# Patient Record
Sex: Female | Born: 1940 | Race: White | Hispanic: No | Smoking: Never smoker
Health system: Southern US, Community
[De-identification: ages and names within clinical notes are randomized; demographics above are authoritative.]

## PROBLEM LIST (undated history)

## (undated) DIAGNOSIS — M797 Fibromyalgia: Secondary | ICD-10-CM

## (undated) DIAGNOSIS — D303 Benign neoplasm of bladder: Secondary | ICD-10-CM

## (undated) DIAGNOSIS — Z87442 Personal history of urinary calculi: Secondary | ICD-10-CM

## (undated) DIAGNOSIS — R519 Headache, unspecified: Secondary | ICD-10-CM

## (undated) DIAGNOSIS — R51 Headache: Secondary | ICD-10-CM

## (undated) DIAGNOSIS — H47099 Other disorders of optic nerve, not elsewhere classified, unspecified eye: Secondary | ICD-10-CM

## (undated) DIAGNOSIS — E559 Vitamin D deficiency, unspecified: Secondary | ICD-10-CM

## (undated) DIAGNOSIS — I10 Essential (primary) hypertension: Secondary | ICD-10-CM

## (undated) DIAGNOSIS — J45909 Unspecified asthma, uncomplicated: Secondary | ICD-10-CM

## (undated) DIAGNOSIS — M1990 Unspecified osteoarthritis, unspecified site: Secondary | ICD-10-CM

## (undated) DIAGNOSIS — K219 Gastro-esophageal reflux disease without esophagitis: Secondary | ICD-10-CM

## (undated) DIAGNOSIS — F329 Major depressive disorder, single episode, unspecified: Secondary | ICD-10-CM

## (undated) DIAGNOSIS — N200 Calculus of kidney: Secondary | ICD-10-CM

## (undated) DIAGNOSIS — K449 Diaphragmatic hernia without obstruction or gangrene: Secondary | ICD-10-CM

## (undated) DIAGNOSIS — B029 Zoster without complications: Secondary | ICD-10-CM

## (undated) DIAGNOSIS — G510 Bell's palsy: Secondary | ICD-10-CM

## (undated) DIAGNOSIS — F32A Depression, unspecified: Secondary | ICD-10-CM

## (undated) HISTORY — DX: Fibromyalgia: M79.7

## (undated) HISTORY — PX: CYST EXCISION: SHX5701

## (undated) HISTORY — DX: Gastro-esophageal reflux disease without esophagitis: K21.9

## (undated) HISTORY — PX: ESOPHAGOGASTRODUODENOSCOPY: SHX1529

## (undated) HISTORY — PX: COLONOSCOPY: SHX174

## (undated) HISTORY — DX: Headache, unspecified: R51.9

## (undated) HISTORY — DX: Other disorders of optic nerve, not elsewhere classified, unspecified eye: H47.099

## (undated) HISTORY — PX: KNEE SURGERY: SHX244

## (undated) HISTORY — DX: Diaphragmatic hernia without obstruction or gangrene: K44.9

## (undated) HISTORY — DX: Bell's palsy: G51.0

## (undated) HISTORY — DX: Hypercalcemia: E83.52

## (undated) HISTORY — PX: SIGMOIDOSCOPY: SHX6686

## (undated) HISTORY — PX: EYE SURGERY: SHX253

## (undated) HISTORY — PX: REFRACTIVE SURGERY: SHX103

## (undated) HISTORY — PX: APPENDECTOMY: SHX54

## (undated) HISTORY — DX: Headache: R51

## (undated) HISTORY — DX: Unspecified asthma, uncomplicated: J45.909

## (undated) NOTE — *Deleted (*Deleted)
07/06/2020 5:18 PM   Anne Steele [birth year] [id number]  Referring provider: Barbette Reichmann, MD [id number] [facility] [place],  [place] [address] No chief complaint on file.   HPI: Anne Steele is a 60 y.o. female who returns a 3 month follow up of urge incontinence and vaginal atrophy.   Dysuria Cystoscopy 03/2019 with Dr. Apolinar Junes was NED.    Urge incontinence During the last visit the patient stated that she had urinary incontinence for a few years.  She was having urge incontinence, but she was not having SUI.  She was wearing pads daily.  Her incontinence volume was large.   She was wearing one to pads pads/depends daily. She was drinking five glasses of water daily.   She was drinking 2 to 3 Dr. Art Buff daily.  She was drinking sweet tea as well.  She was not drinking alcoholic beverages daily.  Gemtesa 75 mg helped.    Today the patient is  experiencing urgency x *** (1-2), frequency x *** (4-7), not/is restricting fluids to avoid visits to the restroom ***, not/is engaging in toilet mapping, incontinence x *** (0-3) and nocturia x *** (0-3).   Her BP is ***.   Her PVR is ***.  Previous PVR 0 mL.   She is post menopausal.  Vaginal atrophy She is applying the vaginal cream as regular as she can***.      PMH: Past Medical History:  Diagnosis Date  . Arthritis   . Asthma   . Bell palsy 1973  . Bell's palsy   . Benign bladder tumor   . Depression   . Fibromyalgia   . GERD (gastroesophageal reflux disease)   . Head ache   . Hiatal hernia   . History of kidney stones   . Hypercalcemia   . Hypertension   . Kidney stones   . Optic nerve disease   . Shingles   . Vitamin D deficiency     Surgical History: Past Surgical History:  Procedure Laterality Date  . ABDOMINAL HYSTERECTOMY  1982  . APPENDECTOMY    . ARTERY BIOPSY N/A 02/24/2020   Procedure: BIOPSY TEMPORAL ARTERY;  Surgeon: Sung Amabile, DO;  Location: [facility] ORS;   Service: General;  Laterality: N/A;  . BLADDER TUMOR EXCISION  08/2018  . BREAST BIOPSY Right 03/04/2016   US biopsy/ INTRADUCTAL PAPILLOMA  . BREAST BIOPSY Left 03/04/2016   Fibroadenoma  . BREAST EXCISIONAL BIOPSY Left 03/05/2019   Inflamed sebaceous cyst of the left breast, excised by dr. Lemar Livings in office BENIGN EPIDERMAL INCLUSION CYST WITH POSSIBLE RUPTURE AND ASSOCIATED INFLAMMATORY RESPONSE  . CARPAL TUNNEL RELEASE Bilateral 2012  . CATARACT EXTRACTION Bilateral 2017  . CHOLECYSTECTOMY  2001  . COLONOSCOPY    . CYST EXCISION     FINGER  . CYSTOSCOPY W/ RETROGRADES Bilateral 08/29/2018   Procedure: CYSTOSCOPY WITH RETROGRADE PYELOGRAM;  Surgeon: Vanna Scotland, MD;  Location: [facility] ORS;  Service: Urology;  Laterality: Bilateral;  . CYSTOSCOPY WITH BIOPSY N/A 08/29/2018   Procedure: CYSTOSCOPY WITH Bladder BIOPSY;  Surgeon: Vanna Scotland, MD;  Location: [facility] ORS;  Service: Urology;  Laterality: N/A;  . ESOPHAGOGASTRODUODENOSCOPY    . EYE SURGERY    . KNEE SURGERY Left 2012  . REFRACTIVE SURGERY Bilateral   . SIGMOIDOSCOPY      Home Medications:  Allergies as of 07/06/2020      Reactions   Ivp Dye [iodinated Diagnostic Agents] Anaphylaxis   Penicillins Shortness Of Breath   Patient  states it "brings her asthma back on"   Duloxetine Other (See Comments)   nightmares   Iodine Itching   Latanoprost    Blood shot eyes   Tetracyclines & Related Swelling   Trusopt [dorzolamide Hcl]    Zocor [simvastatin]    MUSCLE CRAMPS   Zovirax [acyclovir]    Betadine [povidone Iodine] Rash   Macrobid [nitrofurantoin] Rash   Sulfa Antibiotics Rash   Tape Rash      Medication List       Accurate as of July 05, 2020  5:18 PM. If you have any questions, ask your nurse or doctor.        acetaminophen 500 MG tablet Commonly known as: TYLENOL Take 1,000 mg by mouth 2 (two) times daily.   allopurinol 100 MG tablet Commonly known as: ZYLOPRIM Take 100 mg by mouth daily.    busPIRone 10 MG tablet Commonly known as: BUSPAR Take 10 mg by mouth 2 (two) times daily.   cyanocobalamin 1000 MCG tablet Take 100 mcg by mouth daily.   enalapril 20 MG tablet Commonly known as: VASOTEC Take 20 mg by mouth daily.   fluticasone 50 MCG/ACT nasal spray Commonly known as: FLONASE Place 2 sprays into both nostrils daily.   Gemtesa 75 MG Tabs Generic drug: Vibegron Take 75 mg by mouth daily.   Gemtesa 75 MG Tabs Generic drug: Vibegron Take 75 mg by mouth daily.   HYDROcodone-acetaminophen 5-325 MG tablet Commonly known as: Norco Take 1 tablet by mouth every 6 (six) hours as needed for up to 6 doses for moderate pain.   ibuprofen 400 MG tablet Commonly known as: ADVIL Take 1 tablet (400 mg total) by mouth every 8 (eight) hours as needed for mild pain or moderate pain.   loratadine 10 MG tablet Commonly known as: CLARITIN Take 10 mg by mouth at bedtime.   magnesium oxide 400 (241.3 Mg) MG tablet Commonly known as: MAG-OX Take 400 mg by mouth daily.   metoprolol succinate 25 MG 24 hr tablet Commonly known as: TOPROL-XL Take 25 mg by mouth daily.   omeprazole 20 MG capsule Commonly known as: PRILOSEC Take 20 mg by mouth 2 (two) times daily before a meal.   oxybutynin 5 MG tablet Commonly known as: DITROPAN Take 1 tablet by mouth daily as needed.   Premarin vaginal cream Generic drug: conjugated estrogens USE PEA SIZED AMOUNT VAGINALLY ON MONDAY, WEDNESDAY AND FRIDAY ONLY BEFORE BEDTIME.   Systane Nighttime Oint Apply 1 application to eye at bedtime. Both eyes   tiZANidine 4 MG tablet Commonly known as: ZANAFLEX Take 4 mg by mouth every 8 (eight) hours as needed for muscle spasms.   topiramate 25 MG tablet Commonly known as: TOPAMAX Take 25 mg by mouth daily.   Travoprost (BAK Free) 0.004 % Soln ophthalmic solution Commonly known as: TRAVATAN Place 1 drop into both eyes at bedtime.   vitamin E 180 MG (400 UNITS) capsule Generic drug:  vitamin E Take 400 Units by mouth daily.       Allergies:  Allergies  Allergen Reactions  . Ivp Dye [Iodinated Diagnostic Agents] Anaphylaxis  . Penicillins Shortness Of Breath    Patient states it "brings her asthma back on"  . Duloxetine Other (See Comments)    nightmares  . Iodine Itching  . Latanoprost     Blood shot eyes  . Tetracyclines & Related Swelling  . Trusopt [Dorzolamide Hcl]   . Zocor [Simvastatin]     MUSCLE CRAMPS  .  Zovirax [Acyclovir]   . Betadine [Povidone Iodine] Rash  . Macrobid [Nitrofurantoin] Rash  . Sulfa Antibiotics Rash  . Tape Rash    Family History: Family History  Problem Relation Age of Onset  . Leukemia Mother   . Diabetes Father   . Breast cancer Sister   . Brain cancer Brother   . Diabetes Sister   . Diabetes Brother   . Prostate cancer Neg Hx   . Kidney cancer Neg Hx   . Bladder Cancer Neg Hx     Social History:  reports that she has never smoked. She has never used smokeless tobacco. She reports that she does not drink alcohol and does not use drugs.   Physical Exam: There were no vitals taken for this visit.  Constitutional:  Alert and oriented, No acute distress. HEENT: [place] AT, moist mucus membranes.  Trachea midline, no masses. Cardiovascular: No clubbing, cyanosis, or edema. Respiratory: Normal respiratory effort, no increased work of breathing. GI: Abdomen is soft, nontender, nondistended, no abdominal masses GU: No CVA tenderness Lymph: No cervical or inguinal lymphadenopathy. Skin: No rashes, bruises or suspicious lesions. Neurologic: Grossly intact, no focal deficits, moving all 4 extremities. Psychiatric: Normal mood and affect.  Laboratory Data:  Lab Results  Component Value Date   CREATININE 1.10 (H) 02/21/2020    No results found for: PSA  No results found for: TESTOSTERONE  No results found for: HGBA1C  Urinalysis   Pertinent Imaging: *** No results found for this or any previous visit.  No  results found for this or any previous visit.  No results found for this or any previous visit.  No results found for this or any previous visit.  Results for orders placed during the hospital encounter of 05/30/18  US RENAL  Narrative CLINICAL DATA:  Current history of BILATERAL nephrolithiasis. Known BILATERAL renal cysts.  EXAM: RENAL / URINARY TRACT ULTRASOUND COMPLETE  COMPARISON:  Lumbar MRI 02/15/2018. CT abdomen and pelvis 10/21/2016, 08/26/2015 and earlier. Urinary tract ultrasound 07/25/2014.  FINDINGS: Right Kidney:  Length: Approximately 9.6 cm. No hydronephrosis. 7 mm shadowing calculus in an UPPER pole calyx. Adjacent anechoic masses involving the mid kidney measuring approximately 1.8 cm and 1.1 cm respectively. Anechoic mass arising from the LOWER pole measuring approximately 0.9 cm. No solid masses.  Left Kidney:  Length: Approximately 9.9 cm. No hydronephrosis. No visible shadowing calculi. Multiple anechoic masses, the largest arising from the mid kidney measuring approximately 4.1 cm and from the UPPER pole measuring approximately 2.8 cm.  Bladder:  Relatively decompressed. BILATERAL ureteral jets visible at Doppler evaluation. Layering debris within the bladder.  IMPRESSION: 1. Multiple BILATERAL renal cysts as noted on prior imaging. 2. Non-obstructing 7 mm calculus in an UPPER pole calyx of the RIGHT kidney. Previously identified LEFT renal calculi are not visible on today's ultrasound. 3. Layering debris in the urinary bladder, possibly indicating infection. Please correlate with urinalysis.   Electronically Signed By: Hulan Saas M.D. On: 05/30/2018 16:37  No results found for this or any previous visit.  No results found for this or any previous visit.  Results for orders placed during the hospital encounter of 10/21/16  CT RENAL STONE STUDY  Narrative CLINICAL DATA:  Left flank pain.  Dysuria.  EXAM: CT ABDOMEN AND  PELVIS WITHOUT CONTRAST  TECHNIQUE: Multidetector CT imaging of the abdomen and pelvis was performed following the standard protocol without IV contrast.  COMPARISON:  08/26/2015  FINDINGS: Lower chest: Clear lung bases. Normal heart size without  pericardial or pleural effusion.  Hepatobiliary: Mild hepatic steatosis. Caudate lobe enlargement, without specific evidence of cirrhosis. Cholecystectomy, without biliary ductal dilatation.  Pancreas: Normal, without mass or ductal dilatation.  Spleen: Normal in size, without focal abnormality.  Adrenals/Urinary Tract: Normal adrenal glands. Bilateral renal collecting system calculi, including an 8 mm stone in the lower pole left kidney. No hydronephrosis. No hydroureter or ureteric calculi. No bladder calculi.  An upper pole left renal lesion measures greater than fluid density and 2.4 cm on image 22/ series 2. This lesion was similar in size and fluid density on the prior, favoring a complex cyst today.  An interpolar left renal hyperattenuating lesion is similar at 1.3 cm. 7 mm lower pole left renal hyperattenuating lesion is also similar. Low-density left renal lesions are also identified and likely cysts. Minimal calcification associated with upper pole left renal lesions, favoring complexity. Similar.  Stomach/Bowel: Normal stomach, without wall thickening. Colonic stool burden suggests constipation. Normal terminal ileum. Normal small bowel.  Vascular/Lymphatic: Aortic and branch vessel atherosclerosis. No abdominopelvic adenopathy.  Reproductive: Hysterectomy.  No adnexal mass.  Other: No significant free fluid.  Musculoskeletal: Lumbosacral spondylosis with advanced degenerative disc disease at the lumbosacral junction.  IMPRESSION: 1. Bilateral nephrolithiasis, without obstructive uropathy. 2.  Possible constipation. 3. Multiple renal lesions which are likely cysts and complex cysts. although these are  incompletely characterized on this noncontrast exam, relative stability favors benign or indolent lesions. 4. Hepatic steatosis. 5.  Aortic atherosclerosis.   Electronically Signed By: Jeronimo Greaves M.D. On: 10/21/2016 12:16   Assessment & Plan:    1. Urge incontinence  2. Vaginal atrophy   No follow-ups on file.  [facility] [address] [place], [place] [address] [phone]  I, Theador Hawthorne, am acting as a Neurosurgeon for [employer],  {Add [employer] Statement}

---

## 1971-09-20 DIAGNOSIS — G510 Bell's palsy: Secondary | ICD-10-CM

## 1971-09-20 HISTORY — DX: Bell's palsy: G51.0

## 1980-09-19 HISTORY — PX: ABDOMINAL HYSTERECTOMY: SHX81

## 1999-09-20 HISTORY — PX: CHOLECYSTECTOMY: SHX55

## 2004-07-29 ENCOUNTER — Ambulatory Visit: Payer: Self-pay | Admitting: Urology

## 2004-09-07 ENCOUNTER — Ambulatory Visit: Payer: Self-pay | Admitting: Internal Medicine

## 2005-02-11 ENCOUNTER — Ambulatory Visit: Payer: Self-pay | Admitting: Unknown Physician Specialty

## 2005-06-24 ENCOUNTER — Ambulatory Visit: Payer: Self-pay | Admitting: Urology

## 2005-09-14 ENCOUNTER — Ambulatory Visit: Payer: Self-pay | Admitting: Internal Medicine

## 2005-09-15 ENCOUNTER — Ambulatory Visit: Payer: Self-pay | Admitting: Rheumatology

## 2006-01-06 ENCOUNTER — Ambulatory Visit: Payer: Self-pay | Admitting: Unknown Physician Specialty

## 2006-01-13 ENCOUNTER — Other Ambulatory Visit: Payer: Self-pay

## 2006-01-13 ENCOUNTER — Ambulatory Visit: Payer: Self-pay | Admitting: Unknown Physician Specialty

## 2006-05-08 ENCOUNTER — Ambulatory Visit: Payer: Self-pay

## 2006-06-23 ENCOUNTER — Ambulatory Visit: Payer: Self-pay | Admitting: Unknown Physician Specialty

## 2006-06-23 ENCOUNTER — Other Ambulatory Visit: Payer: Self-pay

## 2006-06-29 ENCOUNTER — Ambulatory Visit: Payer: Self-pay | Admitting: Unknown Physician Specialty

## 2006-09-18 ENCOUNTER — Ambulatory Visit: Payer: Self-pay | Admitting: Internal Medicine

## 2007-05-04 ENCOUNTER — Ambulatory Visit: Payer: Self-pay

## 2007-10-18 ENCOUNTER — Ambulatory Visit: Payer: Self-pay | Admitting: Internal Medicine

## 2008-03-12 ENCOUNTER — Ambulatory Visit: Payer: Self-pay | Admitting: Urology

## 2008-10-30 ENCOUNTER — Ambulatory Visit: Payer: Self-pay | Admitting: Internal Medicine

## 2008-11-03 ENCOUNTER — Ambulatory Visit: Payer: Self-pay | Admitting: Internal Medicine

## 2009-01-13 ENCOUNTER — Ambulatory Visit: Payer: Self-pay | Admitting: Rheumatology

## 2009-04-24 ENCOUNTER — Emergency Department: Payer: Self-pay | Admitting: Emergency Medicine

## 2009-04-29 ENCOUNTER — Ambulatory Visit: Payer: Self-pay | Admitting: Unknown Physician Specialty

## 2009-05-04 ENCOUNTER — Ambulatory Visit: Payer: Self-pay | Admitting: Internal Medicine

## 2009-12-15 ENCOUNTER — Ambulatory Visit: Payer: Self-pay | Admitting: Urology

## 2010-03-25 ENCOUNTER — Ambulatory Visit: Payer: Self-pay | Admitting: Internal Medicine

## 2010-09-19 HISTORY — PX: CARPAL TUNNEL RELEASE: SHX101

## 2010-09-19 HISTORY — PX: KNEE SURGERY: SHX244

## 2010-12-31 ENCOUNTER — Ambulatory Visit: Payer: Self-pay

## 2011-01-07 ENCOUNTER — Ambulatory Visit: Payer: Self-pay | Admitting: Unknown Physician Specialty

## 2011-03-28 ENCOUNTER — Ambulatory Visit: Payer: Self-pay | Admitting: Internal Medicine

## 2011-11-16 DIAGNOSIS — E559 Vitamin D deficiency, unspecified: Secondary | ICD-10-CM | POA: Diagnosis not present

## 2011-11-16 DIAGNOSIS — I10 Essential (primary) hypertension: Secondary | ICD-10-CM | POA: Diagnosis not present

## 2011-11-16 DIAGNOSIS — E785 Hyperlipidemia, unspecified: Secondary | ICD-10-CM | POA: Diagnosis not present

## 2011-12-02 DIAGNOSIS — H35389 Toxic maculopathy, unspecified eye: Secondary | ICD-10-CM | POA: Diagnosis not present

## 2011-12-20 DIAGNOSIS — E785 Hyperlipidemia, unspecified: Secondary | ICD-10-CM | POA: Diagnosis not present

## 2011-12-20 DIAGNOSIS — Z0000 Encounter for general adult medical examination without abnormal findings: Secondary | ICD-10-CM | POA: Diagnosis not present

## 2011-12-20 DIAGNOSIS — Z79899 Other long term (current) drug therapy: Secondary | ICD-10-CM | POA: Diagnosis not present

## 2011-12-20 DIAGNOSIS — E119 Type 2 diabetes mellitus without complications: Secondary | ICD-10-CM | POA: Diagnosis not present

## 2011-12-20 DIAGNOSIS — F4320 Adjustment disorder, unspecified: Secondary | ICD-10-CM | POA: Diagnosis not present

## 2011-12-20 DIAGNOSIS — I10 Essential (primary) hypertension: Secondary | ICD-10-CM | POA: Diagnosis not present

## 2012-01-30 DIAGNOSIS — I10 Essential (primary) hypertension: Secondary | ICD-10-CM | POA: Diagnosis not present

## 2012-01-30 DIAGNOSIS — E119 Type 2 diabetes mellitus without complications: Secondary | ICD-10-CM | POA: Diagnosis not present

## 2012-01-30 DIAGNOSIS — Z79899 Other long term (current) drug therapy: Secondary | ICD-10-CM | POA: Diagnosis not present

## 2012-01-30 DIAGNOSIS — M25519 Pain in unspecified shoulder: Secondary | ICD-10-CM | POA: Diagnosis not present

## 2012-02-09 DIAGNOSIS — E782 Mixed hyperlipidemia: Secondary | ICD-10-CM | POA: Diagnosis not present

## 2012-02-09 DIAGNOSIS — R9431 Abnormal electrocardiogram [ECG] [EKG]: Secondary | ICD-10-CM | POA: Diagnosis not present

## 2012-02-09 DIAGNOSIS — I209 Angina pectoris, unspecified: Secondary | ICD-10-CM | POA: Diagnosis not present

## 2012-02-09 DIAGNOSIS — I10 Essential (primary) hypertension: Secondary | ICD-10-CM | POA: Diagnosis not present

## 2012-02-16 DIAGNOSIS — I359 Nonrheumatic aortic valve disorder, unspecified: Secondary | ICD-10-CM | POA: Diagnosis not present

## 2012-02-16 DIAGNOSIS — I209 Angina pectoris, unspecified: Secondary | ICD-10-CM | POA: Diagnosis not present

## 2012-02-23 DIAGNOSIS — I10 Essential (primary) hypertension: Secondary | ICD-10-CM | POA: Diagnosis not present

## 2012-02-23 DIAGNOSIS — I495 Sick sinus syndrome: Secondary | ICD-10-CM | POA: Diagnosis not present

## 2012-02-23 DIAGNOSIS — E782 Mixed hyperlipidemia: Secondary | ICD-10-CM | POA: Diagnosis not present

## 2012-02-23 DIAGNOSIS — I059 Rheumatic mitral valve disease, unspecified: Secondary | ICD-10-CM | POA: Diagnosis not present

## 2012-03-28 ENCOUNTER — Ambulatory Visit: Payer: Self-pay | Admitting: Internal Medicine

## 2012-03-28 DIAGNOSIS — Z1231 Encounter for screening mammogram for malignant neoplasm of breast: Secondary | ICD-10-CM | POA: Diagnosis not present

## 2012-04-05 DIAGNOSIS — I059 Rheumatic mitral valve disease, unspecified: Secondary | ICD-10-CM | POA: Diagnosis not present

## 2012-04-05 DIAGNOSIS — E782 Mixed hyperlipidemia: Secondary | ICD-10-CM | POA: Diagnosis not present

## 2012-04-05 DIAGNOSIS — I409 Acute myocarditis, unspecified: Secondary | ICD-10-CM | POA: Diagnosis not present

## 2012-04-18 DIAGNOSIS — M1710 Unilateral primary osteoarthritis, unspecified knee: Secondary | ICD-10-CM | POA: Diagnosis not present

## 2012-05-09 DIAGNOSIS — N952 Postmenopausal atrophic vaginitis: Secondary | ICD-10-CM | POA: Diagnosis not present

## 2012-05-09 DIAGNOSIS — R300 Dysuria: Secondary | ICD-10-CM | POA: Diagnosis not present

## 2012-05-09 DIAGNOSIS — N281 Cyst of kidney, acquired: Secondary | ICD-10-CM | POA: Diagnosis not present

## 2012-05-16 ENCOUNTER — Ambulatory Visit: Payer: Self-pay | Admitting: Urology

## 2012-05-16 DIAGNOSIS — N200 Calculus of kidney: Secondary | ICD-10-CM | POA: Diagnosis not present

## 2012-05-16 DIAGNOSIS — N281 Cyst of kidney, acquired: Secondary | ICD-10-CM | POA: Diagnosis not present

## 2012-05-16 DIAGNOSIS — Q619 Cystic kidney disease, unspecified: Secondary | ICD-10-CM | POA: Diagnosis not present

## 2012-05-23 DIAGNOSIS — N952 Postmenopausal atrophic vaginitis: Secondary | ICD-10-CM | POA: Diagnosis not present

## 2012-05-23 DIAGNOSIS — R300 Dysuria: Secondary | ICD-10-CM | POA: Diagnosis not present

## 2012-05-23 DIAGNOSIS — N281 Cyst of kidney, acquired: Secondary | ICD-10-CM | POA: Diagnosis not present

## 2012-06-18 DIAGNOSIS — Z23 Encounter for immunization: Secondary | ICD-10-CM | POA: Diagnosis not present

## 2012-06-21 DIAGNOSIS — M1710 Unilateral primary osteoarthritis, unspecified knee: Secondary | ICD-10-CM | POA: Diagnosis not present

## 2012-06-22 DIAGNOSIS — Z79899 Other long term (current) drug therapy: Secondary | ICD-10-CM | POA: Diagnosis not present

## 2012-06-22 DIAGNOSIS — E785 Hyperlipidemia, unspecified: Secondary | ICD-10-CM | POA: Diagnosis not present

## 2012-06-22 DIAGNOSIS — E119 Type 2 diabetes mellitus without complications: Secondary | ICD-10-CM | POA: Diagnosis not present

## 2012-06-27 DIAGNOSIS — R12 Heartburn: Secondary | ICD-10-CM | POA: Diagnosis not present

## 2012-06-27 DIAGNOSIS — J019 Acute sinusitis, unspecified: Secondary | ICD-10-CM | POA: Diagnosis not present

## 2012-06-27 DIAGNOSIS — F329 Major depressive disorder, single episode, unspecified: Secondary | ICD-10-CM | POA: Diagnosis not present

## 2012-06-27 DIAGNOSIS — I10 Essential (primary) hypertension: Secondary | ICD-10-CM | POA: Diagnosis not present

## 2012-07-02 DIAGNOSIS — H04129 Dry eye syndrome of unspecified lacrimal gland: Secondary | ICD-10-CM | POA: Diagnosis not present

## 2012-07-02 DIAGNOSIS — H35389 Toxic maculopathy, unspecified eye: Secondary | ICD-10-CM | POA: Diagnosis not present

## 2012-09-04 DIAGNOSIS — M79609 Pain in unspecified limb: Secondary | ICD-10-CM | POA: Diagnosis not present

## 2012-09-04 DIAGNOSIS — E1149 Type 2 diabetes mellitus with other diabetic neurological complication: Secondary | ICD-10-CM | POA: Diagnosis not present

## 2012-09-04 DIAGNOSIS — E1142 Type 2 diabetes mellitus with diabetic polyneuropathy: Secondary | ICD-10-CM | POA: Diagnosis not present

## 2012-10-02 DIAGNOSIS — E1142 Type 2 diabetes mellitus with diabetic polyneuropathy: Secondary | ICD-10-CM | POA: Diagnosis not present

## 2012-10-02 DIAGNOSIS — Z713 Dietary counseling and surveillance: Secondary | ICD-10-CM | POA: Diagnosis not present

## 2012-10-02 DIAGNOSIS — I471 Supraventricular tachycardia: Secondary | ICD-10-CM | POA: Diagnosis not present

## 2012-10-02 DIAGNOSIS — M79609 Pain in unspecified limb: Secondary | ICD-10-CM | POA: Diagnosis not present

## 2012-10-02 DIAGNOSIS — R609 Edema, unspecified: Secondary | ICD-10-CM | POA: Diagnosis not present

## 2012-10-02 DIAGNOSIS — I10 Essential (primary) hypertension: Secondary | ICD-10-CM | POA: Diagnosis not present

## 2012-10-24 DIAGNOSIS — D230 Other benign neoplasm of skin of lip: Secondary | ICD-10-CM | POA: Diagnosis not present

## 2012-10-24 DIAGNOSIS — D2330 Other benign neoplasm of skin of unspecified part of face: Secondary | ICD-10-CM | POA: Diagnosis not present

## 2012-10-24 DIAGNOSIS — L821 Other seborrheic keratosis: Secondary | ICD-10-CM | POA: Diagnosis not present

## 2012-10-24 DIAGNOSIS — L723 Sebaceous cyst: Secondary | ICD-10-CM | POA: Diagnosis not present

## 2012-10-24 DIAGNOSIS — L739 Follicular disorder, unspecified: Secondary | ICD-10-CM | POA: Diagnosis not present

## 2012-11-07 DIAGNOSIS — D485 Neoplasm of uncertain behavior of skin: Secondary | ICD-10-CM | POA: Diagnosis not present

## 2012-11-07 DIAGNOSIS — L570 Actinic keratosis: Secondary | ICD-10-CM | POA: Diagnosis not present

## 2012-11-08 DIAGNOSIS — R002 Palpitations: Secondary | ICD-10-CM | POA: Diagnosis not present

## 2012-11-08 DIAGNOSIS — I10 Essential (primary) hypertension: Secondary | ICD-10-CM | POA: Diagnosis not present

## 2012-11-08 DIAGNOSIS — E785 Hyperlipidemia, unspecified: Secondary | ICD-10-CM | POA: Diagnosis not present

## 2012-11-08 DIAGNOSIS — I471 Supraventricular tachycardia: Secondary | ICD-10-CM | POA: Diagnosis not present

## 2012-12-05 DIAGNOSIS — D485 Neoplasm of uncertain behavior of skin: Secondary | ICD-10-CM | POA: Diagnosis not present

## 2012-12-18 DIAGNOSIS — D485 Neoplasm of uncertain behavior of skin: Secondary | ICD-10-CM | POA: Diagnosis not present

## 2012-12-18 DIAGNOSIS — L909 Atrophic disorder of skin, unspecified: Secondary | ICD-10-CM | POA: Diagnosis not present

## 2012-12-18 DIAGNOSIS — L919 Hypertrophic disorder of the skin, unspecified: Secondary | ICD-10-CM | POA: Diagnosis not present

## 2012-12-19 DIAGNOSIS — I10 Essential (primary) hypertension: Secondary | ICD-10-CM | POA: Diagnosis not present

## 2012-12-31 DIAGNOSIS — H35389 Toxic maculopathy, unspecified eye: Secondary | ICD-10-CM | POA: Diagnosis not present

## 2013-01-15 DIAGNOSIS — I10 Essential (primary) hypertension: Secondary | ICD-10-CM | POA: Diagnosis not present

## 2013-01-15 DIAGNOSIS — Z0000 Encounter for general adult medical examination without abnormal findings: Secondary | ICD-10-CM | POA: Diagnosis not present

## 2013-03-29 ENCOUNTER — Ambulatory Visit: Payer: Self-pay

## 2013-03-29 DIAGNOSIS — Z1231 Encounter for screening mammogram for malignant neoplasm of breast: Secondary | ICD-10-CM | POA: Diagnosis not present

## 2013-03-29 DIAGNOSIS — Z803 Family history of malignant neoplasm of breast: Secondary | ICD-10-CM | POA: Diagnosis not present

## 2013-04-03 DIAGNOSIS — M659 Synovitis and tenosynovitis, unspecified: Secondary | ICD-10-CM | POA: Diagnosis not present

## 2013-04-03 DIAGNOSIS — M79609 Pain in unspecified limb: Secondary | ICD-10-CM | POA: Diagnosis not present

## 2013-04-03 DIAGNOSIS — Q6689 Other  specified congenital deformities of feet: Secondary | ICD-10-CM | POA: Diagnosis not present

## 2013-05-07 DIAGNOSIS — I10 Essential (primary) hypertension: Secondary | ICD-10-CM | POA: Diagnosis not present

## 2013-05-07 DIAGNOSIS — E785 Hyperlipidemia, unspecified: Secondary | ICD-10-CM | POA: Diagnosis not present

## 2013-05-07 DIAGNOSIS — I059 Rheumatic mitral valve disease, unspecified: Secondary | ICD-10-CM | POA: Diagnosis not present

## 2013-06-13 DIAGNOSIS — K589 Irritable bowel syndrome without diarrhea: Secondary | ICD-10-CM | POA: Diagnosis not present

## 2013-06-13 DIAGNOSIS — R141 Gas pain: Secondary | ICD-10-CM | POA: Diagnosis not present

## 2013-06-19 DIAGNOSIS — R197 Diarrhea, unspecified: Secondary | ICD-10-CM | POA: Diagnosis not present

## 2013-06-19 DIAGNOSIS — L570 Actinic keratosis: Secondary | ICD-10-CM | POA: Diagnosis not present

## 2013-06-19 DIAGNOSIS — R109 Unspecified abdominal pain: Secondary | ICD-10-CM | POA: Diagnosis not present

## 2013-06-19 DIAGNOSIS — D2110 Benign neoplasm of connective and other soft tissue of unspecified upper limb, including shoulder: Secondary | ICD-10-CM | POA: Diagnosis not present

## 2013-06-19 DIAGNOSIS — D485 Neoplasm of uncertain behavior of skin: Secondary | ICD-10-CM | POA: Diagnosis not present

## 2013-06-19 DIAGNOSIS — K589 Irritable bowel syndrome without diarrhea: Secondary | ICD-10-CM | POA: Diagnosis not present

## 2013-06-19 DIAGNOSIS — Z1283 Encounter for screening for malignant neoplasm of skin: Secondary | ICD-10-CM | POA: Diagnosis not present

## 2013-06-19 DIAGNOSIS — D2360 Other benign neoplasm of skin of unspecified upper limb, including shoulder: Secondary | ICD-10-CM | POA: Diagnosis not present

## 2013-06-19 DIAGNOSIS — L821 Other seborrheic keratosis: Secondary | ICD-10-CM | POA: Diagnosis not present

## 2013-07-10 DIAGNOSIS — Z0000 Encounter for general adult medical examination without abnormal findings: Secondary | ICD-10-CM | POA: Diagnosis not present

## 2013-07-10 DIAGNOSIS — I10 Essential (primary) hypertension: Secondary | ICD-10-CM | POA: Diagnosis not present

## 2013-07-10 DIAGNOSIS — E119 Type 2 diabetes mellitus without complications: Secondary | ICD-10-CM | POA: Diagnosis not present

## 2013-07-10 DIAGNOSIS — Z23 Encounter for immunization: Secondary | ICD-10-CM | POA: Diagnosis not present

## 2013-07-17 DIAGNOSIS — K589 Irritable bowel syndrome without diarrhea: Secondary | ICD-10-CM | POA: Diagnosis not present

## 2013-07-17 DIAGNOSIS — G609 Hereditary and idiopathic neuropathy, unspecified: Secondary | ICD-10-CM | POA: Diagnosis not present

## 2013-07-17 DIAGNOSIS — I10 Essential (primary) hypertension: Secondary | ICD-10-CM | POA: Diagnosis not present

## 2013-07-17 DIAGNOSIS — F329 Major depressive disorder, single episode, unspecified: Secondary | ICD-10-CM | POA: Diagnosis not present

## 2013-07-19 DIAGNOSIS — N200 Calculus of kidney: Secondary | ICD-10-CM | POA: Diagnosis not present

## 2013-07-19 DIAGNOSIS — R109 Unspecified abdominal pain: Secondary | ICD-10-CM | POA: Diagnosis not present

## 2013-08-01 ENCOUNTER — Ambulatory Visit: Payer: Self-pay | Admitting: Urology

## 2013-08-01 DIAGNOSIS — N200 Calculus of kidney: Secondary | ICD-10-CM | POA: Diagnosis not present

## 2013-08-01 DIAGNOSIS — R109 Unspecified abdominal pain: Secondary | ICD-10-CM | POA: Diagnosis not present

## 2013-08-05 DIAGNOSIS — R079 Chest pain, unspecified: Secondary | ICD-10-CM | POA: Diagnosis not present

## 2013-08-07 DIAGNOSIS — N289 Disorder of kidney and ureter, unspecified: Secondary | ICD-10-CM | POA: Diagnosis not present

## 2013-08-08 DIAGNOSIS — I059 Rheumatic mitral valve disease, unspecified: Secondary | ICD-10-CM | POA: Diagnosis not present

## 2013-08-08 DIAGNOSIS — I209 Angina pectoris, unspecified: Secondary | ICD-10-CM | POA: Diagnosis not present

## 2013-08-08 DIAGNOSIS — R0602 Shortness of breath: Secondary | ICD-10-CM | POA: Diagnosis not present

## 2013-08-08 DIAGNOSIS — I119 Hypertensive heart disease without heart failure: Secondary | ICD-10-CM | POA: Diagnosis not present

## 2013-08-14 DIAGNOSIS — I209 Angina pectoris, unspecified: Secondary | ICD-10-CM | POA: Diagnosis not present

## 2013-08-19 DIAGNOSIS — I517 Cardiomegaly: Secondary | ICD-10-CM | POA: Diagnosis not present

## 2013-08-19 DIAGNOSIS — R9389 Abnormal findings on diagnostic imaging of other specified body structures: Secondary | ICD-10-CM | POA: Diagnosis not present

## 2013-08-19 DIAGNOSIS — I119 Hypertensive heart disease without heart failure: Secondary | ICD-10-CM | POA: Diagnosis not present

## 2013-08-19 DIAGNOSIS — I059 Rheumatic mitral valve disease, unspecified: Secondary | ICD-10-CM | POA: Diagnosis not present

## 2013-08-21 ENCOUNTER — Ambulatory Visit: Payer: Self-pay | Admitting: Urology

## 2013-08-21 DIAGNOSIS — N289 Disorder of kidney and ureter, unspecified: Secondary | ICD-10-CM | POA: Diagnosis not present

## 2013-08-21 DIAGNOSIS — N281 Cyst of kidney, acquired: Secondary | ICD-10-CM | POA: Diagnosis not present

## 2013-08-21 DIAGNOSIS — Q619 Cystic kidney disease, unspecified: Secondary | ICD-10-CM | POA: Diagnosis not present

## 2013-08-21 DIAGNOSIS — Z9089 Acquired absence of other organs: Secondary | ICD-10-CM | POA: Diagnosis not present

## 2013-08-21 LAB — CREATININE, SERUM
Creatinine: 1.21 mg/dL (ref 0.60–1.30)
EGFR (Non-African Amer.): 45 — ABNORMAL LOW

## 2013-08-22 DIAGNOSIS — Q619 Cystic kidney disease, unspecified: Secondary | ICD-10-CM | POA: Diagnosis not present

## 2013-08-22 DIAGNOSIS — R300 Dysuria: Secondary | ICD-10-CM | POA: Diagnosis not present

## 2013-08-30 DIAGNOSIS — E559 Vitamin D deficiency, unspecified: Secondary | ICD-10-CM | POA: Diagnosis not present

## 2013-09-25 DIAGNOSIS — E8941 Symptomatic postprocedural ovarian failure: Secondary | ICD-10-CM | POA: Diagnosis not present

## 2013-10-01 DIAGNOSIS — Z86010 Personal history of colonic polyps: Secondary | ICD-10-CM | POA: Diagnosis not present

## 2013-10-01 DIAGNOSIS — K589 Irritable bowel syndrome without diarrhea: Secondary | ICD-10-CM | POA: Diagnosis not present

## 2013-12-23 DIAGNOSIS — E782 Mixed hyperlipidemia: Secondary | ICD-10-CM | POA: Diagnosis not present

## 2013-12-23 DIAGNOSIS — I119 Hypertensive heart disease without heart failure: Secondary | ICD-10-CM | POA: Diagnosis not present

## 2013-12-23 DIAGNOSIS — I059 Rheumatic mitral valve disease, unspecified: Secondary | ICD-10-CM | POA: Diagnosis not present

## 2013-12-23 DIAGNOSIS — R1084 Generalized abdominal pain: Secondary | ICD-10-CM | POA: Diagnosis not present

## 2013-12-31 DIAGNOSIS — M545 Low back pain, unspecified: Secondary | ICD-10-CM | POA: Diagnosis not present

## 2013-12-31 DIAGNOSIS — N952 Postmenopausal atrophic vaginitis: Secondary | ICD-10-CM | POA: Diagnosis not present

## 2013-12-31 DIAGNOSIS — N281 Cyst of kidney, acquired: Secondary | ICD-10-CM | POA: Diagnosis not present

## 2013-12-31 DIAGNOSIS — N3941 Urge incontinence: Secondary | ICD-10-CM | POA: Diagnosis not present

## 2014-02-14 ENCOUNTER — Ambulatory Visit: Payer: Self-pay | Admitting: Unknown Physician Specialty

## 2014-02-14 DIAGNOSIS — Z91041 Radiographic dye allergy status: Secondary | ICD-10-CM | POA: Diagnosis not present

## 2014-02-14 DIAGNOSIS — M1990 Unspecified osteoarthritis, unspecified site: Secondary | ICD-10-CM | POA: Diagnosis not present

## 2014-02-14 DIAGNOSIS — K589 Irritable bowel syndrome without diarrhea: Secondary | ICD-10-CM | POA: Diagnosis not present

## 2014-02-14 DIAGNOSIS — E119 Type 2 diabetes mellitus without complications: Secondary | ICD-10-CM | POA: Diagnosis not present

## 2014-02-14 DIAGNOSIS — I2510 Atherosclerotic heart disease of native coronary artery without angina pectoris: Secondary | ICD-10-CM | POA: Diagnosis not present

## 2014-02-14 DIAGNOSIS — Z880 Allergy status to penicillin: Secondary | ICD-10-CM | POA: Diagnosis not present

## 2014-02-14 DIAGNOSIS — K219 Gastro-esophageal reflux disease without esophagitis: Secondary | ICD-10-CM | POA: Diagnosis not present

## 2014-02-14 DIAGNOSIS — K648 Other hemorrhoids: Secondary | ICD-10-CM | POA: Diagnosis not present

## 2014-02-14 DIAGNOSIS — IMO0001 Reserved for inherently not codable concepts without codable children: Secondary | ICD-10-CM | POA: Diagnosis not present

## 2014-02-14 DIAGNOSIS — Z09 Encounter for follow-up examination after completed treatment for conditions other than malignant neoplasm: Secondary | ICD-10-CM | POA: Diagnosis not present

## 2014-02-14 DIAGNOSIS — D126 Benign neoplasm of colon, unspecified: Secondary | ICD-10-CM | POA: Diagnosis not present

## 2014-02-14 DIAGNOSIS — F329 Major depressive disorder, single episode, unspecified: Secondary | ICD-10-CM | POA: Diagnosis not present

## 2014-02-14 DIAGNOSIS — Z860100 Personal history of colon polyps, unspecified: Secondary | ICD-10-CM | POA: Diagnosis not present

## 2014-02-14 DIAGNOSIS — Z79899 Other long term (current) drug therapy: Secondary | ICD-10-CM | POA: Diagnosis not present

## 2014-02-14 DIAGNOSIS — Z87442 Personal history of urinary calculi: Secondary | ICD-10-CM | POA: Diagnosis not present

## 2014-02-14 DIAGNOSIS — F3289 Other specified depressive episodes: Secondary | ICD-10-CM | POA: Diagnosis not present

## 2014-02-14 DIAGNOSIS — Z882 Allergy status to sulfonamides status: Secondary | ICD-10-CM | POA: Diagnosis not present

## 2014-02-14 DIAGNOSIS — I10 Essential (primary) hypertension: Secondary | ICD-10-CM | POA: Diagnosis not present

## 2014-02-17 LAB — PATHOLOGY REPORT

## 2014-02-27 DIAGNOSIS — S46909A Unspecified injury of unspecified muscle, fascia and tendon at shoulder and upper arm level, unspecified arm, initial encounter: Secondary | ICD-10-CM | POA: Diagnosis not present

## 2014-02-27 DIAGNOSIS — S4980XA Other specified injuries of shoulder and upper arm, unspecified arm, initial encounter: Secondary | ICD-10-CM | POA: Diagnosis not present

## 2014-02-27 DIAGNOSIS — IMO0002 Reserved for concepts with insufficient information to code with codable children: Secondary | ICD-10-CM | POA: Diagnosis not present

## 2014-02-27 DIAGNOSIS — S43409A Unspecified sprain of unspecified shoulder joint, initial encounter: Secondary | ICD-10-CM | POA: Insufficient documentation

## 2014-03-28 DIAGNOSIS — H35389 Toxic maculopathy, unspecified eye: Secondary | ICD-10-CM | POA: Diagnosis not present

## 2014-05-01 DIAGNOSIS — Z87442 Personal history of urinary calculi: Secondary | ICD-10-CM | POA: Diagnosis not present

## 2014-05-01 DIAGNOSIS — F329 Major depressive disorder, single episode, unspecified: Secondary | ICD-10-CM | POA: Diagnosis not present

## 2014-05-01 DIAGNOSIS — E785 Hyperlipidemia, unspecified: Secondary | ICD-10-CM | POA: Diagnosis not present

## 2014-05-01 DIAGNOSIS — Z0000 Encounter for general adult medical examination without abnormal findings: Secondary | ICD-10-CM | POA: Diagnosis not present

## 2014-05-01 DIAGNOSIS — I10 Essential (primary) hypertension: Secondary | ICD-10-CM | POA: Insufficient documentation

## 2014-05-01 DIAGNOSIS — F3289 Other specified depressive episodes: Secondary | ICD-10-CM | POA: Diagnosis not present

## 2014-05-01 DIAGNOSIS — Z1239 Encounter for other screening for malignant neoplasm of breast: Secondary | ICD-10-CM | POA: Diagnosis not present

## 2014-05-01 DIAGNOSIS — Z23 Encounter for immunization: Secondary | ICD-10-CM | POA: Diagnosis not present

## 2014-05-01 DIAGNOSIS — IMO0001 Reserved for inherently not codable concepts without codable children: Secondary | ICD-10-CM | POA: Diagnosis not present

## 2014-05-13 DIAGNOSIS — M79609 Pain in unspecified limb: Secondary | ICD-10-CM | POA: Diagnosis not present

## 2014-05-13 DIAGNOSIS — M109 Gout, unspecified: Secondary | ICD-10-CM | POA: Diagnosis not present

## 2014-05-16 ENCOUNTER — Ambulatory Visit: Payer: Self-pay

## 2014-05-16 DIAGNOSIS — Z1231 Encounter for screening mammogram for malignant neoplasm of breast: Secondary | ICD-10-CM | POA: Diagnosis not present

## 2014-05-28 DIAGNOSIS — M109 Gout, unspecified: Secondary | ICD-10-CM | POA: Diagnosis not present

## 2014-07-09 DIAGNOSIS — Z23 Encounter for immunization: Secondary | ICD-10-CM | POA: Diagnosis not present

## 2014-07-25 ENCOUNTER — Ambulatory Visit: Payer: Self-pay | Admitting: Urology

## 2014-07-25 DIAGNOSIS — N281 Cyst of kidney, acquired: Secondary | ICD-10-CM | POA: Diagnosis not present

## 2014-07-28 DIAGNOSIS — N281 Cyst of kidney, acquired: Secondary | ICD-10-CM | POA: Diagnosis not present

## 2014-07-28 DIAGNOSIS — E663 Overweight: Secondary | ICD-10-CM | POA: Diagnosis not present

## 2014-07-28 DIAGNOSIS — N952 Postmenopausal atrophic vaginitis: Secondary | ICD-10-CM | POA: Diagnosis not present

## 2014-07-28 DIAGNOSIS — R351 Nocturia: Secondary | ICD-10-CM | POA: Diagnosis not present

## 2014-08-19 DIAGNOSIS — R351 Nocturia: Secondary | ICD-10-CM | POA: Diagnosis not present

## 2014-08-19 DIAGNOSIS — N952 Postmenopausal atrophic vaginitis: Secondary | ICD-10-CM | POA: Diagnosis not present

## 2014-08-19 DIAGNOSIS — Q6100 Congenital renal cyst, unspecified: Secondary | ICD-10-CM | POA: Diagnosis not present

## 2014-08-19 DIAGNOSIS — R32 Unspecified urinary incontinence: Secondary | ICD-10-CM | POA: Diagnosis not present

## 2015-06-24 DIAGNOSIS — K219 Gastro-esophageal reflux disease without esophagitis: Secondary | ICD-10-CM | POA: Insufficient documentation

## 2015-08-04 DIAGNOSIS — H35373 Puckering of macula, bilateral: Secondary | ICD-10-CM | POA: Diagnosis not present

## 2015-08-04 DIAGNOSIS — H26492 Other secondary cataract, left eye: Secondary | ICD-10-CM | POA: Diagnosis not present

## 2015-08-07 DIAGNOSIS — J0100 Acute maxillary sinusitis, unspecified: Secondary | ICD-10-CM | POA: Diagnosis not present

## 2015-08-07 DIAGNOSIS — H6123 Impacted cerumen, bilateral: Secondary | ICD-10-CM | POA: Diagnosis not present

## 2015-08-07 DIAGNOSIS — G510 Bell's palsy: Secondary | ICD-10-CM | POA: Diagnosis not present

## 2015-08-25 DIAGNOSIS — H26491 Other secondary cataract, right eye: Secondary | ICD-10-CM | POA: Diagnosis not present

## 2015-08-26 ENCOUNTER — Emergency Department
Admission: EM | Admit: 2015-08-26 | Discharge: 2015-08-26 | Disposition: A | Discharge status: Home / Self Care | Payer: Medicare Other | Attending: Emergency Medicine | Admitting: Emergency Medicine

## 2015-08-26 ENCOUNTER — Emergency Department: Payer: Medicare Other

## 2015-08-26 DIAGNOSIS — R109 Unspecified abdominal pain: Secondary | ICD-10-CM | POA: Diagnosis present

## 2015-08-26 DIAGNOSIS — Z79899 Other long term (current) drug therapy: Secondary | ICD-10-CM | POA: Insufficient documentation

## 2015-08-26 DIAGNOSIS — Z880 Allergy status to penicillin: Secondary | ICD-10-CM | POA: Insufficient documentation

## 2015-08-26 DIAGNOSIS — I10 Essential (primary) hypertension: Secondary | ICD-10-CM | POA: Diagnosis not present

## 2015-08-26 DIAGNOSIS — M549 Dorsalgia, unspecified: Secondary | ICD-10-CM | POA: Insufficient documentation

## 2015-08-26 DIAGNOSIS — Z791 Long term (current) use of non-steroidal anti-inflammatories (NSAID): Secondary | ICD-10-CM | POA: Insufficient documentation

## 2015-08-26 DIAGNOSIS — Z7951 Long term (current) use of inhaled steroids: Secondary | ICD-10-CM | POA: Diagnosis not present

## 2015-08-26 DIAGNOSIS — M25552 Pain in left hip: Secondary | ICD-10-CM | POA: Insufficient documentation

## 2015-08-26 HISTORY — DX: Essential (primary) hypertension: I10

## 2015-08-26 HISTORY — DX: Unspecified osteoarthritis, unspecified site: M19.90

## 2015-08-26 HISTORY — DX: Calculus of kidney: N20.0

## 2015-08-26 LAB — URINALYSIS COMPLETE WITH MICROSCOPIC (ARMC ONLY)
BILIRUBIN URINE: NEGATIVE
GLUCOSE, UA: NEGATIVE mg/dL
HGB URINE DIPSTICK: NEGATIVE
KETONES UR: NEGATIVE mg/dL
LEUKOCYTES UA: NEGATIVE
Nitrite: NEGATIVE
PH: 6 (ref 5.0–8.0)
Protein, ur: NEGATIVE mg/dL
SPECIFIC GRAVITY, URINE: 1.002 — AB (ref 1.005–1.030)
Squamous Epithelial / LPF: NONE SEEN

## 2015-08-26 LAB — CBC
HCT: 38 % (ref 35.0–47.0)
HEMOGLOBIN: 13.1 g/dL (ref 12.0–16.0)
MCH: 30.5 pg (ref 26.0–34.0)
MCHC: 34.5 g/dL (ref 32.0–36.0)
MCV: 88.3 fL (ref 80.0–100.0)
PLATELETS: 249 10*3/uL (ref 150–440)
RBC: 4.31 MIL/uL (ref 3.80–5.20)
RDW: 12.5 % (ref 11.5–14.5)
WBC: 7.9 10*3/uL (ref 3.6–11.0)

## 2015-08-26 LAB — COMPREHENSIVE METABOLIC PANEL
ALBUMIN: 4.6 g/dL (ref 3.5–5.0)
ALK PHOS: 40 U/L (ref 38–126)
ALT: 14 U/L (ref 14–54)
ANION GAP: 9 (ref 5–15)
AST: 21 U/L (ref 15–41)
BUN: 21 mg/dL — ABNORMAL HIGH (ref 6–20)
CALCIUM: 10.2 mg/dL (ref 8.9–10.3)
CHLORIDE: 106 mmol/L (ref 101–111)
CO2: 25 mmol/L (ref 22–32)
Creatinine, Ser: 1.19 mg/dL — ABNORMAL HIGH (ref 0.44–1.00)
GFR calc Af Amer: 51 mL/min — ABNORMAL LOW (ref >60)
GFR calc non Af Amer: 44 mL/min — ABNORMAL LOW (ref >60)
GLUCOSE: 114 mg/dL — AB (ref 65–99)
POTASSIUM: 3.4 mmol/L — AB (ref 3.5–5.1)
SODIUM: 140 mmol/L (ref 135–145)
Total Bilirubin: 0.5 mg/dL (ref 0.3–1.2)
Total Protein: 8.4 g/dL — ABNORMAL HIGH (ref 6.5–8.1)

## 2015-08-26 MED ORDER — DIAZEPAM 5 MG PO TABS: 5.0000 mg | ORAL_TABLET | Freq: Every evening | ORAL | Status: DC | PRN

## 2015-08-26 MED ORDER — DIAZEPAM 5 MG/ML IJ SOLN
5.0000 mg | Freq: Once | INTRAMUSCULAR | Status: AC
  Administered 2015-08-26: 5 mg via INTRAVENOUS
  Filled 2015-08-26: qty 2

## 2015-08-26 MED ORDER — ONDANSETRON HCL 4 MG/2ML IJ SOLN
4.0000 mg | Freq: Once | INTRAMUSCULAR | Status: AC
  Administered 2015-08-26: 4 mg via INTRAVENOUS
  Filled 2015-08-26: qty 2

## 2015-08-26 MED ORDER — MORPHINE SULFATE (PF) 2 MG/ML IV SOLN
2.0000 mg | Freq: Once | INTRAVENOUS | Status: AC
  Administered 2015-08-26: 2 mg via INTRAVENOUS
  Filled 2015-08-26: qty 1

## 2015-08-26 MED ORDER — KETOROLAC TROMETHAMINE 30 MG/ML IJ SOLN
30.0000 mg | Freq: Once | INTRAMUSCULAR | Status: AC
  Administered 2015-08-26: 30 mg via INTRAVENOUS
  Filled 2015-08-26: qty 1

## 2015-08-26 NOTE — ED Notes (Signed)
Pt transported to CT via stretcher.  

## 2015-08-26 NOTE — ED Notes (Signed)
Pt presents to ED via [facility] with L hip pain that is worse with movement. Pt states pain is from hip to ribs on L side. Pt took advil last night and currently has a fentanyl patch on L side.

## 2015-08-26 NOTE — Discharge Instructions (Signed)
You were prescribed a medication that is potentially sedating. Do not drink alcohol, drive or participate in any other potentially dangerous activities while taking this medication as it may make you sleepy. Do not take this medication with any other sedating medications, either prescription or over-the-counter. If you were prescribed Percocet or Vicodin, do not take these with acetaminophen (Tylenol) as it is already contained within these medications.   Opioid pain medications (or "narcotics") can be habit forming.  Use it as little as possible to achieve adequate pain control.  Do not use or use it with extreme caution if you have a history of opiate abuse or dependence.  If you are on a pain contract with your primary care doctor or a pain specialist, be sure to let them know you were prescribed this medication today from the [facility] Emergency Department.  This medication is intended for your use only - do not give any to anyone else and keep it in a secure place where nobody else, especially children and pets, have access to it.  It will also cause or worsen constipation, so you may want to consider taking an over-the-counter stool softener while you are taking this medication.  Flank Pain Flank pain refers to pain that is located on the side of the body between the upper abdomen and the back. The pain may occur over a short period of time (acute) or may be long-term or reoccurring (chronic). It may be mild or severe. Flank pain can be caused by many things. CAUSES  Some of the more common causes of flank pain include:  Muscle strains.   Muscle spasms.   A disease of your spine (vertebral disk disease).   A lung infection (pneumonia).   Fluid around your lungs (pulmonary edema).   A kidney infection.   Kidney stones.   A very painful skin rash caused by the chickenpox virus (shingles).   Gallbladder disease.  [place] care will depend on the  cause of your pain. In general,  Rest as directed by your caregiver.  Drink enough fluids to keep your urine clear or pale yellow.  Only take over-the-counter or prescription medicines as directed by your caregiver. Some medicines may help relieve the pain.  Tell your caregiver about any changes in your pain.  Follow up with your caregiver as directed. SEEK IMMEDIATE MEDICAL CARE IF:   Your pain is not controlled with medicine.   You have new or worsening symptoms.  Your pain increases.   You have abdominal pain.   You have shortness of breath.   You have persistent nausea or vomiting.   You have swelling in your abdomen.   You feel faint or pass out.   You have blood in your urine.  You have a fever or persistent symptoms for more than 2-3 days.  You have a fever and your symptoms suddenly get worse. MAKE SURE YOU:   Understand these instructions.  Will watch your condition.  Will get help right away if you are not doing well or get worse.   This information is not intended to replace advice given to you by your health care provider. Make sure you discuss any questions you have with your health care provider.   Document Released: 10/27/2005 Document Revised: 05/30/2012 Document Reviewed: 04/19/2012 Elsevier Interactive Patient Education [employer].

## 2015-08-26 NOTE — ED Provider Notes (Addendum)
Vss.  Ct without acute findings.  L lower back strain vs bursitis flare related to recent prolonged supine positioning during unrelated procedure.  Nad. Low suspicion AAA, dissection, torsion, perf, sbo, or pyelo.   Patient indicates the pain is mainly at the left SI joint which is slightly tender to palpation. No swelling induration or other inflammatory findings. Feels like she has a tense, spasming pain in that area. Counseled extensively on early mobility and gentle activity, heat therapy, gentle stretching, follow up with primary care. We'll give Toradol and Valium now for symptom relief prior to discharge  Carrie Mew, MD 08/26/15 [place], MD 08/26/15 [phone]

## 2015-08-26 NOTE — ED Provider Notes (Signed)
[facility] Emergency Department Provider Note  ____________________________________________  Time seen: 5:50 AM  I have reviewed the triage vital signs and the nursing notes.   HISTORY  Chief Complaint Hip Pain      HPI Anne Steele is a 74 y.o. female presents via EMS with left hip pain that is worse with movement that radiates to the left flank and abdomen. Patient states that pain started yesterday and progressively worsened over the course of the day. She states that she's been informed the past that this was bursitis in her left hip. She denies any dysuria no nausea or vomiting no hematuria.    Past Medical History  Diagnosis Date  . Hypertension   . Arthritis   . Kidney stones     There are no active problems to display for this patient.   Past Surgical History  Procedure Laterality Date  . Cholecystectomy    . Abdominal hysterectomy    . Carpal tunnel release      Current Outpatient Rx  Name  Route  Sig  Dispense  Refill  . ascorbic acid (VITAMIN C) 100 MG tablet   Oral   Take 500 mg by mouth daily.         . busPIRone (BUSPAR) 10 MG tablet   Oral   Take 10 mg by mouth 2 (two) times daily.         . cetirizine (ZYRTEC) 10 MG tablet   Oral   Take 10 mg by mouth daily.         . cholecalciferol (VITAMIN D) 400 UNITS TABS tablet   Oral   Take 1,000 Units by mouth.         . cyanocobalamin 1000 MCG tablet   Oral   Take 100 mcg by mouth daily.         . diclofenac (FLECTOR) 1.3 % PTCH   Transdermal   Place 1 patch onto the skin 2 (two) times daily.         . enalapril (VASOTEC) 5 MG tablet   Oral   Take 5 mg by mouth daily.         . fluticasone (FLONASE) 50 MCG/ACT nasal spray   Each Nare   Place 2 sprays into both nostrils daily.         . hydrochlorothiazide (HYDRODIURIL) 25 MG tablet   Oral   Take 25 mg by mouth daily.         . magnesium oxide (MAG-OX) 400 MG tablet   Oral   Take 400 mg  by mouth daily.         . metoprolol succinate (TOPROL-XL) 25 MG 24 hr tablet   Oral   Take 25 mg by mouth daily.         Marland Kitchen omega-3 acid ethyl esters (LOVAZA) 1 G capsule   Oral   Take 1 g by mouth 2 (two) times daily.         Marland Kitchen omeprazole (PRILOSEC) 20 MG capsule   Oral   Take 20 mg by mouth 2 (two) times daily before a meal.         . vitamin E 400 UNIT capsule   Oral   Take 400 Units by mouth daily.         . white petrolatum ointment   Topical   Apply topically Nightly.         Marland Kitchen alendronate-cholecalciferol (FOSAMAX PLUS D) 70-2800 MG-UNIT tablet   Oral  Take 1 tablet by mouth every 7 (seven) days. Take with a full glass of water on an empty stomach.         . cholecalciferol (D-VI-SOL) 400 UNIT/ML LIQD   Oral   Take 400 Units by mouth daily.           Allergies Penicillins; Sulfa antibiotics; Tetracyclines & related; Zocor; and Zovirax  No family history on file.  Social History Social History  Substance Use Topics  . Smoking status: Never Smoker   . Smokeless tobacco: None  . Alcohol Use: No    Review of Systems  Constitutional: Negative for fever. Eyes: Negative for visual changes. ENT: Negative for sore throat. Cardiovascular: Negative for chest pain. Respiratory: Negative for shortness of breath. Gastrointestinal: Negative for abdominal pain, vomiting and diarrhea. Positive for left flank pain Genitourinary: Negative for dysuria. Musculoskeletal: Positive for back pain. Left hip tenderness Skin: Negative for rash. Neurological: Negative for headaches, focal weakness or numbness.   10-point ROS otherwise negative.  ____________________________________________   PHYSICAL EXAM:  VITAL SIGNS: ED Triage Vitals  Enc Vitals Group     BP 08/26/15 0540 185/93 mmHg     Pulse Rate 08/26/15 0540 88     Resp 08/26/15 0540 20     Temp 08/26/15 0540 97.7 F (36.5 C)     Temp Source 08/26/15 0540 Oral     SpO2 08/26/15 0540 97 %      Weight --      Height --      Head Cir --      Peak Flow --      Pain Score 08/26/15 0541 10     Pain Loc --      Pain Edu? --      Excl. in [place]? --      Constitutional: Alert and oriented. Apparent discomfort Eyes: Conjunctivae are normal. PERRL. Normal extraocular movements. ENT   Head: Normocephalic and atraumatic.   Nose: No congestion/rhinnorhea.   Mouth/Throat: Mucous membranes are moist.   Neck: No stridor. Hematological/Lymphatic/Immunilogical: No cervical lymphadenopathy. Cardiovascular: Normal rate, regular rhythm. Normal and symmetric distal pulses are present in all extremities. No murmurs, rubs, or gallops. Respiratory: Normal respiratory effort without tachypnea nor retractions. Breath sounds are clear and equal bilaterally. No wheezes/rales/rhonchi. Gastrointestinal: Soft and nontender. No distention. There is no CVA tenderness. Genitourinary: deferred Musculoskeletal: Left hip tenderness to palpation  Neurologic:  Normal speech and language. No gross focal neurologic deficits are appreciated. Speech is normal.  Skin:  Skin is warm, dry and intact. No rash noted. Psychiatric: Mood and affect are normal. Speech and behavior are normal. Patient exhibits appropriate insight and judgment.  ____________________________________________    LABS (pertinent positives/negatives)  Labs Reviewed  COMPREHENSIVE METABOLIC PANEL - Abnormal; Notable for the following:    Potassium 3.4 (*)    Glucose, Bld 114 (*)    BUN 21 (*)    Creatinine, Ser 1.19 (*)    Total Protein 8.4 (*)    GFR calc non Af Amer 44 (*)    GFR calc Af Amer 51 (*)    All other components within normal limits  URINALYSIS COMPLETEWITH MICROSCOPIC ([facility] ONLY) - Abnormal; Notable for the following:    Color, Urine COLORLESS (*)    APPearance CLEAR (*)    Specific Gravity, Urine 1.002 (*)    Bacteria, UA RARE (*)    All other components within normal limits  CBC          RADIOLOGY  INITIAL IMPRESSION / ASSESSMENT AND PLAN / ED COURSE  Pertinent labs & imaging results that were available during my care of the patient were reviewed by me and considered in my medical decision making (see chart for details).  We'll obtain a CT scan of the patient's abdomen and pelvis with possible ureterolithiasis. Other potential etiology includes bursitis or muscle spasm. Patient's care is transferred to Dr. Joni Fears  ____________________________________________   FINAL CLINICAL IMPRESSION(S) / ED DIAGNOSES  Final diagnoses:  Left flank pain      Gregor Hams, MD 08/28/15 [phone]

## 2015-08-26 NOTE — ED Notes (Signed)
Family at bedside. 

## 2015-09-16 DIAGNOSIS — R51 Headache: Secondary | ICD-10-CM | POA: Diagnosis not present

## 2015-09-16 DIAGNOSIS — G510 Bell's palsy: Secondary | ICD-10-CM | POA: Diagnosis not present

## 2015-09-20 HISTORY — PX: CATARACT EXTRACTION: SUR2

## 2015-09-22 DIAGNOSIS — H04123 Dry eye syndrome of bilateral lacrimal glands: Secondary | ICD-10-CM | POA: Diagnosis not present

## 2015-09-22 DIAGNOSIS — H35373 Puckering of macula, bilateral: Secondary | ICD-10-CM | POA: Diagnosis not present

## 2015-09-22 DIAGNOSIS — H40023 Open angle with borderline findings, high risk, bilateral: Secondary | ICD-10-CM | POA: Diagnosis not present

## 2015-09-22 DIAGNOSIS — Z961 Presence of intraocular lens: Secondary | ICD-10-CM | POA: Diagnosis not present

## 2016-01-27 ENCOUNTER — Other Ambulatory Visit: Payer: Self-pay | Admitting: Infectious Diseases

## 2016-01-27 ENCOUNTER — Ambulatory Visit: Payer: Medicare Other

## 2016-01-27 DIAGNOSIS — Z1231 Encounter for screening mammogram for malignant neoplasm of breast: Secondary | ICD-10-CM

## 2016-01-31 DIAGNOSIS — H47093 Other disorders of optic nerve, not elsewhere classified, bilateral: Secondary | ICD-10-CM | POA: Insufficient documentation

## 2016-01-31 DIAGNOSIS — R51 Headache: Secondary | ICD-10-CM

## 2016-01-31 DIAGNOSIS — R519 Headache, unspecified: Secondary | ICD-10-CM | POA: Insufficient documentation

## 2016-02-17 ENCOUNTER — Ambulatory Visit
Admission: RE | Admit: 2016-02-17 | Discharge: 2016-02-17 | Disposition: A | Discharge status: Home / Self Care | Payer: Medicare Other | Source: Ambulatory Visit | Attending: Infectious Diseases | Admitting: Infectious Diseases

## 2016-02-17 DIAGNOSIS — Z1231 Encounter for screening mammogram for malignant neoplasm of breast: Secondary | ICD-10-CM | POA: Diagnosis not present

## 2016-02-19 ENCOUNTER — Other Ambulatory Visit: Payer: Self-pay | Admitting: Infectious Diseases

## 2016-02-19 DIAGNOSIS — R928 Other abnormal and inconclusive findings on diagnostic imaging of breast: Secondary | ICD-10-CM

## 2016-02-26 ENCOUNTER — Ambulatory Visit
Admission: RE | Admit: 2016-02-26 | Discharge: 2016-02-26 | Disposition: A | Discharge status: Home / Self Care | Payer: Medicare Other | Source: Ambulatory Visit | Attending: Infectious Diseases | Admitting: Infectious Diseases

## 2016-02-26 DIAGNOSIS — R928 Other abnormal and inconclusive findings on diagnostic imaging of breast: Secondary | ICD-10-CM

## 2016-02-26 DIAGNOSIS — N63 Unspecified lump in breast: Secondary | ICD-10-CM | POA: Insufficient documentation

## 2016-02-26 DIAGNOSIS — R921 Mammographic calcification found on diagnostic imaging of breast: Secondary | ICD-10-CM | POA: Diagnosis not present

## 2016-03-01 ENCOUNTER — Other Ambulatory Visit: Payer: Self-pay | Admitting: Infectious Diseases

## 2016-03-01 DIAGNOSIS — N6310 Unspecified lump in the right breast, unspecified quadrant: Secondary | ICD-10-CM

## 2016-03-01 DIAGNOSIS — R921 Mammographic calcification found on diagnostic imaging of breast: Secondary | ICD-10-CM

## 2016-03-04 ENCOUNTER — Other Ambulatory Visit: Payer: Self-pay | Admitting: Diagnostic Radiology

## 2016-03-04 ENCOUNTER — Ambulatory Visit
Admission: RE | Admit: 2016-03-04 | Discharge: 2016-03-04 | Disposition: A | Discharge status: Home / Self Care | Payer: Medicare Other | Source: Ambulatory Visit | Attending: Infectious Diseases | Admitting: Infectious Diseases

## 2016-03-04 ENCOUNTER — Ambulatory Visit: Payer: Medicare (Managed Care)

## 2016-03-04 DIAGNOSIS — D242 Benign neoplasm of left breast: Secondary | ICD-10-CM | POA: Diagnosis not present

## 2016-03-04 DIAGNOSIS — R921 Mammographic calcification found on diagnostic imaging of breast: Secondary | ICD-10-CM | POA: Insufficient documentation

## 2016-03-04 DIAGNOSIS — D241 Benign neoplasm of right breast: Secondary | ICD-10-CM | POA: Insufficient documentation

## 2016-03-04 DIAGNOSIS — N63 Unspecified lump in breast: Secondary | ICD-10-CM | POA: Diagnosis present

## 2016-03-04 DIAGNOSIS — N6310 Unspecified lump in the right breast, unspecified quadrant: Secondary | ICD-10-CM

## 2016-03-04 HISTORY — PX: BREAST BIOPSY: SHX20

## 2016-03-07 LAB — SURGICAL PATHOLOGY

## 2016-03-08 ENCOUNTER — Encounter: Payer: Self-pay | Admitting: *Deleted

## 2016-03-08 NOTE — Progress Notes (Signed)
  Oncology Nurse Navigator Documentation  Navigator Location: [facility] (03/08/16 1400) Navigator Encounter Type: Introductory phone call (03/08/16 1400)   Abnormal Finding Date: 02/23/16 (03/08/16 1400)       Patient Visit Type: Initial (03/08/16 1400)                    Acuity: Level 1 (03/08/16 1400)         Time Spent with Patient: 60 (03/08/16 1400)   Patient has an intraductal papilloma, and needs a surgical consult for excision.  Scheduled appointment with Dr. Bary Castilla for consult on Monday 03/14/16 at 11:15.  Notified Dr. Ola Spurr at [facility] of appointment and patient's choice of surgeon.

## 2016-03-14 ENCOUNTER — Ambulatory Visit (INDEPENDENT_AMBULATORY_CARE_PROVIDER_SITE_OTHER): Payer: Medicare Other | Admitting: General Surgery

## 2016-03-14 ENCOUNTER — Encounter: Payer: Self-pay | Admitting: General Surgery

## 2016-03-14 ENCOUNTER — Ambulatory Visit: Payer: Self-pay

## 2016-03-14 VITALS — BP 132/78 | HR 79 | Resp 14 | Ht 59.0 in | Wt 168.0 lb

## 2016-03-14 DIAGNOSIS — D241 Benign neoplasm of right breast: Secondary | ICD-10-CM

## 2016-03-14 DIAGNOSIS — N63 Unspecified lump in breast: Secondary | ICD-10-CM

## 2016-03-14 DIAGNOSIS — N6320 Unspecified lump in the left breast, unspecified quadrant: Secondary | ICD-10-CM

## 2016-03-14 NOTE — Progress Notes (Signed)
Patient ID: Anne Steele, female   DOB: 07-Dec-1940, 75 y.o.   MRN: [id number]  Chief Complaint  Patient presents with  . Breast Problem    INTRADUCTAL PAPILLOMA    HPI Anne Steele is a 75 y.o. female.  who presents for a breast evaluation. The most recent mammogram and biopsy was done on 03-04-16. Right breast showed an intraductal papilloma and the left breast was calcifications. Patient does perform regular self breast checks and gets regular mammograms done. She reports that she had felt some tenderness and possible mass in the left breast prior to her current mammogram. She reported she first noticed this about a year ago. She states the tenderness has gotten worse. She reports the area got much more tender after her recent left stereotactic biopsy and mammogram. She reports no problems with the right breast. The area that the patient palpated has not changed in size since initial discovery. She reported to the [facility] staff and was told that she would have to return for an additional imaging study. This was after she had artery undergone 2 biopsies.  She is here today with her daughter Anne Steele.   I personally reviewed the patient's history.   HPI  Past Medical History  Diagnosis Date  . Hypertension   . Arthritis   . Kidney stones   . Fibromyalgia   . GERD (gastroesophageal reflux disease)   . Hypercalcemia   . Head ache   . Optic nerve disease   . Bell palsy 1973  . Hiatal hernia   . Asthma     Past Surgical History  Procedure Laterality Date  . Carpal tunnel release Bilateral 2012  . Abdominal hysterectomy  1982  . Cholecystectomy  2001  . Cataract extraction Bilateral 2017  . Knee surgery Left   . Breast biopsy Right 03/04/2016    US biopsy/ INTRADUCTAL PAPILLOMA  . Breast biopsy Left 03/04/2016    Stereo biopsy/ Calcifications    Family History  Problem Relation Age of Onset  . Breast cancer Sister   . Diabetes Father   . Leukemia Mother   . Brain  cancer Brother   . Diabetes Sister   . Diabetes Brother     Social History Social History  Substance Use Topics  . Smoking status: Never Smoker   . Smokeless tobacco: Never Used  . Alcohol Use: No    Allergies  Allergen Reactions  . Iodine Itching  . Penicillins   . Sulfa Antibiotics   . Tape   . Tetracyclines & Related   . Zocor [Simvastatin]   . Zovirax [Acyclovir]     Current Outpatient Prescriptions  Medication Sig Dispense Refill  . alendronate-cholecalciferol (FOSAMAX PLUS D) 70-2800 MG-UNIT tablet Take 1 tablet by mouth every 7 (seven) days. Take with a full glass of water on an empty stomach.    . busPIRone (BUSPAR) 10 MG tablet Take 10 mg by mouth 2 (two) times daily.    . cetirizine (ZYRTEC) 10 MG tablet Take 10 mg by mouth daily.    . cholecalciferol (D-VI-SOL) 400 UNIT/ML LIQD Take 400 Units by mouth daily.    . cyanocobalamin 1000 MCG tablet Take 100 mcg by mouth daily.    . diazepam (VALIUM) 5 MG tablet Take 1 tablet (5 mg total) by mouth at bedtime as needed for muscle spasms. 3 tablet 0  . diclofenac (FLECTOR) 1.3 % PTCH Place 1 patch onto the skin 2 (two) times daily.    . enalapril (VASOTEC)  5 MG tablet Take 5 mg by mouth daily.    . fluticasone (FLONASE) 50 MCG/ACT nasal spray Place 2 sprays into both nostrils daily.    . hydrochlorothiazide (HYDRODIURIL) 25 MG tablet Take 25 mg by mouth daily.    . hyoscyamine (LEVSIN, ANASPAZ) 0.125 MG tablet Take by mouth.    . latanoprost (XALATAN) 0.005 % ophthalmic solution Place 1 drop into both eyes at bedtime.    . magnesium oxide (MAG-OX) 400 MG tablet Take 400 mg by mouth daily.    . metoprolol succinate (TOPROL-XL) 25 MG 24 hr tablet Take 25 mg by mouth daily.    Marland Kitchen omeprazole (PRILOSEC) 20 MG capsule Take 20 mg by mouth 2 (two) times daily before a meal.     No current facility-administered medications for this visit.    Review of Systems Review of Systems  Constitutional: Negative.   Respiratory:  Negative.   Cardiovascular: Negative.     Blood pressure 132/78, pulse 79, resp. rate 14, height 4\' 11"  (1.499 m), weight 168 lb (76.204 kg).  Physical Exam Physical Exam  Constitutional: She is oriented to person, place, and time. She appears well-developed and well-nourished.  Eyes: Conjunctivae are normal. No scleral icterus.  Neck: Neck supple.  Cardiovascular: Normal rate and regular rhythm.   Murmur heard.  Systolic murmur is present with a grade of 1/6  Pulmonary/Chest: Effort normal and breath sounds normal. Right breast exhibits tenderness (pectoralis muscle). Right breast exhibits no inverted nipple, no mass, no nipple discharge and no skin change. Left breast exhibits mass (1 cm mass) and tenderness (pectroalis muscle). Left breast exhibits no inverted nipple, no nipple discharge and no skin change.    Lymphadenopathy:    She has no cervical adenopathy.  Neurological: She is alert and oriented to person, place, and time.  Skin: Skin is warm and dry.  Psychiatric: She has a normal mood and affect.    Data Reviewed The patient saw go with the mammography department began on 02/17/2016 when she underwent screening mammograms which were BI-RADS-0.  She returned for additional imaging of both breasts including an ultrasound on the right on 02/26/2016. The right breast suggested a 6 mm oval mass lateral to the nipple and in the left breast a 7 mm group of amorphous calcifications identified in the upper inner quadrant. New BI-RADS 4.  The patient subsequently underwent ultrasound-guided biopsy of the right breast on 03/04/2016 with a 12-gauge spring-loaded device used. This was at the 9:00 position 1 cm from the nipple. Postbiopsy mammogram was completed. Her tachycardia biopsy of the left breast was completed 03/04/2016.  DIAGNOSIS:  A. BREAST LESION, RIGHT 9:00; ULTRASOUND GUIDED BIOPSY:  - INTRADUCTAL PAPILLOMA.  - NEGATIVE FOR ATYPIA AND MALIGNANCY.   B. BREAST  CALCIFICATIONS, LEFT UPPER OUTER QUADRANT; STEREOTACTIC CORE  BIOPSY:  - FIBROADENOMA WITH CALCIFICATIONS.  - NEGATIVE FOR ATYPIA AND MALIGNANCY.   Ultrasound examination of the upper outer quadrant of the left breast of the area of patient concern in the 2:00 position, 77 m from the nipple showed a poorly defined heterogeneous area with sharp borders and moderate posterior acoustic enhancement measuring 0.3 x 0.5 x 0.6 cm. Did smoothly marginated borders and acoustic enhancement this likely represents a secondary fibroadenoma. BI-RADS-3. 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  Fibroadenoma left breast.  Small nodule in the upper-outer quadrant of the left breast with no reported change in size over the past year.  Plan    Observation alone is warned of this time. Without the identification of atypia on the papilloma observation is reasonable.  We'll plan for repeat assessment with ultrasound at the time of her upcoming follow-up in 6 months.     Continue self breast exams. Call office for any new breast issues or concerns.   PCP:  Adrian Prows This has been scribed by Lesly Rubenstein LPN   Robert Bellow 03/15/2016, 10:49 PM

## 2016-03-14 NOTE — Patient Instructions (Addendum)
The patient is aware to call back for any questions or concerns. Continue self breast exams. Call office for any new breast issues or concerns. 

## 2016-03-15 DIAGNOSIS — D249 Benign neoplasm of unspecified breast: Secondary | ICD-10-CM | POA: Insufficient documentation

## 2016-03-15 DIAGNOSIS — D242 Benign neoplasm of left breast: Secondary | ICD-10-CM | POA: Insufficient documentation

## 2016-04-06 DIAGNOSIS — R252 Cramp and spasm: Secondary | ICD-10-CM | POA: Insufficient documentation

## 2016-09-05 ENCOUNTER — Ambulatory Visit: Payer: Medicare Other | Admitting: General Surgery

## 2016-09-28 ENCOUNTER — Encounter: Payer: Self-pay | Admitting: General Surgery

## 2016-09-28 ENCOUNTER — Inpatient Hospital Stay: Payer: Self-pay

## 2016-09-28 ENCOUNTER — Ambulatory Visit (INDEPENDENT_AMBULATORY_CARE_PROVIDER_SITE_OTHER): Payer: Medicare Other | Admitting: General Surgery

## 2016-09-28 ENCOUNTER — Ambulatory Visit: Payer: Medicare Other | Admitting: General Surgery

## 2016-09-28 VITALS — BP 130/74 | HR 82 | Resp 14 | Ht 59.0 in | Wt 167.0 lb

## 2016-09-28 DIAGNOSIS — D241 Benign neoplasm of right breast: Secondary | ICD-10-CM | POA: Diagnosis not present

## 2016-09-28 DIAGNOSIS — D242 Benign neoplasm of left breast: Secondary | ICD-10-CM | POA: Diagnosis not present

## 2016-09-28 DIAGNOSIS — N6320 Unspecified lump in the left breast, unspecified quadrant: Secondary | ICD-10-CM

## 2016-09-28 NOTE — Patient Instructions (Signed)
The patient is aware to call back for any questions or concerns.  

## 2016-09-28 NOTE — Progress Notes (Signed)
Patient ID: Anne Steele, female   DOB: 1941-09-06, 76 y.o.   MRN: [id number]  Chief Complaint  Patient presents with  . Follow-up    right breast papilloma    HPI Anne Steele is a 76 y.o. female here today for her six months follow up right breast papilloma and left breast mass and ultrasound. She states she is having some left lateral breast and side pain near her rib area but thinks it maybe her fibromyalgia.  Marland KitchenHPI  Past Medical History:  Diagnosis Date  . Arthritis   . Asthma   . Bell palsy 1973  . Fibromyalgia   . GERD (gastroesophageal reflux disease)   . Head ache   . Hiatal hernia   . Hypercalcemia   . Hypertension   . Kidney stones   . Optic nerve disease     Past Surgical History:  Procedure Laterality Date  . ABDOMINAL HYSTERECTOMY  1982  . BREAST BIOPSY Right 03/04/2016   US biopsy/ INTRADUCTAL PAPILLOMA  . BREAST BIOPSY Left 03/04/2016   Stereo biopsy/ Calcifications  . CARPAL TUNNEL RELEASE Bilateral 2012  . CATARACT EXTRACTION Bilateral 2017  . CHOLECYSTECTOMY  2001  . KNEE SURGERY Left     Family History  Problem Relation Age of Onset  . Breast cancer Sister   . Diabetes Father   . Leukemia Mother   . Brain cancer Brother   . Diabetes Sister   . Diabetes Brother     Social History Social History  Substance Use Topics  . Smoking status: Never Smoker  . Smokeless tobacco: Never Used  . Alcohol use No    Allergies  Allergen Reactions  . Iodine Itching  . Penicillins   . Sulfa Antibiotics   . Tape   . Tetracyclines & Related   . Zocor [Simvastatin]   . Zovirax [Acyclovir]     Current Outpatient Prescriptions  Medication Sig Dispense Refill  . alendronate-cholecalciferol (FOSAMAX PLUS D) 70-2800 MG-UNIT tablet Take 1 tablet by mouth every 7 (seven) days. Take with a full glass of water on an empty stomach.    . busPIRone (BUSPAR) 10 MG tablet Take 10 mg by mouth 2 (two) times daily.    . cetirizine (ZYRTEC) 10 MG tablet Take 10 mg  by mouth daily.    . cholecalciferol (D-VI-SOL) 400 UNIT/ML LIQD Take 400 Units by mouth daily.    . cyanocobalamin 1000 MCG tablet Take 100 mcg by mouth daily.    . diazepam (VALIUM) 5 MG tablet Take 1 tablet (5 mg total) by mouth at bedtime as needed for muscle spasms. 3 tablet 0  . diclofenac (FLECTOR) 1.3 % PTCH Place 1 patch onto the skin 2 (two) times daily.    . enalapril (VASOTEC) 5 MG tablet Take 5 mg by mouth daily.    . fluticasone (FLONASE) 50 MCG/ACT nasal spray Place 2 sprays into both nostrils daily.    . hydrochlorothiazide (HYDRODIURIL) 25 MG tablet Take 25 mg by mouth daily.    . hyoscyamine (LEVSIN, ANASPAZ) 0.125 MG tablet Take by mouth.    . latanoprost (XALATAN) 0.005 % ophthalmic solution Place 1 drop into both eyes at bedtime.    . magnesium oxide (MAG-OX) 400 MG tablet Take 400 mg by mouth daily.    . metoprolol succinate (TOPROL-XL) 25 MG 24 hr tablet Take 25 mg by mouth daily.    Marland Kitchen omeprazole (PRILOSEC) 20 MG capsule Take 20 mg by mouth 2 (two) times daily before a meal.  No current facility-administered medications for this visit.     Review of Systems Review of Systems  Constitutional: Negative.   Respiratory: Negative.   Cardiovascular: Negative.     Blood pressure 130/74, pulse 82, resp. rate 14, height 4\' 11"  (1.499 m), weight 167 lb (75.8 kg).  Physical Exam Physical Exam  Constitutional: She is oriented to person, place, and time. She appears well-developed and well-nourished.  HENT:  Mouth/Throat: Oropharynx is clear and moist.  Eyes: Conjunctivae are normal. No scleral icterus.  Neck: Neck supple.  Cardiovascular: Normal rate, regular rhythm and normal heart sounds.   Pulmonary/Chest: Effort normal and breath sounds normal. Right breast exhibits no inverted nipple, no mass, no nipple discharge, no skin change and no tenderness. Left breast exhibits no inverted nipple, no mass, no nipple discharge, no skin change and no tenderness.   Lymphadenopathy:    She has no cervical adenopathy.    She has no axillary adenopathy.  Neurological: She is alert and oriented to person, place, and time.  Skin: Skin is warm and dry.  Psychiatric: Her behavior is normal.    Data Reviewed 03/04/2016 pathology:   A. BREAST LESION, RIGHT 9:00; ULTRASOUND GUIDED BIOPSY:  - INTRADUCTAL PAPILLOMA.  - NEGATIVE FOR ATYPIA AND MALIGNANCY.   B. BREAST CALCIFICATIONS, LEFT UPPER OUTER QUADRANT; STEREOTACTIC CORE  BIOPSY:  - FIBROADENOMA WITH CALCIFICATIONS.  - NEGATIVE FOR ATYPIA AND MALIGNANCY.   Ultrasound examination of the right and left breast was completed. In the right breast at the 9:00 position just 1 cm from the nipple a small residual area of papilloma remains measuring 0.3 x 0.31 x 0.36 cm. No interval change.  Ultrasound examination of the left breast shows residual fibroadenoma measuring 0.0 0.27 x 0.3 cm. BI-RADS-2 based on previous pathology.  Assessment    No interval change in the previously biopsied right breast papilloma without atypia. Stable left breast fibroadenoma.     Plan    The patient should resume annual screening mammograms with her PCP.    Follow up as needed . Annual mammogram with PCP.  This information has been scribed by Karie Fetch RN, BSN,BC. Marland Kitchen   Robert Bellow 09/30/2016, 7:44 PM

## 2016-10-11 ENCOUNTER — Encounter: Payer: Self-pay | Admitting: Urology

## 2016-10-11 ENCOUNTER — Ambulatory Visit (INDEPENDENT_AMBULATORY_CARE_PROVIDER_SITE_OTHER): Payer: Medicare (Managed Care) | Admitting: Urology

## 2016-10-11 VITALS — BP 151/84 | HR 112 | Temp 97.9°F | Ht 59.0 in | Wt 162.5 lb

## 2016-10-11 DIAGNOSIS — R109 Unspecified abdominal pain: Secondary | ICD-10-CM

## 2016-10-11 DIAGNOSIS — F32A Depression, unspecified: Secondary | ICD-10-CM | POA: Insufficient documentation

## 2016-10-11 DIAGNOSIS — R300 Dysuria: Secondary | ICD-10-CM | POA: Diagnosis not present

## 2016-10-11 DIAGNOSIS — N200 Calculus of kidney: Secondary | ICD-10-CM | POA: Diagnosis not present

## 2016-10-11 DIAGNOSIS — F329 Major depressive disorder, single episode, unspecified: Secondary | ICD-10-CM | POA: Insufficient documentation

## 2016-10-11 DIAGNOSIS — M797 Fibromyalgia: Secondary | ICD-10-CM | POA: Insufficient documentation

## 2016-10-11 DIAGNOSIS — E7800 Pure hypercholesterolemia, unspecified: Secondary | ICD-10-CM | POA: Insufficient documentation

## 2016-10-11 DIAGNOSIS — N189 Chronic kidney disease, unspecified: Secondary | ICD-10-CM | POA: Insufficient documentation

## 2016-10-11 DIAGNOSIS — Z87442 Personal history of urinary calculi: Secondary | ICD-10-CM | POA: Insufficient documentation

## 2016-10-11 LAB — URINALYSIS, COMPLETE
BILIRUBIN UA: NEGATIVE
GLUCOSE, UA: NEGATIVE
KETONES UA: NEGATIVE
NITRITE UA: POSITIVE — AB
PROTEIN UA: NEGATIVE
Specific Gravity, UA: <1.005 — ABNORMAL LOW (ref 1.005–1.030)
UUROB: 0.2 mg/dL (ref 0.2–1.0)
pH, UA: 6 (ref 5.0–7.5)

## 2016-10-11 LAB — MICROSCOPIC EXAMINATION: RBC, UA: NONE SEEN /hpf (ref 0–?)

## 2016-10-11 LAB — BLADDER SCAN AMB NON-IMAGING: SCAN RESULT: 50

## 2016-10-11 MED ORDER — CIPROFLOXACIN HCL 500 MG PO TABS: 500.0000 mg | ORAL_TABLET | Freq: Two times a day (BID) | ORAL | 0 refills | Status: DC

## 2016-10-11 NOTE — Progress Notes (Signed)
10/11/2016 2:37 PM   Anne Steele 1941-03-12 [id number]  Referring provider: Leonel Ramsay, MD [place], [place] [address]  Chief Complaint  Patient presents with  . Dysuria    HPI: Patient is a 76 -year-old Caucasian female who is referred to [place] by, Dr. Ola Spurr, for dysuria.  Patient states that she has had dysuria for over a week, but it worsened over the weekend.  She is having associated dysuria and nocturia x 2.  She does/does not have a history of urinary tract infections, STI's or injury to the bladder.  She is experiencing left flank pain that is radiating into her left side.  She denies, gross hematuria, suprapubic pain, back pain or abdominal pain.  She has not had any recent fevers, chills, nausea or vomiting.   She does have a history of nephrolithiasis.  Her stone composition is unknown at this time.    CT Renal stone study performed on 08/26/2015 noted a bilateral nonobstructive nephrolithiasis as above. No CT evidence for obstructive uropathy.  Scattered cysts within the bilateral kidneys, with 2 small hyperdense lesions within the left kidney as above. These are not significantly changed relative to prior CT from 08/01/2013, and likely reflect small proteinaceous or hemorrhagic cysts given their relative stability.  No other acute intra-abdominal or pelvic process identified.  Status post cholecystectomy.  I have independently reviewed the films.    Her UA today demonstrates > 30 WBC's and nitrite positive.  Her PVR today is 50 mL.    PMH: Past Medical History:  Diagnosis Date  . Arthritis   . Asthma   . Bell palsy 1973  . Fibromyalgia   . GERD (gastroesophageal reflux disease)   . Head ache   . Hiatal hernia   . Hypercalcemia   . Hypertension   . Kidney stones   . Optic nerve disease     Surgical History: Past Surgical History:  Procedure Laterality Date  . ABDOMINAL HYSTERECTOMY   1982  . BREAST BIOPSY Right 03/04/2016   US biopsy/ INTRADUCTAL PAPILLOMA  . BREAST BIOPSY Left 03/04/2016   Stereo biopsy/ Calcifications  . CARPAL TUNNEL RELEASE Bilateral 2012  . CATARACT EXTRACTION Bilateral 2017  . CHOLECYSTECTOMY  2001  . KNEE SURGERY Left     Home Medications:  Allergies as of 10/11/2016      Reactions   Iodine Itching   Penicillins    Sulfa Antibiotics    Tape    Tetracyclines & Related    Zocor [simvastatin]    Zovirax [acyclovir]       Medication List       Accurate as of 10/11/16  2:37 PM. Always use your most recent med list.          alendronate-cholecalciferol 70-2800 MG-UNIT tablet Commonly known as:  FOSAMAX PLUS D Take 1 tablet by mouth every 7 (seven) days. Take with a full glass of water on an empty stomach.   busPIRone 10 MG tablet Commonly known as:  BUSPAR Take 10 mg by mouth 2 (two) times daily.   cetirizine 10 MG tablet Commonly known as:  ZYRTEC Take 10 mg by mouth daily.   cholecalciferol 400 UNIT/ML Liqd Commonly known as:  D-VI-SOL Take 400 Units by mouth daily.   ciprofloxacin 500 MG tablet Commonly known as:  CIPRO Take 1 tablet (500 mg total) by mouth every 12 (twelve) hours.   cyanocobalamin 1000 MCG tablet Take 100 mcg by  mouth daily.   diazepam 5 MG tablet Commonly known as:  VALIUM Take 1 tablet (5 mg total) by mouth at bedtime as needed for muscle spasms.   diclofenac 1.3 % Ptch Commonly known as:  FLECTOR Place 1 patch onto the skin 2 (two) times daily.   enalapril 5 MG tablet Commonly known as:  VASOTEC Take 5 mg by mouth daily.   fluticasone 50 MCG/ACT nasal spray Commonly known as:  FLONASE Place 2 sprays into both nostrils daily.   hydrochlorothiazide 25 MG tablet Commonly known as:  HYDRODIURIL Take 25 mg by mouth daily.   hyoscyamine 0.125 MG tablet Commonly known as:  LEVSIN, ANASPAZ Take by mouth.   latanoprost 0.005 % ophthalmic solution Commonly known as:  XALATAN Place 1 drop  into both eyes at bedtime.   magnesium oxide 400 MG tablet Commonly known as:  MAG-OX Take 400 mg by mouth daily.   metoprolol succinate 25 MG 24 hr tablet Commonly known as:  TOPROL-XL Take 25 mg by mouth daily.   omeprazole 20 MG capsule Commonly known as:  PRILOSEC Take 20 mg by mouth 2 (two) times daily before a meal.   tiZANidine 2 MG tablet Commonly known as:  ZANAFLEX TAKE 1 TABLET BY MOUTH AT BEDTIME AS NEEDED.       Allergies:  Allergies  Allergen Reactions  . Iodine Itching  . Penicillins   . Sulfa Antibiotics   . Tape   . Tetracyclines & Related   . Zocor [Simvastatin]   . Zovirax [Acyclovir]     Family History: Family History  Problem Relation Age of Onset  . Breast cancer Sister   . Diabetes Father   . Leukemia Mother   . Brain cancer Brother   . Diabetes Sister   . Diabetes Brother     Social History:  reports that she has never smoked. She has never used smokeless tobacco. She reports that she does not drink alcohol or use drugs.  ROS: UROLOGY Frequent Urination?: No Hard to postpone urination?: No Burning/pain with urination?: Yes Get up at night to urinate?: No Leakage of urine?: Yes Urine stream starts and stops?: No Trouble starting stream?: No Do you have to strain to urinate?: No Blood in urine?: No Urinary tract infection?: Yes Sexually transmitted disease?: No Injury to kidneys or bladder?: No Painful intercourse?: No Weak stream?: No Currently pregnant?: No Vaginal bleeding?: No Last menstrual period?: n  Gastrointestinal Nausea?: Yes Vomiting?: No Indigestion/heartburn?: No Diarrhea?: No Constipation?: No  Constitutional Fever: No Night sweats?: No Weight loss?: No Fatigue?: No  Skin Skin rash/lesions?: No Itching?: No  Eyes Blurred vision?: No Double vision?: No  Ears/Nose/Throat Sore throat?: No Sinus problems?: No  Hematologic/Lymphatic Swollen glands?: No Easy bruising?: No  Cardiovascular Leg  swelling?: No Chest pain?: No  Respiratory Cough?: No Shortness of breath?: No  Endocrine Excessive thirst?: No  Musculoskeletal Back pain?: No Joint pain?: No  Neurological Headaches?: No Dizziness?: No  Psychologic Depression?: No Anxiety?: No  Physical Exam: BP (!) 151/84 (BP Location: Left Arm, Patient Position: Sitting, Cuff Size: Large)   Pulse (!) 112   Temp 97.9 F (36.6 C) (Oral)   Ht 4\' 11"  (1.499 m)   Wt 162 lb 8 oz (73.7 kg)   BMI 32.82 kg/m   Constitutional: Well nourished. Alert and oriented, No acute distress. HEENT: [place] AT, moist mucus membranes. Trachea midline, no masses. Cardiovascular: No clubbing, cyanosis, or edema. Respiratory: Normal respiratory effort, no increased work of breathing. GI:  Abdomen is soft, non tender, non distended, no abdominal masses. Liver and spleen not palpable.  No hernias appreciated.  Stool sample for occult testing is not indicated.   GU: No CVA tenderness.  No bladder fullness or masses.  Atrophic external genitalia, normal pubic hair distribution, no lesions.  Normal urethral meatus, no lesions, no prolapse, no discharge.   No urethral masses, tenderness and/or tenderness. No bladder fullness, tenderness or masses. Pale vagina mucosa, poor estrogen effect, no discharge, no lesions, good pelvic support, no cystocele or rectocele noted.  Cervix, uterus and adnexa are surgically absent.  Anus and perineum are without rashes or lesions.    Skin: No rashes, bruises or suspicious lesions. Lymph: No cervical or inguinal adenopathy. Neurologic: Grossly intact, no focal deficits, moving all 4 extremities. Psychiatric: Normal mood and affect.  Laboratory Data: Lab Results  Component Value Date   WBC 7.9 08/26/2015   HGB 13.1 08/26/2015   HCT 38.0 08/26/2015   MCV 88.3 08/26/2015   PLT 249 08/26/2015    Lab Results  Component Value Date   CREATININE 1.19 (H) 08/26/2015    Lab Results  Component Value Date   AST 21  08/26/2015   Lab Results  Component Value Date   ALT 14 08/26/2015    Urinalysis > 30 WBC's and nitrite positive.  See EPIC.  Pertinent Imaging: Results for NARA, GOTWALT (MRN [id number]) as of 10/14/2016 08:41  Ref. Range 10/11/2016 14:00  Scan Result Unknown 50   CLINICAL DATA:  Initial evaluation for acute left hip pain.  EXAM: CT ABDOMEN AND PELVIS WITHOUT CONTRAST  TECHNIQUE: Multidetector CT imaging of the abdomen and pelvis was performed following the standard protocol without IV contrast.  COMPARISON:  Prior study from 08/01/2013.  FINDINGS: Visualized lung bases are clear without acute process. Limited noncontrast evaluation of the liver is unremarkable. Gallbladder is absent. No biliary dilatation. Spleen, adrenal glands, and pancreas demonstrate a normal unenhanced appearance.  Scattered hypodense cysts within the left kidney again seen, overall similar relative to previous study. A 14 mm exophytic hyperdense lesion extending from the left kidney is also similar, likely a proteinaceous or hemorrhagic cyst. Additionally, a tiny 6 mm hyperdensity lesion within the inferior pole left kidney is also similar, also likely a small proteinaceous or hemorrhagic cyst. No hyperdense lesions within the right kidney.  Scattered nonobstructive calculi present within the left kidney, largest of which located in the lower pole and measures 9 mm. No radiopaque calculi seen along the course of the left ureter. There is no left-sided hydronephrosis or hydroureter.  Scattered nonobstructive calculi present within the right kidney, largest of which measures 5 mm in the interpolar region. No radiopaque calculi seen along the course of the right ureter. There is no right-sided hydronephrosis or hydroureter.  Stomach within normal limits. No evidence for bowel obstruction. No abnormal wall thickening or inflammatory changes seen about the bowels.  Bladder is well  distended. Minimal focal wall thickening at the gallbladder fundus (series 2, image 61). Finding is of uncertain significance, but is likely not significantly changed relative to prior CT from 2011 where this was likely present, although the bladder was decompressed at that time.  No free air or fluid. No pathologically enlarged intra-abdominal or pelvic lymph nodes identified. Moderate atheromatous disease within the infrarenal aorta. No aneurysm.  Trace anterolisthesis of L4 on L5 with associated disc bulge and facet arthrosis. Degenerative disc bulge present at L5-S1 as well. No acute osseous abnormality. No worrisome lytic or  blastic osseous lesions. Mild degenerative osteoarthritic changes present about the hips bilaterally.  IMPRESSION: 1. Bilateral nonobstructive nephrolithiasis as above. No CT evidence for obstructive uropathy. 2. Scattered cysts within the bilateral kidneys, with 2 small hyperdense lesions within the left kidney as above. These are not significantly changed relative to prior CT from 08/01/2013, and likely reflect small proteinaceous or hemorrhagic cysts given their relative stability. 3. No other acute intra-abdominal or pelvic process identified. 4. Status post cholecystectomy.   Electronically Signed   By: Jeannine Boga M.D.   On: 08/26/2015 06:59   Assessment & Plan:    1. Dysuria  - UA is suspicious for infection  - UA is sent for culture  - will empirically begin Cipro as patient is in a lot of discomfort, will adjust once sensitives are available if necessary  - patient advised to stop her Zanaflex while she is on the Cipro  - Advised to contact our office or seek treatment in the ED if becomes febrile or pain/ vomiting are difficult control in order to arrange for emergent/urgent intervention  2. Left flank pain  - patient has a history of bilateral nephrolithiasis  - will obtain a CT Renal stone study to see if any of her  stones have become obstructing  - see above  3. Bilateral nephrolithiasis  - would suggest patient under go a 24 hour after acute illness has resolved  Return for CT Renal stone report.  These notes generated with voice recognition software. I apologize for typographical errors.  Zara Council, [facility] [address], [place], [place] [address] [phone]

## 2016-10-13 ENCOUNTER — Telehealth: Payer: Self-pay | Admitting: Urology

## 2016-10-13 LAB — CULTURE, URINE COMPREHENSIVE

## 2016-10-13 NOTE — Telephone Encounter (Signed)
See urine culture

## 2016-10-13 NOTE — Telephone Encounter (Signed)
Pt called stating that she is still burning and lots of pain, has more nurse advice questions. Please advise.

## 2016-10-13 NOTE — Telephone Encounter (Signed)
Your dictation wasn't done. Please advise.

## 2016-10-14 ENCOUNTER — Telehealth: Payer: Self-pay

## 2016-10-14 NOTE — Telephone Encounter (Signed)
-----   Message from Nori Riis, PA-C sent at 10/13/2016 10:28 PM EST ----- Patient has a +UCx.  They need to continue the Cipro.  They also need to take a probiotic with the antibiotic course.  The dosage is listed below:  L. acidophilus and L. casei (25 x 109 CFU/day for 2 days, then 50 x 109 CFU/day for duration of the antibiotic course)

## 2016-10-14 NOTE — Telephone Encounter (Signed)
Spoke with pt in reference to +ucx and cipro. Pt stated that she is still hurting in her groin area. Reinforced with pt pain is most likely due to infection, drink plenty of water, can apply heat, and rest. Pt voiced concern. Please advise.

## 2016-10-14 NOTE — Telephone Encounter (Signed)
Her CT scan is schedule for 10/21/2016.  If she gets in worse pain, starts vomiting or runs a fever over 101, she needs to get to the ED.

## 2016-10-14 NOTE — Telephone Encounter (Signed)
Spoke with pt in reference to groin pain. Reinforced with pt to keep CT appt and f/u with [facility]. Pt voiced understanding.

## 2016-10-17 ENCOUNTER — Telehealth: Payer: Self-pay

## 2016-10-17 NOTE — Telephone Encounter (Signed)
Pt called stating she has 2 cipro pills left but continues to hurt and have dysuria. Reinforced with pt to have CT completed and drink plenty of fluids. Pt voiced understanding.

## 2016-10-21 ENCOUNTER — Ambulatory Visit
Admission: RE | Admit: 2016-10-21 | Discharge: 2016-10-21 | Disposition: A | Discharge status: Home / Self Care | Payer: Medicare Other | Source: Ambulatory Visit | Attending: Urology | Admitting: Urology

## 2016-10-21 DIAGNOSIS — K760 Fatty (change of) liver, not elsewhere classified: Secondary | ICD-10-CM | POA: Insufficient documentation

## 2016-10-21 DIAGNOSIS — I700 Atherosclerosis of aorta: Secondary | ICD-10-CM | POA: Insufficient documentation

## 2016-10-21 DIAGNOSIS — N289 Disorder of kidney and ureter, unspecified: Secondary | ICD-10-CM | POA: Insufficient documentation

## 2016-10-21 DIAGNOSIS — N200 Calculus of kidney: Secondary | ICD-10-CM | POA: Insufficient documentation

## 2016-10-21 DIAGNOSIS — R109 Unspecified abdominal pain: Secondary | ICD-10-CM | POA: Diagnosis not present

## 2016-10-25 ENCOUNTER — Ambulatory Visit (INDEPENDENT_AMBULATORY_CARE_PROVIDER_SITE_OTHER): Payer: Medicare Other | Admitting: Urology

## 2016-10-25 ENCOUNTER — Encounter: Payer: Self-pay | Admitting: Urology

## 2016-10-25 VITALS — BP 130/72 | HR 85 | Ht <= 58 in | Wt 162.9 lb

## 2016-10-25 DIAGNOSIS — N200 Calculus of kidney: Secondary | ICD-10-CM | POA: Diagnosis not present

## 2016-10-25 DIAGNOSIS — R300 Dysuria: Secondary | ICD-10-CM

## 2016-10-25 DIAGNOSIS — N201 Calculus of ureter: Secondary | ICD-10-CM

## 2016-10-25 LAB — URINALYSIS, COMPLETE
BILIRUBIN UA: NEGATIVE
Glucose, UA: NEGATIVE
KETONES UA: NEGATIVE
NITRITE UA: NEGATIVE
RBC UA: NEGATIVE
UUROB: 0.2 mg/dL (ref 0.2–1.0)
pH, UA: 5.5 (ref 5.0–7.5)

## 2016-10-25 LAB — MICROSCOPIC EXAMINATION: Bacteria, UA: NONE SEEN

## 2016-10-25 MED ORDER — CIPROFLOXACIN HCL 500 MG PO TABS: 500.0000 mg | ORAL_TABLET | Freq: Two times a day (BID) | ORAL | 2 refills | Status: DC

## 2016-10-25 NOTE — Patient Instructions (Signed)

## 2016-10-25 NOTE — Progress Notes (Signed)
10/25/2016 3:02 PM   Anne Steele Michiel Cowboy Feb 19, 1941 [id number]  Referring provider: Leonel Ramsay, MD [place], [place] [address]  Chief Complaint  Patient presents with  . Results    CT    HPI: Patient is a 76 -year-old Caucasian female who presents today to discuss her CT Renal stone study report.  Background history Patient is referred to [place] by, Dr. Ola Spurr, for dysuria. Patient states that she has had dysuria for over a week, but it worsened over the weekend.  She is having associated dysuria and nocturia x 2.  She does/does not have a history of urinary tract infections, STI's or injury to the bladder.  She is experiencing left flank pain that is radiating into her left side.  She denies, gross hematuria, suprapubic pain, back pain or abdominal pain.  She has not had any recent fevers, chills, nausea or vomiting. She does have a history of nephrolithiasis.  Her stone composition is unknown at this time.  CT Renal stone study performed on 08/26/2015 noted a bilateral nonobstructive nephrolithiasis as above. No CT evidence for obstructive uropathy.  Scattered cysts within the bilateral kidneys, with 2 small hyperdense lesions within the left kidney as above. These are not significantly changed relative to prior CT from 08/01/2013, and likely reflect small proteinaceous or hemorrhagic cysts given their relative stability.  No other acute intra-abdominal or pelvic process identified.  Status post cholecystectomy.    CT Renal study performed on 10/21/2016 noted bilateral nephrolithiasis, without obstructive uropathy.  Possible constipation.  Multiple renal lesions which are likely cysts and complex cysts, although these are incompletely characterized on this noncontrast.   Exam, relative stability favors benign or indolent lesions.  Hepatic steatosis.  Aortic atherosclerosis  I have independently reviewed the films.    Her UA  today demonstrates > 30 WBC's and nitrite negative.  She feels somewhat better today.  She is still having some vaginal burning and lower back pain.   She denies gross hematuria and suprapubic pain.  She denies fevers, chills, nausea and vomiting.    PMH: Past Medical History:  Diagnosis Date  . Arthritis   . Asthma   . Bell palsy 1973  . Fibromyalgia   . GERD (gastroesophageal reflux disease)   . Head ache   . Hiatal hernia   . Hypercalcemia   . Hypertension   . Kidney stones   . Optic nerve disease     Surgical History: Past Surgical History:  Procedure Laterality Date  . ABDOMINAL HYSTERECTOMY  1982  . BREAST BIOPSY Right 03/04/2016   US biopsy/ INTRADUCTAL PAPILLOMA  . BREAST BIOPSY Left 03/04/2016   Stereo biopsy/ Calcifications  . CARPAL TUNNEL RELEASE Bilateral 2012  . CATARACT EXTRACTION Bilateral 2017  . CHOLECYSTECTOMY  2001  . KNEE SURGERY Left     Home Medications:  Allergies as of 10/25/2016      Reactions   Iodine Itching   Penicillins    Sulfa Antibiotics    Tape    Tetracyclines & Related    Zocor [simvastatin]    Zovirax [acyclovir]       Medication List       Accurate as of 10/25/16  3:02 PM. Always use your most recent med list.          alendronate-cholecalciferol 70-2800 MG-UNIT tablet Commonly known as:  FOSAMAX PLUS D Take 1 tablet by mouth every 7 (seven) days. Take with a full  glass of water on an empty stomach.   busPIRone 10 MG tablet Commonly known as:  BUSPAR Take 10 mg by mouth 2 (two) times daily.   cetirizine 10 MG tablet Commonly known as:  ZYRTEC Take 10 mg by mouth daily.   cholecalciferol 400 UNIT/ML Liqd Commonly known as:  D-VI-SOL Take 400 Units by mouth daily.   ciprofloxacin 500 MG tablet Commonly known as:  CIPRO Take 1 tablet (500 mg total) by mouth every 12 (twelve) hours.   cyanocobalamin 1000 MCG tablet Take 100 mcg by mouth daily.   diazepam 5 MG tablet Commonly known as:  VALIUM Take 1 tablet (5 mg  total) by mouth at bedtime as needed for muscle spasms.   diclofenac 1.3 % Ptch Commonly known as:  FLECTOR Place 1 patch onto the skin 2 (two) times daily.   enalapril 5 MG tablet Commonly known as:  VASOTEC Take 5 mg by mouth daily.   fluticasone 50 MCG/ACT nasal spray Commonly known as:  FLONASE Place 2 sprays into both nostrils daily.   hydrochlorothiazide 25 MG tablet Commonly known as:  HYDRODIURIL Take 25 mg by mouth daily.   hyoscyamine 0.125 MG tablet Commonly known as:  LEVSIN, ANASPAZ Take by mouth.   latanoprost 0.005 % ophthalmic solution Commonly known as:  XALATAN Place 1 drop into both eyes at bedtime.   magnesium oxide 400 MG tablet Commonly known as:  MAG-OX Take 400 mg by mouth daily.   metoprolol succinate 25 MG 24 hr tablet Commonly known as:  TOPROL-XL Take 25 mg by mouth daily.   omeprazole 20 MG capsule Commonly known as:  PRILOSEC Take 20 mg by mouth 2 (two) times daily before a meal.   tiZANidine 2 MG tablet Commonly known as:  ZANAFLEX TAKE 1 TABLET BY MOUTH AT BEDTIME AS NEEDED.       Allergies:  Allergies  Allergen Reactions  . Iodine Itching  . Penicillins   . Sulfa Antibiotics   . Tape   . Tetracyclines & Related   . Zocor [Simvastatin]   . Zovirax [Acyclovir]     Family History: Family History  Problem Relation Age of Onset  . Leukemia Mother   . Diabetes Father   . Breast cancer Sister   . Brain cancer Brother   . Diabetes Sister   . Diabetes Brother   . Prostate cancer Neg Hx   . Kidney cancer Neg Hx   . Bladder Cancer Neg Hx     Social History:  reports that she has never smoked. She has never used smokeless tobacco. She reports that she does not drink alcohol or use drugs.  ROS: UROLOGY Frequent Urination?: No Hard to postpone urination?: No Burning/pain with urination?: No Get up at night to urinate?: No Leakage of urine?: Yes Urine stream starts and stops?: No Trouble starting stream?: No Do you  have to strain to urinate?: No Blood in urine?: No Urinary tract infection?: No Sexually transmitted disease?: No Injury to kidneys or bladder?: No Painful intercourse?: No Weak stream?: No Currently pregnant?: No Vaginal bleeding?: No Last menstrual period?: n  Gastrointestinal Nausea?: No Vomiting?: No Indigestion/heartburn?: No Diarrhea?: No Constipation?: No  Constitutional Fever: No Night sweats?: No Weight loss?: No Fatigue?: No  Skin Skin rash/lesions?: No Itching?: No  Eyes Blurred vision?: No Double vision?: No  Ears/Nose/Throat Sore throat?: No Sinus problems?: No  Hematologic/Lymphatic Swollen glands?: No Easy bruising?: No  Cardiovascular Leg swelling?: No Chest pain?: No  Respiratory Cough?: No Shortness  of breath?: No  Endocrine Excessive thirst?: No  Musculoskeletal Back pain?: Yes Joint pain?: Yes  Neurological Headaches?: No Dizziness?: No  Psychologic Depression?: No Anxiety?: No  Physical Exam: BP 130/72   Pulse 85   Ht 4\' 10"  (1.473 m)   Wt 162 lb 14.4 oz (73.9 kg)   BMI 34.05 kg/m   Constitutional: Well nourished. Alert and oriented, No acute distress. HEENT: [place] AT, moist mucus membranes. Trachea midline, no masses. Cardiovascular: No clubbing, cyanosis, or edema. Respiratory: Normal respiratory effort, no increased work of breathing. Skin: No rashes, bruises or suspicious lesions. Lymph: No cervical or inguinal adenopathy. Neurologic: Grossly intact, no focal deficits, moving all 4 extremities. Psychiatric: Normal mood and affect.  Laboratory Data: Lab Results  Component Value Date   WBC 7.9 08/26/2015   HGB 13.1 08/26/2015   HCT 38.0 08/26/2015   MCV 88.3 08/26/2015   PLT 249 08/26/2015    Lab Results  Component Value Date   CREATININE 1.19 (H) 08/26/2015    Lab Results  Component Value Date   AST 21 08/26/2015   Lab Results  Component Value Date   ALT 14 08/26/2015    Urinalysis > 30  WBC's and nitrite positive.  See EPIC.  Pertinent Imaging: CLINICAL DATA:  Left flank pain.  Dysuria.  EXAM: CT ABDOMEN AND PELVIS WITHOUT CONTRAST  TECHNIQUE: Multidetector CT imaging of the abdomen and pelvis was performed following the standard protocol without IV contrast.  COMPARISON:  08/26/2015  FINDINGS: Lower chest: Clear lung bases. Normal heart size without pericardial or pleural effusion.  Hepatobiliary: Mild hepatic steatosis. Caudate lobe enlargement, without specific evidence of cirrhosis. Cholecystectomy, without biliary ductal dilatation.  Pancreas: Normal, without mass or ductal dilatation.  Spleen: Normal in size, without focal abnormality.  Adrenals/Urinary Tract: Normal adrenal glands. Bilateral renal collecting system calculi, including an 8 mm stone in the lower pole left kidney. No hydronephrosis. No hydroureter or ureteric calculi. No bladder calculi.  An upper pole left renal lesion measures greater than fluid density and 2.4 cm on image 22/ series 2. This lesion was similar in size and fluid density on the prior, favoring a complex cyst today.  An interpolar left renal hyperattenuating lesion is similar at 1.3 cm. 7 mm lower pole left renal hyperattenuating lesion is also similar. Low-density left renal lesions are also identified and likely cysts. Minimal calcification associated with upper pole left renal lesions, favoring complexity. Similar.  Stomach/Bowel: Normal stomach, without wall thickening. Colonic stool burden suggests constipation. Normal terminal ileum. Normal small bowel.  Vascular/Lymphatic: Aortic and branch vessel atherosclerosis. No abdominopelvic adenopathy.  Reproductive: Hysterectomy.  No adnexal mass.  Other: No significant free fluid.  Musculoskeletal: Lumbosacral spondylosis with advanced degenerative disc disease at the lumbosacral junction.  IMPRESSION: 1. Bilateral nephrolithiasis, without  obstructive uropathy. 2.  Possible constipation. 3. Multiple renal lesions which are likely cysts and complex cysts. although these are incompletely characterized on this noncontrast exam, relative stability favors benign or indolent lesions. 4. Hepatic steatosis. 5.  Aortic atherosclerosis.   Electronically Signed   By: Abigail Miyamoto M.D.   On: 10/21/2016 12:16   Assessment & Plan:    1. Dysuria  - mostly resolved  - urine culture was positive for E.Coli  - UA is suspicious for infection  - UA is sent for culture  - continue Cipro as patient is in a lot of discomfort, will adjust once sensitives are available if necessary  - patient advised to stop her Zanaflex while she  is on the Cipro  - Advised to contact our office or seek treatment in the ED if becomes febrile or pain/ vomiting are difficult control in order to arrange for emergent/urgent intervention  2. Left flank pain  - patient has a history of bilateral nephrolithiasis  - no ureteral stones seen on CT Renal stone study   - constipation seen on CT - encouraged stool softner    3. Bilateral nephrolithiasis  - would suggest patient under go a 24 hour after acute illness has resolved  Return for RTC pending on urine culture.  These notes generated with voice recognition software. I apologize for typographical errors.  Zara Council, [facility] [address] [place], [place] [address] [phone]

## 2016-10-27 LAB — CULTURE, URINE COMPREHENSIVE

## 2016-10-31 ENCOUNTER — Telehealth: Payer: Self-pay

## 2016-10-31 NOTE — Telephone Encounter (Signed)
-----   Message from Nori Riis, PA-C sent at 10/28/2016  8:18 AM EST ----- Please notify the patient that her urine culture was negative.

## 2016-10-31 NOTE — Telephone Encounter (Signed)
Spoke with pt in reference to -ucx. Pt voiced understanding.  

## 2017-01-01 IMAGING — CT CT RENAL STONE PROTOCOL
2 of 4 series · 16 of 46 positions shown, 18 images · non-contrast
Comparison: Prior study from 08/01/2013.

CLINICAL DATA: Initial evaluation for acute left hip pain.

EXAM:
CT ABDOMEN AND PELVIS WITHOUT CONTRAST
TECHNIQUE: Multidetector CT imaging of the abdomen and pelvis was performed
following the standard protocol without IV contrast.

[Series 2: stone standard full · axial · 0.75mm/px · z∈[-830,-460]mm · 13 of 82 slices shown, 15 images]
[im 4/82  soft-tissue]
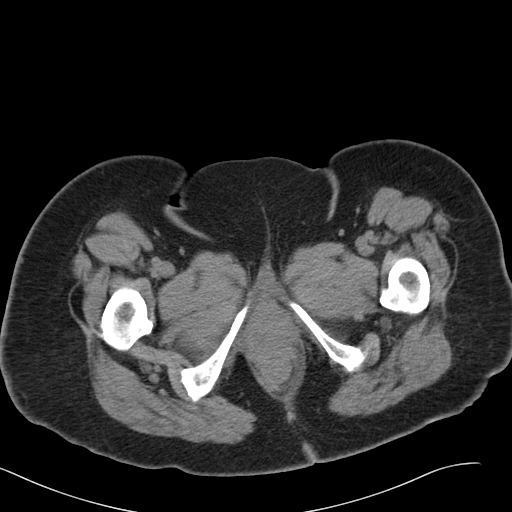
[im 4/82  bone]
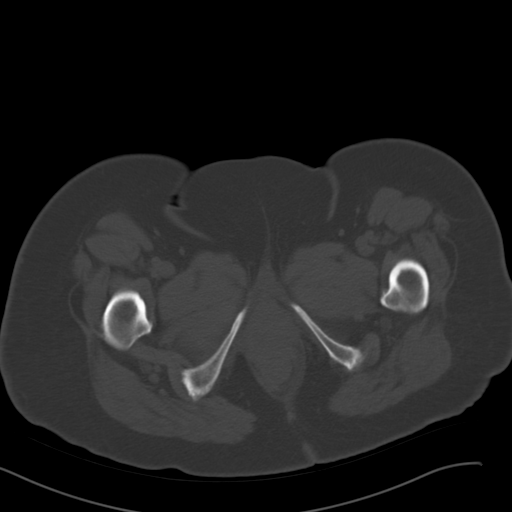
[im 11/82  soft-tissue]
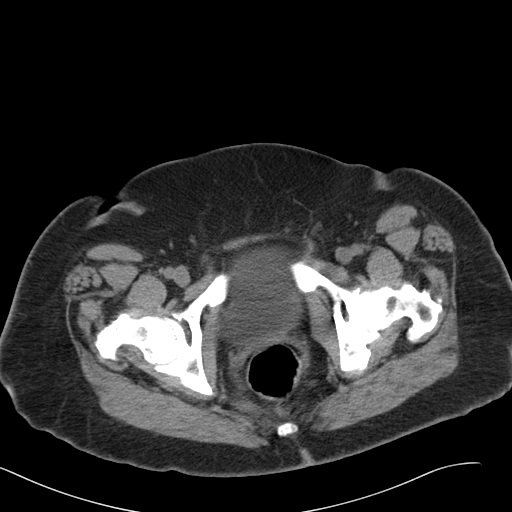
[im 17/82  soft-tissue]
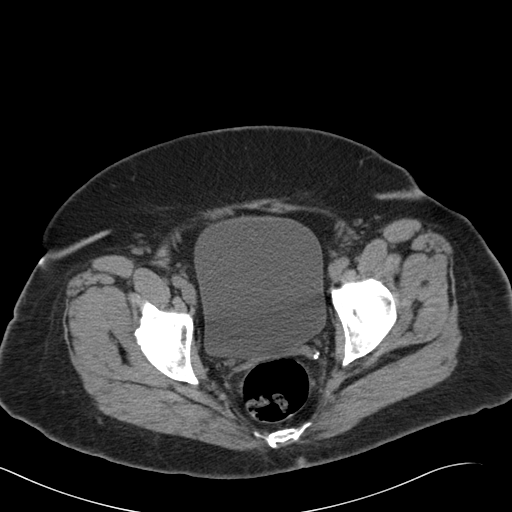
[im 24/82  soft-tissue]
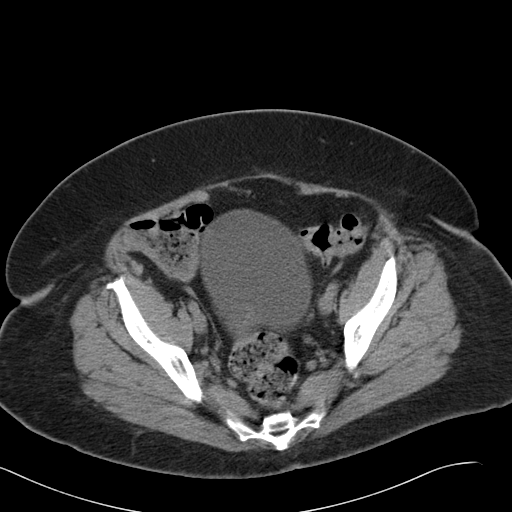
[im 28/82  soft-tissue]
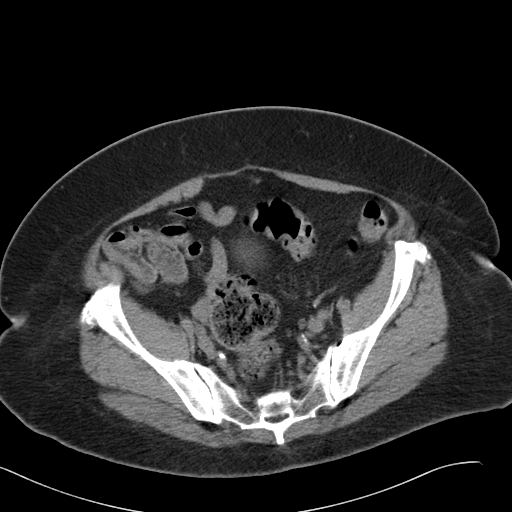
[im 34/82  soft-tissue]
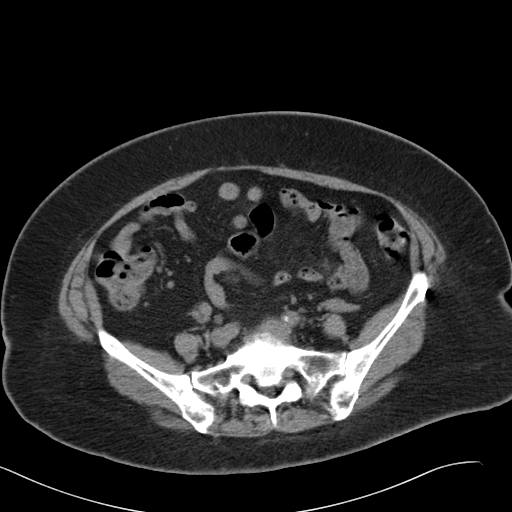
[im 41/82  soft-tissue]
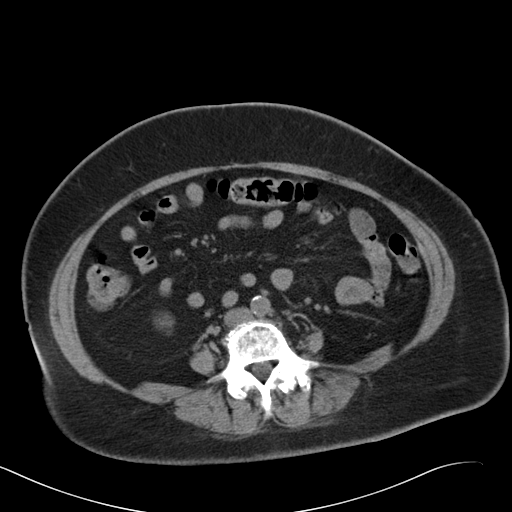
[im 48/82  soft-tissue]
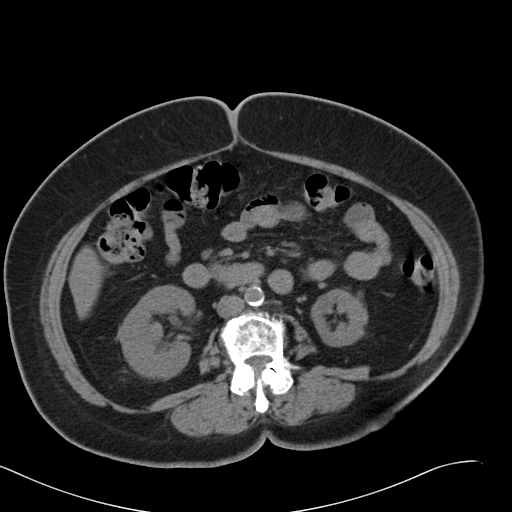
[im 55/82  soft-tissue]
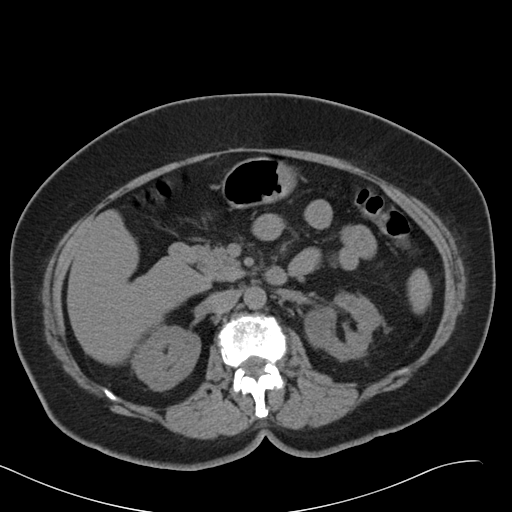
[im 55/82  bone]
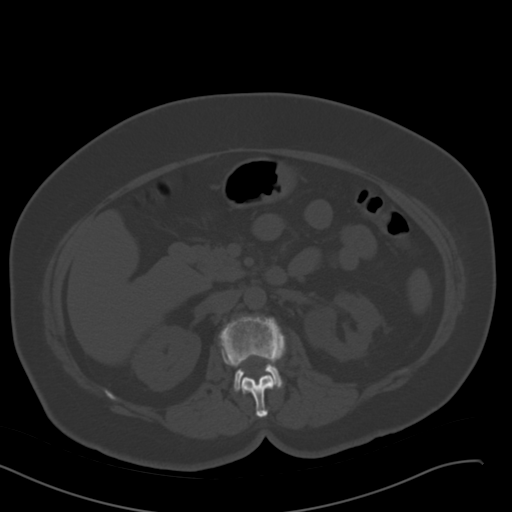
[im 58/82  soft-tissue]
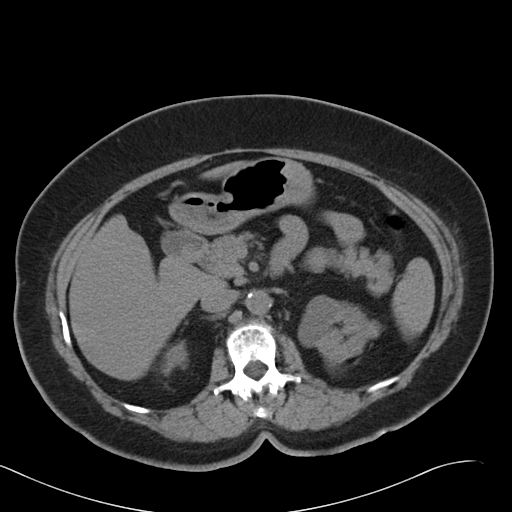
[im 65/82  soft-tissue]
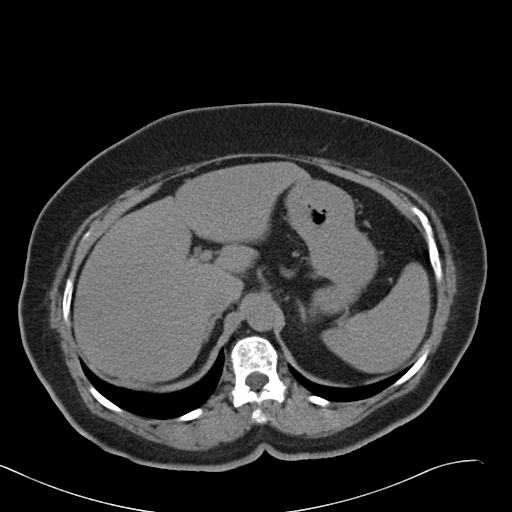
[im 71/82  soft-tissue]
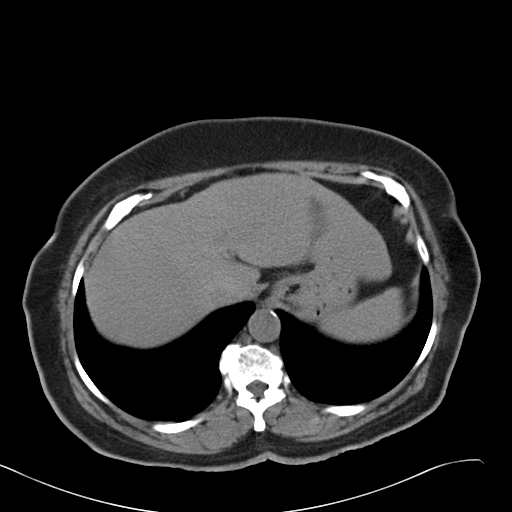
[im 78/82  soft-tissue]
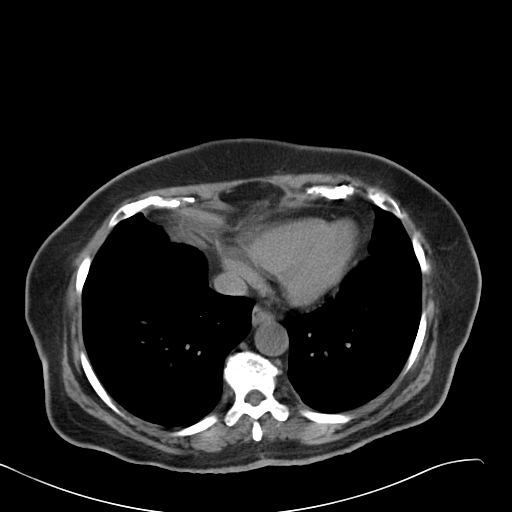

[Series 5: cor stone standard full · coronal · 0.92mm/px · 3 of 138 slices shown]
[im 46/138  soft-tissue]
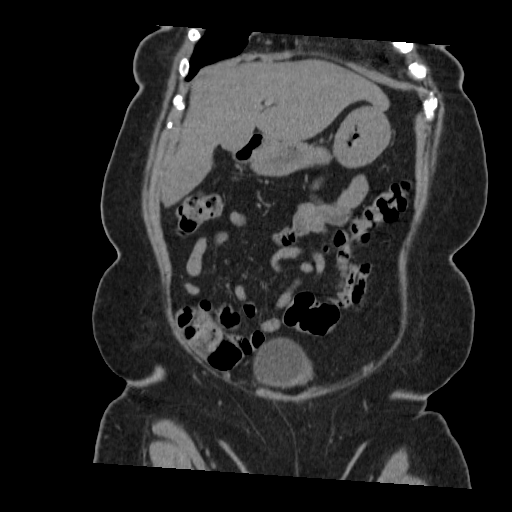
[im 61/138  soft-tissue]
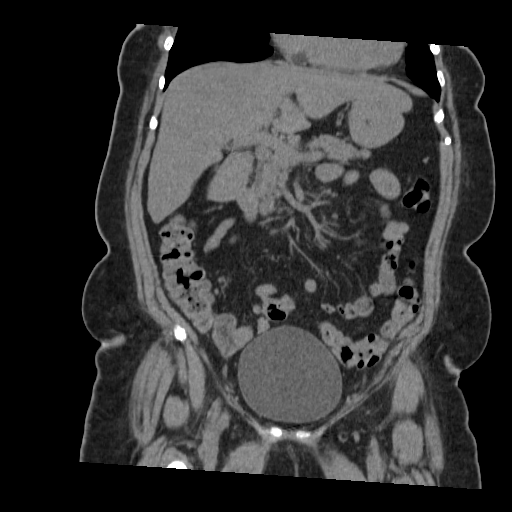
[im 77/138  soft-tissue]
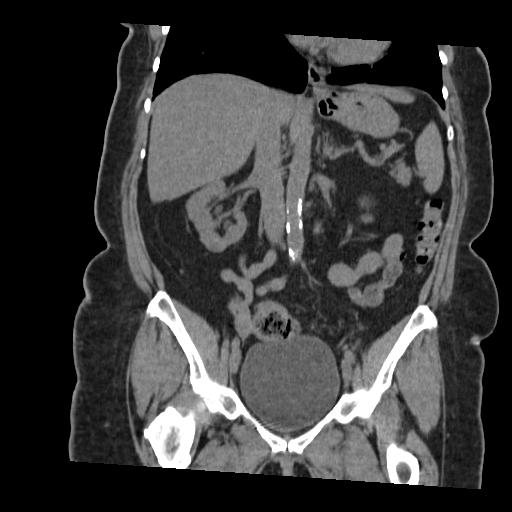

[16 of 46 positions shown; findings below may reference images not displayed]

FINDINGS: Visualized lung bases are clear without acute process. Limited
noncontrast evaluation of the liver is unremarkable. Gallbladder is
absent. No biliary dilatation. Spleen, adrenal glands, and pancreas
demonstrate a normal unenhanced appearance.

Scattered hypodense cysts within the left kidney again seen, overall
similar relative to previous study. A 14 mm exophytic hyperdense
lesion extending from the left kidney is also similar, likely a
proteinaceous or hemorrhagic cyst. Additionally, a tiny 6 mm
hyperdensity lesion within the inferior pole left kidney is also
similar, also likely a small proteinaceous or hemorrhagic cyst. No
hyperdense lesions within the right kidney.

Scattered nonobstructive calculi present within the left kidney,
largest of which located in the lower pole and measures 9 mm. No
radiopaque calculi seen along the course of the left ureter. There
is no left-sided hydronephrosis or hydroureter.

Scattered nonobstructive calculi present within the right kidney,
largest of which measures 5 mm in the interpolar region. No
radiopaque calculi seen along the course of the right ureter. There
is no right-sided hydronephrosis or hydroureter.

Stomach within normal limits. No evidence for bowel obstruction. No
abnormal wall thickening or inflammatory changes seen about the
bowels.

Bladder is well distended. Minimal focal wall thickening at the
gallbladder fundus (series 2, image 61). Finding is of uncertain
significance, but is likely not significantly changed relative to
prior CT from 7999 where this was likely present, although the
bladder was decompressed at that time.

No free air or fluid. No pathologically enlarged intra-abdominal or
pelvic lymph nodes identified. Moderate atheromatous disease within
the infrarenal aorta. No aneurysm.

Trace anterolisthesis of L4 on L5 with associated disc bulge and
facet arthrosis. Degenerative disc bulge present at L5-S1 as well.
No acute osseous abnormality. No worrisome lytic or blastic osseous
lesions. Mild degenerative osteoarthritic changes present about the
hips bilaterally.
IMPRESSION: 1. Bilateral nonobstructive nephrolithiasis as above. No CT evidence
for obstructive uropathy.
2. Scattered cysts within the bilateral kidneys, with 2 small
hyperdense lesions within the left kidney as above. These are not
significantly changed relative to prior CT from 08/01/2013, and
likely reflect small proteinaceous or hemorrhagic cysts given their
relative stability.
3. No other acute intra-abdominal or pelvic process identified.
4. Status post cholecystectomy.

## 2017-06-09 DIAGNOSIS — M51369 Other intervertebral disc degeneration, lumbar region without mention of lumbar back pain or lower extremity pain: Secondary | ICD-10-CM | POA: Insufficient documentation

## 2017-06-09 DIAGNOSIS — M5136 Other intervertebral disc degeneration, lumbar region: Secondary | ICD-10-CM | POA: Insufficient documentation

## 2017-06-09 DIAGNOSIS — E669 Obesity, unspecified: Secondary | ICD-10-CM | POA: Insufficient documentation

## 2017-06-16 ENCOUNTER — Ambulatory Visit (INDEPENDENT_AMBULATORY_CARE_PROVIDER_SITE_OTHER): Payer: Medicare Other | Admitting: Family Medicine

## 2017-06-16 VITALS — BP 147/83 | HR 85 | Ht <= 58 in | Wt 163.1 lb

## 2017-06-16 DIAGNOSIS — R300 Dysuria: Secondary | ICD-10-CM

## 2017-06-16 LAB — URINALYSIS, COMPLETE
Bilirubin, UA: NEGATIVE
Ketones, UA: NEGATIVE
NITRITE UA: POSITIVE — AB
RBC, UA: NEGATIVE
Specific Gravity, UA: <1.005 — ABNORMAL LOW (ref 1.005–1.030)
Urobilinogen, Ur: 2 mg/dL — ABNORMAL HIGH (ref 0.2–1.0)
pH, UA: 5 (ref 5.0–7.5)

## 2017-06-16 LAB — MICROSCOPIC EXAMINATION: RBC MICROSCOPIC, UA: NONE SEEN /HPF (ref 0–?)

## 2017-06-16 MED ORDER — NITROFURANTOIN MONOHYD MACRO 100 MG PO CAPS: 100.0000 mg | ORAL_CAPSULE | Freq: Two times a day (BID) | ORAL | 0 refills | Status: DC

## 2017-06-16 NOTE — Progress Notes (Signed)
Patient presents today with dysuria and urinary frequency, nausea. She has not been on antibiotics or had any Urological surgeries in the last 30 days. She is allergic to Sulfa antibiotics.

## 2017-06-20 LAB — CULTURE, URINE COMPREHENSIVE

## 2017-11-03 ENCOUNTER — Other Ambulatory Visit: Payer: Self-pay | Admitting: Infectious Diseases

## 2017-11-06 ENCOUNTER — Other Ambulatory Visit: Payer: Self-pay | Admitting: Infectious Diseases

## 2017-11-06 DIAGNOSIS — Z1239 Encounter for other screening for malignant neoplasm of breast: Secondary | ICD-10-CM

## 2017-11-06 DIAGNOSIS — D241 Benign neoplasm of right breast: Secondary | ICD-10-CM

## 2017-11-06 DIAGNOSIS — N6320 Unspecified lump in the left breast, unspecified quadrant: Secondary | ICD-10-CM

## 2017-11-24 ENCOUNTER — Other Ambulatory Visit: Payer: Medicare Other

## 2017-11-24 ENCOUNTER — Ambulatory Visit: Payer: Medicare Other

## 2017-12-08 ENCOUNTER — Ambulatory Visit
Admission: RE | Admit: 2017-12-08 | Discharge: 2017-12-08 | Disposition: A | Discharge status: Home / Self Care | Payer: Medicare Other | Source: Ambulatory Visit | Attending: Infectious Diseases | Admitting: Infectious Diseases

## 2017-12-08 DIAGNOSIS — Z1239 Encounter for other screening for malignant neoplasm of breast: Secondary | ICD-10-CM

## 2017-12-08 DIAGNOSIS — N6320 Unspecified lump in the left breast, unspecified quadrant: Secondary | ICD-10-CM

## 2017-12-08 DIAGNOSIS — D241 Benign neoplasm of right breast: Secondary | ICD-10-CM | POA: Insufficient documentation

## 2018-01-12 ENCOUNTER — Other Ambulatory Visit: Payer: Self-pay | Admitting: Orthopedic Surgery

## 2018-01-12 DIAGNOSIS — M5136 Other intervertebral disc degeneration, lumbar region: Secondary | ICD-10-CM

## 2018-01-12 DIAGNOSIS — M5440 Lumbago with sciatica, unspecified side: Principal | ICD-10-CM

## 2018-01-12 DIAGNOSIS — M545 Low back pain, unspecified: Secondary | ICD-10-CM

## 2018-01-19 ENCOUNTER — Ambulatory Visit
Admission: RE | Admit: 2018-01-19 | Discharge: 2018-01-19 | Disposition: A | Discharge status: Home / Self Care | Payer: Medicare Other | Source: Ambulatory Visit | Attending: Orthopedic Surgery | Admitting: Orthopedic Surgery

## 2018-01-19 DIAGNOSIS — M5136 Other intervertebral disc degeneration, lumbar region: Secondary | ICD-10-CM | POA: Diagnosis not present

## 2018-01-19 DIAGNOSIS — M47816 Spondylosis without myelopathy or radiculopathy, lumbar region: Secondary | ICD-10-CM | POA: Insufficient documentation

## 2018-01-19 DIAGNOSIS — M48061 Spinal stenosis, lumbar region without neurogenic claudication: Secondary | ICD-10-CM | POA: Insufficient documentation

## 2018-01-19 DIAGNOSIS — M2578 Osteophyte, vertebrae: Secondary | ICD-10-CM | POA: Diagnosis not present

## 2018-01-19 DIAGNOSIS — M545 Low back pain, unspecified: Secondary | ICD-10-CM

## 2018-01-19 DIAGNOSIS — M5440 Lumbago with sciatica, unspecified side: Secondary | ICD-10-CM

## 2018-05-01 DIAGNOSIS — M5416 Radiculopathy, lumbar region: Secondary | ICD-10-CM | POA: Insufficient documentation

## 2018-05-01 DIAGNOSIS — F330 Major depressive disorder, recurrent, mild: Secondary | ICD-10-CM | POA: Insufficient documentation

## 2018-05-03 NOTE — Progress Notes (Signed)
05/04/2018 11:00 AM   Salem Senate 1940/10/13 [id number]  Referring provider: Leonel Ramsay, MD [place], [place] [address]  Chief Complaint  Patient presents with  . Follow-up    HPI: Patient is a 77 -year-old Caucasian female with bilateral nephrolithiasis and bilateral renal cysts who presents today for follow up.    Background history Patient is referred to [place] by, Dr. Ola Spurr, for dysuria. Patient states that she has had dysuria for over a week, but it worsened over the weekend.  She is having associated dysuria and nocturia x 2.  She does/does not have a history of urinary tract infections, STI's or injury to the bladder.  She is experiencing left flank pain that is radiating into her left side.  She denies, gross hematuria, suprapubic pain, back pain or abdominal pain.  She has not had any recent fevers, chills, nausea or vomiting. She does have a history of nephrolithiasis.  Her stone composition is unknown at this time.  CT Renal stone study performed on 08/26/2015 noted a bilateral nonobstructive nephrolithiasis as above. No CT evidence for obstructive uropathy.  Scattered cysts within the bilateral kidneys, with 2 small hyperdense lesions within the left kidney as above. These are not significantly changed relative to prior CT from 08/01/2013, and likely reflect small proteinaceous or hemorrhagic cysts given their relative stability.  No other acute intra-abdominal or pelvic process identified.  Status post cholecystectomy.    CT Renal study performed on 10/21/2016 noted bilateral nephrolithiasis, without obstructive uropathy.  Possible constipation.  Multiple renal lesions which are likely cysts and complex cysts, although these are incompletely characterized on this noncontrast.   Exam, relative stability favors benign or indolent lesions.  Hepatic steatosis.  Aortic atherosclerosis  I have independently reviewed the films.    She states she has  burning and itching in her vaginal area.  She feels bloated and she is needing to wear a pad for incontinence.  Patient denies any gross hematuria, dysuria or suprapubic pain.  Patient denies any fevers, chills, nausea or vomiting.   She is having pain in her left back and she is worried that her stone has migrated out of her kidney.  Her pain is made worse by movement and radiates into her left hip.      PMH: Past Medical History:  Diagnosis Date  . Arthritis   . Asthma   . Bell palsy 1973  . Fibromyalgia   . GERD (gastroesophageal reflux disease)   . Head ache   . Hiatal hernia   . Hypercalcemia   . Hypertension   . Kidney stones   . Optic nerve disease     Surgical History: Past Surgical History:  Procedure Laterality Date  . ABDOMINAL HYSTERECTOMY  1982  . BREAST BIOPSY Right 03/04/2016   US biopsy/ INTRADUCTAL PAPILLOMA  . BREAST BIOPSY Left 03/04/2016   Fibroadenoma  . CARPAL TUNNEL RELEASE Bilateral 2012  . CATARACT EXTRACTION Bilateral 2017  . CHOLECYSTECTOMY  2001  . KNEE SURGERY Left     Home Medications:  Allergies as of 05/04/2018      Reactions   Iodine Itching   Penicillins    Sulfa Antibiotics    Tape    Tetracyclines & Related    Zocor [simvastatin]    Zovirax [acyclovir]       Medication List        Accurate as of 05/04/18 11:00 AM. Always use your most recent med list.  alendronate-cholecalciferol 70-2800 MG-UNIT tablet Commonly known as:  FOSAMAX PLUS D Take 1 tablet by mouth every 7 (seven) days. Take with a full glass of water on an empty stomach.   busPIRone 10 MG tablet Commonly known as:  BUSPAR Take 10 mg by mouth 2 (two) times daily.   cetirizine 10 MG tablet Commonly known as:  ZYRTEC Take 10 mg by mouth daily.   cholecalciferol 400 UNIT/ML Liqd Commonly known as:  D-VI-SOL Take 400 Units by mouth daily.   cyanocobalamin 1000 MCG tablet Take 100 mcg by mouth daily.   diclofenac 1.3 % Ptch Commonly known as:   FLECTOR Place 1 patch onto the skin 2 (two) times daily.   DULoxetine 30 MG capsule Commonly known as:  CYMBALTA Take by mouth.   enalapril 5 MG tablet Commonly known as:  VASOTEC Take 5 mg by mouth daily.   fluticasone 50 MCG/ACT nasal spray Commonly known as:  FLONASE Place 2 sprays into both nostrils daily.   hydrochlorothiazide 25 MG tablet Commonly known as:  HYDRODIURIL Take 25 mg by mouth daily.   hyoscyamine 0.125 MG tablet Commonly known as:  LEVSIN, ANASPAZ Take by mouth.   latanoprost 0.005 % ophthalmic solution Commonly known as:  XALATAN Place 1 drop into both eyes at bedtime.   magnesium oxide 400 MG tablet Commonly known as:  MAG-OX Take 400 mg by mouth daily.   metoprolol succinate 25 MG 24 hr tablet Commonly known as:  TOPROL-XL Take 25 mg by mouth daily.   omeprazole 20 MG capsule Commonly known as:  PRILOSEC Take 20 mg by mouth 2 (two) times daily before a meal.   tiZANidine 2 MG tablet Commonly known as:  ZANAFLEX TAKE 1 TABLET BY MOUTH AT BEDTIME AS NEEDED.       Allergies:  Allergies  Allergen Reactions  . Iodine Itching  . Penicillins   . Sulfa Antibiotics   . Tape   . Tetracyclines & Related   . Zocor [Simvastatin]   . Zovirax [Acyclovir]     Family History: Family History  Problem Relation Age of Onset  . Leukemia Mother   . Diabetes Father   . Breast cancer Sister   . Brain cancer Brother   . Diabetes Sister   . Diabetes Brother   . Prostate cancer Neg Hx   . Kidney cancer Neg Hx   . Bladder Cancer Neg Hx     Social History:  reports that she has never smoked. She has never used smokeless tobacco. She reports that she does not drink alcohol or use drugs.  ROS: UROLOGY Frequent Urination?: No Hard to postpone urination?: No Burning/pain with urination?: No Get up at night to urinate?: No Leakage of urine?: No Urine stream starts and stops?: No Trouble starting stream?: No Do you have to strain to urinate?:  No Blood in urine?: No Urinary tract infection?: No Sexually transmitted disease?: No Injury to kidneys or bladder?: No Painful intercourse?: No Weak stream?: No Currently pregnant?: No Vaginal bleeding?: No Last menstrual period?: n  Gastrointestinal Nausea?: No Vomiting?: No Indigestion/heartburn?: No Diarrhea?: No Constipation?: No  Constitutional Fever: No Night sweats?: No Weight loss?: No Fatigue?: No  Skin Skin rash/lesions?: No Itching?: No  Eyes Blurred vision?: No Double vision?: No  Ears/Nose/Throat Sore throat?: No Sinus problems?: Yes  Hematologic/Lymphatic Swollen glands?: No Easy bruising?: No  Cardiovascular Leg swelling?: No Chest pain?: No  Respiratory Cough?: No Shortness of breath?: No  Endocrine Excessive thirst?: No  Musculoskeletal Back  pain?: Yes Joint pain?: Yes  Neurological Headaches?: No Dizziness?: No  Psychologic Depression?: No Anxiety?: No  Physical Exam: BP (!) 158/73   Pulse 80   Ht 4\' 11"  (1.499 m)   Wt 164 lb (74.4 kg)   BMI 33.12 kg/m   Constitutional: Well nourished. Alert and oriented, No acute distress. HEENT: [place] AT, moist mucus membranes. Trachea midline, no masses. Cardiovascular: No clubbing, cyanosis, or edema. Respiratory: Normal respiratory effort, no increased work of breathing. GI: Abdomen is soft, non tender, non distended, no abdominal masses. Liver and spleen not palpable.  No hernias appreciated.  Stool sample for occult testing is not indicated.   GU: No CVA tenderness.  No bladder fullness or masses.   Skin: No rashes, bruises or suspicious lesions. Lymph: No cervical or inguinal adenopathy. Neurologic: Grossly intact, no focal deficits, moving all 4 extremities. Psychiatric: Normal mood and affect.   Laboratory Data: Lab Results  Component Value Date   WBC 7.9 08/26/2015   HGB 13.1 08/26/2015   HCT 38.0 08/26/2015   MCV 88.3 08/26/2015   PLT 249 08/26/2015    Lab  Results  Component Value Date   CREATININE 1.19 (H) 08/26/2015    Lab Results  Component Value Date   AST 21 08/26/2015   Lab Results  Component Value Date   ALT 14 08/26/2015    Pertinent Imaging: Multiple simple and complex renal cysts are present bilaterally, similar to previous studies. There is a 1.4 cm T1 hyperintense and T2 hypointense lesion in the interpolar region of the left kidney (image 7/5), corresponding with a hyperdense lesion on prior CT, probably a complex cyst.  June 2019 lumbar MRI    Assessment & Plan:    1. Bilateral renal cysts Will obtain RUS   2. Bilateral nephrolithiasis Will obtain a RUS to rule out hydronephrosis; her pain is most likely MSK  3. Vaginal atrophy She has an area in her breast that they are monitoring - will hold on prescribing vaginal estrogen cream    Return for RUS report .  These notes generated with voice recognition software. I apologize for typographical errors.  Zara Council, [facility] [address], [place], [place] [address] [phone]

## 2018-05-04 ENCOUNTER — Encounter: Payer: Self-pay | Admitting: Urology

## 2018-05-04 ENCOUNTER — Ambulatory Visit (INDEPENDENT_AMBULATORY_CARE_PROVIDER_SITE_OTHER): Payer: Medicare Other | Admitting: Urology

## 2018-05-04 VITALS — BP 158/73 | HR 80 | Ht 59.0 in | Wt 164.0 lb

## 2018-05-04 DIAGNOSIS — N281 Cyst of kidney, acquired: Secondary | ICD-10-CM | POA: Diagnosis not present

## 2018-05-04 DIAGNOSIS — N952 Postmenopausal atrophic vaginitis: Secondary | ICD-10-CM

## 2018-05-04 DIAGNOSIS — N200 Calculus of kidney: Secondary | ICD-10-CM

## 2018-05-30 ENCOUNTER — Ambulatory Visit
Admission: RE | Admit: 2018-05-30 | Discharge: 2018-05-30 | Disposition: A | Discharge status: Home / Self Care | Payer: Medicare Other | Source: Ambulatory Visit | Attending: Urology | Admitting: Urology

## 2018-05-30 DIAGNOSIS — N281 Cyst of kidney, acquired: Secondary | ICD-10-CM | POA: Insufficient documentation

## 2018-05-30 DIAGNOSIS — R9341 Abnormal radiologic findings on diagnostic imaging of renal pelvis, ureter, or bladder: Secondary | ICD-10-CM | POA: Diagnosis not present

## 2018-05-30 DIAGNOSIS — N200 Calculus of kidney: Secondary | ICD-10-CM | POA: Diagnosis present

## 2018-06-08 ENCOUNTER — Ambulatory Visit: Payer: Medicare Other | Admitting: Urology

## 2018-06-15 ENCOUNTER — Other Ambulatory Visit
Admission: RE | Admit: 2018-06-15 | Discharge: 2018-06-15 | Disposition: A | Discharge status: Home / Self Care | Payer: Medicare Other | Source: Ambulatory Visit | Attending: Urology | Admitting: Urology

## 2018-06-15 ENCOUNTER — Encounter: Payer: Self-pay | Admitting: Urology

## 2018-06-15 ENCOUNTER — Ambulatory Visit (INDEPENDENT_AMBULATORY_CARE_PROVIDER_SITE_OTHER): Payer: Medicare Other | Admitting: Urology

## 2018-06-15 VITALS — BP 132/67 | HR 80 | Ht 59.0 in | Wt 163.0 lb

## 2018-06-15 DIAGNOSIS — N281 Cyst of kidney, acquired: Secondary | ICD-10-CM | POA: Diagnosis present

## 2018-06-15 DIAGNOSIS — N200 Calculus of kidney: Secondary | ICD-10-CM | POA: Diagnosis not present

## 2018-06-15 DIAGNOSIS — N952 Postmenopausal atrophic vaginitis: Secondary | ICD-10-CM

## 2018-06-15 DIAGNOSIS — N3946 Mixed incontinence: Secondary | ICD-10-CM

## 2018-06-15 LAB — CREATININE, SERUM
Creatinine, Ser: 1.1 mg/dL — ABNORMAL HIGH (ref 0.44–1.00)
GFR calc non Af Amer: 47 mL/min — ABNORMAL LOW (ref >60)
GFR, EST AFRICAN AMERICAN: 55 mL/min — AB (ref >60)

## 2018-06-15 LAB — URINALYSIS, COMPLETE (UACMP) WITH MICROSCOPIC
BILIRUBIN URINE: NEGATIVE
Bacteria, UA: NONE SEEN
GLUCOSE, UA: NEGATIVE mg/dL
Hgb urine dipstick: NEGATIVE
KETONES UR: NEGATIVE mg/dL
LEUKOCYTES UA: NEGATIVE
NITRITE: NEGATIVE
PROTEIN: NEGATIVE mg/dL
RBC / HPF: NONE SEEN RBC/hpf (ref 0–5)
SQUAMOUS EPITHELIAL / LPF: NONE SEEN (ref 0–5)
Specific Gravity, Urine: 1.015 (ref 1.005–1.030)
pH: 6 (ref 5.0–8.0)

## 2018-06-15 LAB — BUN: BUN: 27 mg/dL — AB (ref 8–23)

## 2018-06-15 NOTE — Progress Notes (Signed)
06/15/2018 7:31 PM   Anne Steele Anne Steele March 20, 1941 [id number]  Referring provider: Leonel Ramsay, MD [place], [place] [address]  Chief Complaint  Patient presents with  . Results    RUS    HPI: Patient is a 77 -year-old Caucasian female with bilateral nephrolithiasis and bilateral renal cysts who presents today to discuss renal ultrasound report.    Background history Patient is referred to [place] by, Dr. Ola Spurr, for dysuria. Patient states that she has had dysuria for over a week, but it worsened over the weekend.  She is having associated dysuria and nocturia x 2.  She does/does not have a history of urinary tract infections, STI's or injury to the bladder.  She is experiencing left flank pain that is radiating into her left side.  She denies, gross hematuria, suprapubic pain, back pain or abdominal pain.  She has not had any recent fevers, chills, nausea or vomiting. She does have a history of nephrolithiasis.  Her stone composition is unknown at this time.  CT Renal stone study performed on 08/26/2015 noted a bilateral nonobstructive nephrolithiasis as above. No CT evidence for obstructive uropathy.  Scattered cysts within the bilateral kidneys, with 2 small hyperdense lesions within the left kidney as above. These are not significantly changed relative to prior CT from 08/01/2013, and likely reflect small proteinaceous or hemorrhagic cysts given their relative stability.  No other acute intra-abdominal or pelvic process identified.  Status post cholecystectomy.    CT Renal study performed on 10/21/2016 noted bilateral nephrolithiasis, without obstructive uropathy.  Possible constipation.  Multiple renal lesions which are likely cysts and complex cysts, although these are incompletely characterized on this noncontrast.   Exam, relative stability favors benign or indolent lesions.  Hepatic steatosis.  Aortic atherosclerosis  I  have independently reviewed the films.    RUS on 05/30/2018 revealed multiple BILATERAL renal cysts as noted on prior imaging.  Non-obstructing 7 mm calculus in an UPPER pole calyx of the RIGHT kidney. Previously identified LEFT renal calculi are not visible on today's ultrasound.  Layering debris in the urinary bladder, possibly indicating infection. Please correlate with urinalysis.  She is still having leakage.  It is urge incontinence.  She wears pads when going out, but she manages it while at home.  Patient denies any gross hematuria, dysuria or suprapubic/flank pain.  Patient denies any fevers, chills, nausea or vomiting.   UA 0-5 WBC's.    PMH: Past Medical History:  Diagnosis Date  . Arthritis   . Asthma   . Bell palsy 1973  . Fibromyalgia   . GERD (gastroesophageal reflux disease)   . Head ache   . Hiatal hernia   . Hypercalcemia   . Hypertension   . Kidney stones   . Optic nerve disease     Surgical History: Past Surgical History:  Procedure Laterality Date  . ABDOMINAL HYSTERECTOMY  1982  . BREAST BIOPSY Right 03/04/2016   US biopsy/ INTRADUCTAL PAPILLOMA  . BREAST BIOPSY Left 03/04/2016   Fibroadenoma  . CARPAL TUNNEL RELEASE Bilateral 2012  . CATARACT EXTRACTION Bilateral 2017  . CHOLECYSTECTOMY  2001  . KNEE SURGERY Left     Home Medications:  Allergies as of 06/15/2018      Reactions   Iodine Itching   Penicillins    Sulfa Antibiotics    Tape    Tetracyclines & Related    Zocor [simvastatin]    Zovirax [acyclovir]  Medication List        Accurate as of 06/15/18 11:59 PM. Always use your most recent med list.          alendronate-cholecalciferol 70-2800 MG-UNIT tablet Commonly known as:  FOSAMAX PLUS D Take 1 tablet by mouth every 7 (seven) days. Take with a full glass of water on an empty stomach.   brimonidine 0.2 % ophthalmic solution Commonly known as:  ALPHAGAN   busPIRone 10 MG tablet Commonly known as:  BUSPAR Take 10 mg by  mouth 2 (two) times daily.   cetirizine 10 MG tablet Commonly known as:  ZYRTEC Take 10 mg by mouth daily.   cholecalciferol 400 UNIT/ML Liqd Commonly known as:  D-VI-SOL Take 400 Units by mouth daily.   cyanocobalamin 1000 MCG tablet Take 100 mcg by mouth daily.   diclofenac 1.3 % Ptch Commonly known as:  FLECTOR Place 1 patch onto the skin 2 (two) times daily.   dorzolamide 2 % ophthalmic solution Commonly known as:  TRUSOPT   DULoxetine 30 MG capsule Commonly known as:  CYMBALTA Take by mouth.   enalapril 10 MG tablet Commonly known as:  VASOTEC   fluticasone 50 MCG/ACT nasal spray Commonly known as:  FLONASE Place 2 sprays into both nostrils daily.   hydrochlorothiazide 25 MG tablet Commonly known as:  HYDRODIURIL Take 25 mg by mouth daily.   hyoscyamine 0.125 MG tablet Commonly known as:  LEVSIN, ANASPAZ Take by mouth.   latanoprost 0.005 % ophthalmic solution Commonly known as:  XALATAN Place 1 drop into both eyes at bedtime.   magnesium oxide 400 (241.3 Mg) MG tablet Commonly known as:  MAG-OX   magnesium oxide 400 MG tablet Commonly known as:  MAG-OX Take 400 mg by mouth daily.   metoprolol succinate 25 MG 24 hr tablet Commonly known as:  TOPROL-XL Take 25 mg by mouth daily.   nortriptyline 10 MG capsule Commonly known as:  PAMELOR   omeprazole 20 MG capsule Commonly known as:  PRILOSEC Take 20 mg by mouth 2 (two) times daily before a meal.   tiZANidine 2 MG tablet Commonly known as:  ZANAFLEX TAKE 1 TABLET BY MOUTH AT BEDTIME AS NEEDED.       Allergies:  Allergies  Allergen Reactions  . Iodine Itching  . Penicillins   . Sulfa Antibiotics   . Tape   . Tetracyclines & Related   . Zocor [Simvastatin]   . Zovirax [Acyclovir]     Family History: Family History  Problem Relation Age of Onset  . Leukemia Mother   . Diabetes Father   . Breast cancer Sister   . Brain cancer Brother   . Diabetes Sister   . Diabetes Brother   .  Prostate cancer Neg Hx   . Kidney cancer Neg Hx   . Bladder Cancer Neg Hx     Social History:  reports that she has never smoked. She has never used smokeless tobacco. She reports that she does not drink alcohol or use drugs.  ROS: UROLOGY Frequent Urination?: No Hard to postpone urination?: No Burning/pain with urination?: No Get up at night to urinate?: No Leakage of urine?: Yes Urine stream starts and stops?: No Trouble starting stream?: No Do you have to strain to urinate?: No Blood in urine?: No Urinary tract infection?: No Sexually transmitted disease?: No Injury to kidneys or bladder?: No Painful intercourse?: No Weak stream?: No Currently pregnant?: No Vaginal bleeding?: No Last menstrual period?: n  Gastrointestinal Nausea?: No Vomiting?:  No Indigestion/heartburn?: No Diarrhea?: No Constipation?: No  Constitutional Fever: No Night sweats?: No Weight loss?: No Fatigue?: No  Skin Skin rash/lesions?: No Itching?: No  Eyes Blurred vision?: No Double vision?: No  Ears/Nose/Throat Sore throat?: No Sinus problems?: No  Hematologic/Lymphatic Swollen glands?: No Easy bruising?: No  Cardiovascular Leg swelling?: No Chest pain?: No  Respiratory Cough?: No Shortness of breath?: No  Endocrine Excessive thirst?: No  Musculoskeletal Back pain?: No Joint pain?: No  Neurological Headaches?: No Dizziness?: No  Psychologic Depression?: No Anxiety?: No  Physical Exam: BP 132/67   Pulse 80   Ht 4\' 11"  (1.499 m)   Wt 163 lb (73.9 kg)   BMI 32.92 kg/m   Constitutional: Well nourished. Alert and oriented, No acute distress. HEENT: [place] AT, moist mucus membranes. Trachea midline, no masses. Cardiovascular: No clubbing, cyanosis, or edema. Respiratory: Normal respiratory effort, no increased work of breathing. Skin: No rashes, bruises or suspicious lesions. Lymph: No cervical or inguinal adenopathy. Neurologic: Grossly intact, no focal  deficits, moving all 4 extremities. Psychiatric: Normal mood and affect.   Laboratory Data: Lab Results  Component Value Date   WBC 7.9 08/26/2015   HGB 13.1 08/26/2015   HCT 38.0 08/26/2015   MCV 88.3 08/26/2015   PLT 249 08/26/2015    Lab Results  Component Value Date   CREATININE 1.10 (H) 06/15/2018    Lab Results  Component Value Date   AST 21 08/26/2015   Lab Results  Component Value Date   ALT 14 08/26/2015    Pertinent Imaging: CLINICAL DATA:  Current history of BILATERAL nephrolithiasis. Known BILATERAL renal cysts.  EXAM: RENAL / URINARY TRACT ULTRASOUND COMPLETE  COMPARISON:  Lumbar MRI 02/15/2018. CT abdomen and pelvis 10/21/2016, 08/26/2015 and earlier. Urinary tract ultrasound 07/25/2014.  FINDINGS: Right Kidney:  Length: Approximately 9.6 cm. No hydronephrosis. 7 mm shadowing calculus in an UPPER pole calyx. Adjacent anechoic masses involving the mid kidney measuring approximately 1.8 cm and 1.1 cm respectively. Anechoic mass arising from the LOWER pole measuring approximately 0.9 cm. No solid masses.  Left Kidney:  Length: Approximately 9.9 cm. No hydronephrosis. No visible shadowing calculi. Multiple anechoic masses, the largest arising from the mid kidney measuring approximately 4.1 cm and from the UPPER pole measuring approximately 2.8 cm.  Bladder:  Relatively decompressed. BILATERAL ureteral jets visible at Doppler evaluation. Layering debris within the bladder.  IMPRESSION: 1. Multiple BILATERAL renal cysts as noted on prior imaging. 2. Non-obstructing 7 mm calculus in an UPPER pole calyx of the RIGHT kidney. Previously identified LEFT renal calculi are not visible on today's ultrasound. 3. Layering debris in the urinary bladder, possibly indicating infection. Please correlate with urinalysis.   Electronically Signed   By: Evangeline Dakin M.D.   On: 05/30/2018 16:37 I have independently reviewed the  films  Assessment & Plan:    1. Bilateral renal cysts Bilateral renal cysts - benign  2. Bilateral nephrolithiasis No hydro on RUS - only stone in the left kidney was visible on recent ultrasound No intervention warranted at this time  Patient is advised that if they should start to experience pain that is not able to be controlled with pain medication, intractable nausea and/or vomiting and/or fevers greater than 103 or shaking chills to contact the office immediately or seek treatment in the emergency department for emergent intervention.    3. Bladder debris UA was negative - sent for culture Follow up pending culture results  4. Incontinence Patient is not interested in pursuing treatment  at this time  5. Vaginal atrophy She has an area in her breast that they are monitoring - will hold on prescribing vaginal estrogen cream    Return for pending UA and culture .  These notes generated with voice recognition software. I apologize for typographical errors.  Zara Council, PA-C  [facility] [address] [place], [place] [address] [phone]

## 2018-06-18 ENCOUNTER — Telehealth: Payer: Self-pay

## 2018-06-18 NOTE — Telephone Encounter (Signed)
-----   Message from Nori Riis, PA-C sent at 06/15/2018  3:26 PM EDT ----- Would you send Mrs. Mindel's urine for culture?

## 2018-06-18 NOTE — Telephone Encounter (Signed)
Left pt mess to call , to drop off UA for culture

## 2018-06-19 NOTE — Telephone Encounter (Signed)
Pt stated she left a urine and had blood work done in [facility] last Friday.  Does she still need to come in for urine.  [phone]

## 2018-06-20 ENCOUNTER — Telehealth: Payer: Self-pay | Admitting: Urology

## 2018-06-20 DIAGNOSIS — N200 Calculus of kidney: Secondary | ICD-10-CM

## 2018-06-20 NOTE — Addendum Note (Signed)
Addended by: Tommy Rainwater on: 06/20/2018 04:03 PM   Modules accepted: Orders

## 2018-06-20 NOTE — Telephone Encounter (Signed)
Pt will go by [place] location one day next week and leave urine sample.  Can you put order in please?

## 2018-06-20 NOTE — Telephone Encounter (Signed)
Order placed

## 2018-07-05 ENCOUNTER — Telehealth: Payer: Self-pay | Admitting: Urology

## 2018-07-05 NOTE — Telephone Encounter (Signed)
Pt is going to [place] location tomorrow to leave more urine.  Can you put order in please?

## 2018-07-06 ENCOUNTER — Other Ambulatory Visit
Admission: RE | Admit: 2018-07-06 | Discharge: 2018-07-06 | Disposition: A | Discharge status: Home / Self Care | Payer: Medicare Other | Source: Ambulatory Visit | Attending: Urology | Admitting: Urology

## 2018-07-06 ENCOUNTER — Other Ambulatory Visit: Payer: Self-pay | Admitting: Urology

## 2018-07-06 ENCOUNTER — Other Ambulatory Visit: Payer: Self-pay | Admitting: Family Medicine

## 2018-07-06 DIAGNOSIS — N200 Calculus of kidney: Secondary | ICD-10-CM | POA: Insufficient documentation

## 2018-07-06 LAB — URINALYSIS, COMPLETE (UACMP) WITH MICROSCOPIC
BACTERIA UA: NONE SEEN
Bilirubin Urine: NEGATIVE
Glucose, UA: NEGATIVE mg/dL
Hgb urine dipstick: NEGATIVE
Ketones, ur: NEGATIVE mg/dL
Nitrite: NEGATIVE
Specific Gravity, Urine: 1.015 (ref 1.005–1.030)
pH: 6 (ref 5.0–8.0)

## 2018-07-06 NOTE — Progress Notes (Signed)
Would you put in orders for UA and urine culture for Anne Steele?

## 2018-07-06 NOTE — Telephone Encounter (Signed)
This needs to go to clinical.

## 2018-07-07 LAB — URINE CULTURE: Culture: NO GROWTH

## 2018-07-09 ENCOUNTER — Telehealth: Payer: Self-pay | Admitting: Family Medicine

## 2018-07-09 NOTE — Telephone Encounter (Signed)
LMOM for patient to return call.

## 2018-07-09 NOTE — Telephone Encounter (Signed)
-----   Message from Nori Riis, PA-C sent at 07/09/2018  7:52 AM EDT ----- Please let Mrs. Anne Steele know that her urine culture was negative.  We should schedule a cystoscopy at this time to look inside her bladder.

## 2018-07-10 NOTE — Telephone Encounter (Signed)
Patient notified and appointment scheduled. 

## 2018-07-18 ENCOUNTER — Telehealth: Payer: Self-pay | Admitting: Urology

## 2018-07-18 NOTE — Telephone Encounter (Signed)
Pt returned call and I read note from [place].  I informed pt of her appt for cysto with Erlene Quan.

## 2018-07-18 NOTE — Telephone Encounter (Signed)
Pt called lmom asking for her "kidney Results" pt would like someone to call her before 1:30 today if at all possible. If she can't be reached on her home phone please call her cell at [phone]. Please advise. Thanks.

## 2018-08-09 ENCOUNTER — Other Ambulatory Visit: Payer: Self-pay

## 2018-08-09 DIAGNOSIS — R300 Dysuria: Secondary | ICD-10-CM

## 2018-08-10 ENCOUNTER — Ambulatory Visit (INDEPENDENT_AMBULATORY_CARE_PROVIDER_SITE_OTHER): Payer: Medicare Other | Admitting: Urology

## 2018-08-10 ENCOUNTER — Other Ambulatory Visit
Admission: RE | Admit: 2018-08-10 | Discharge: 2018-08-10 | Disposition: A | Discharge status: Home / Self Care | Payer: Medicare Other | Source: Ambulatory Visit | Attending: Urology | Admitting: Urology

## 2018-08-10 ENCOUNTER — Encounter: Payer: Self-pay | Admitting: Urology

## 2018-08-10 VITALS — BP 139/74 | HR 94 | Ht 59.0 in | Wt 163.0 lb

## 2018-08-10 DIAGNOSIS — N390 Urinary tract infection, site not specified: Secondary | ICD-10-CM

## 2018-08-10 DIAGNOSIS — R300 Dysuria: Secondary | ICD-10-CM

## 2018-08-10 DIAGNOSIS — N329 Bladder disorder, unspecified: Secondary | ICD-10-CM

## 2018-08-10 DIAGNOSIS — N9089 Other specified noninflammatory disorders of vulva and perineum: Secondary | ICD-10-CM | POA: Insufficient documentation

## 2018-08-10 LAB — URINALYSIS, COMPLETE (UACMP) WITH MICROSCOPIC
Bilirubin Urine: NEGATIVE
Glucose, UA: NEGATIVE mg/dL
HGB URINE DIPSTICK: NEGATIVE
Ketones, ur: NEGATIVE mg/dL
Nitrite: NEGATIVE
Protein, ur: NEGATIVE mg/dL
Specific Gravity, Urine: 1.015 (ref 1.005–1.030)
pH: 5.5 (ref 5.0–8.0)

## 2018-08-10 MED ORDER — LIDOCAINE HCL URETHRAL/MUCOSAL 2 % EX GEL
1.0000 "application " | Freq: Once | CUTANEOUS | Status: AC
  Administered 2018-08-10: 1 via URETHRAL

## 2018-08-10 NOTE — Patient Instructions (Signed)
Desitin or A&D ointment

## 2018-08-10 NOTE — H&P (View-Only) (Signed)
   08/10/18  CC:  Chief Complaint  Patient presents with  . Cysto    HPI: 77 year old female with recurrent urinary tract infections and ongoing urinary symptoms who presents today for cystoscopy for further evaluation of this.  Today complaining of vaginal dryness and external irritation of her labia.  She has been using estrogen cream both internally and externally.  She is asking if there is anything else for irritation externally.  Also complains of nonspecific left lower abdominal pain is been going on for quite some time.  She notes that she has multiple renal cysts and stones bilaterally.  No acute pain.  Associated symptoms.  Blood pressure 139/74, pulse 94, height 4\' 11"  (1.499 m), weight 163 lb (73.9 kg). NED. A&Ox3.   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  Lidocaine jelly was administered per urethral meatus.    Procedure: - Flexible cystoscope introduced, without any difficulty.   - Thorough search of the bladder revealed:    normal urethral meatus    normal urothelium with an approximately 0.5 cm spherical submucosal nodule at the dome    no stones    no ulcers     no tumors    no urethral polyps    no trabeculation  - Ureteral orifices were normal in position and appearance.  Mild trigonitis appreciated.  Post-Procedure: - Patient tolerated the procedure well  Assessment/ Plan:  1. Recurrent UTI No evidence of significant bladder pathology contributing to recurrent infection/dysuria other than as per as below - lidocaine (XYLOCAINE) 2 % jelly 1 application  2. Lesion of bladder Submucosal lesion at the bladder dome I recommended pursuing biopsy of this in the operating room with consideration of  bilateral retrograde pyelogram at the time to evaluate her upper tracts We discussed the risk of this in detail including risk of bleeding, infection, damage surrounding structures amongst others.  All questions answered today. Preop urine culture obtained today  3. Labial irritation External labial irritation I recommended a barrier cream   Hollice Espy, MD

## 2018-08-10 NOTE — Progress Notes (Signed)
   08/10/18  CC:  Chief Complaint  Patient presents with  . Cysto    HPI: 77 year old female with recurrent urinary tract infections and ongoing urinary symptoms who presents today for cystoscopy for further evaluation of this.  Today complaining of vaginal dryness and external irritation of her labia.  She has been using estrogen cream both internally and externally.  She is asking if there is anything else for irritation externally.  Also complains of nonspecific left lower abdominal pain is been going on for quite some time.  She notes that she has multiple renal cysts and stones bilaterally.  No acute pain.  Associated symptoms.  Blood pressure 139/74, pulse 94, height 4\' 11"  (1.499 m), weight 163 lb (73.9 kg). NED. A&Ox3.   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  Lidocaine jelly was administered per urethral meatus.    Procedure: - Flexible cystoscope introduced, without any difficulty.   - Thorough search of the bladder revealed:    normal urethral meatus    normal urothelium with an approximately 0.5 cm spherical submucosal nodule at the dome    no stones    no ulcers     no tumors    no urethral polyps    no trabeculation  - Ureteral orifices were normal in position and appearance.  Mild trigonitis appreciated.  Post-Procedure: - Patient tolerated the procedure well  Assessment/ Plan:  1. Recurrent UTI No evidence of significant bladder pathology contributing to recurrent infection/dysuria other than as per as below - lidocaine (XYLOCAINE) 2 % jelly 1 application  2. Lesion of bladder Submucosal lesion at the bladder dome I recommended pursuing biopsy of this in the operating room with consideration of  bilateral retrograde pyelogram at the time to evaluate her upper tracts We discussed the risk of this in detail including risk of bleeding, infection, damage surrounding structures amongst others.  All questions answered today. Preop urine culture obtained today  3. Labial irritation External labial irritation I recommended a barrier cream   Hollice Espy, MD

## 2018-08-12 LAB — URINE CULTURE: Culture: <10000 — AB

## 2018-08-19 HISTORY — PX: BLADDER TUMOR EXCISION: SHX238

## 2018-08-20 ENCOUNTER — Telehealth: Payer: Self-pay | Admitting: Radiology

## 2018-08-20 NOTE — Telephone Encounter (Signed)
LMOM to return call.

## 2018-08-20 NOTE — Telephone Encounter (Signed)
-----   Message from Hollice Espy, MD sent at 08/10/2018 11:54 AM EST ----- These book this patient for cystoscopy, bladder biopsy, bilateral retrograde pyelogram in the operating room.  Preop urine culture was sent today.  Cipro 400 IV for antibiotics.  Please feel a booking sheet as best as possible and I will sign on Monday/Tuesday.

## 2018-08-21 ENCOUNTER — Other Ambulatory Visit: Payer: Self-pay | Admitting: Radiology

## 2018-08-21 DIAGNOSIS — N329 Bladder disorder, unspecified: Secondary | ICD-10-CM

## 2018-08-21 NOTE — Telephone Encounter (Signed)
Discussed the [facility] Information form below with patient over the phone.  [place], [place], [place] [address] Telephone: [phone] Fax: [phone]   Thank you for choosing [facility] for your upcoming surgery!  We are always here to assist in your urological needs.  Please read the following information with specific details for your upcoming appointments related to your surgery. Please contact Maryellen Dowdle at [phone] Option 3 with any questions.  The Name of Your Surgery: Cystoscopy, bladder biopsy, bilateral retrograde pyelograms Your Surgery Date: 08/29/2018 Your Surgeon: Hollice Espy  Please call Same Day Surgery at [phone] between the hours of 1pm-3pm one day prior to your surgery. They will inform you of the time to arrive at Same Day Surgery which is located on the second floor of the [facility].   You will receive a call from the [place] office regarding your appointment with them.  The Pre-Admission Testing office is located at [place], on the first floor of the [facility] in [place] (office is to the right as you enter through the [employer] of the [facility]). Please have all medications you are currently taking and your insurance card available.   Patient was advised to have nothing to eat or drink after midnight the night prior to surgery except that she may have only water until 2 hours before surgery with nothing to drink within 2 hours of surgery.  The patient states she currently takes no blood thinners. Patient's questions were answered and she expressed understanding of these instructions.

## 2018-08-23 ENCOUNTER — Other Ambulatory Visit: Payer: Self-pay

## 2018-08-23 ENCOUNTER — Encounter
Admission: RE | Admit: 2018-08-23 | Discharge: 2018-08-23 | Disposition: A | Discharge status: Home / Self Care | Payer: Medicare Other | Source: Ambulatory Visit | Attending: Urology | Admitting: Urology

## 2018-08-23 DIAGNOSIS — F329 Major depressive disorder, single episode, unspecified: Secondary | ICD-10-CM | POA: Diagnosis not present

## 2018-08-23 DIAGNOSIS — Z79899 Other long term (current) drug therapy: Secondary | ICD-10-CM | POA: Diagnosis not present

## 2018-08-23 DIAGNOSIS — Z87440 Personal history of urinary (tract) infections: Secondary | ICD-10-CM | POA: Diagnosis not present

## 2018-08-23 DIAGNOSIS — M797 Fibromyalgia: Secondary | ICD-10-CM | POA: Diagnosis not present

## 2018-08-23 DIAGNOSIS — K219 Gastro-esophageal reflux disease without esophagitis: Secondary | ICD-10-CM | POA: Diagnosis not present

## 2018-08-23 DIAGNOSIS — I10 Essential (primary) hypertension: Secondary | ICD-10-CM | POA: Diagnosis not present

## 2018-08-23 DIAGNOSIS — N329 Bladder disorder, unspecified: Secondary | ICD-10-CM | POA: Diagnosis present

## 2018-08-23 DIAGNOSIS — Z01812 Encounter for preprocedural laboratory examination: Secondary | ICD-10-CM | POA: Diagnosis not present

## 2018-08-23 DIAGNOSIS — Z7983 Long term (current) use of bisphosphonates: Secondary | ICD-10-CM | POA: Diagnosis not present

## 2018-08-23 DIAGNOSIS — J45909 Unspecified asthma, uncomplicated: Secondary | ICD-10-CM | POA: Diagnosis not present

## 2018-08-23 HISTORY — DX: Major depressive disorder, single episode, unspecified: F32.9

## 2018-08-23 HISTORY — DX: Personal history of urinary calculi: Z87.442

## 2018-08-23 HISTORY — DX: Bell's palsy: G51.0

## 2018-08-23 HISTORY — DX: Depression, unspecified: F32.A

## 2018-08-23 LAB — BASIC METABOLIC PANEL
ANION GAP: 9 (ref 5–15)
BUN: 22 mg/dL (ref 8–23)
CO2: 29 mmol/L (ref 22–32)
Calcium: 10.7 mg/dL — ABNORMAL HIGH (ref 8.9–10.3)
Chloride: 104 mmol/L (ref 98–111)
Creatinine, Ser: 0.94 mg/dL (ref 0.44–1.00)
GFR, EST NON AFRICAN AMERICAN: 59 mL/min — AB (ref >60)
Glucose, Bld: 99 mg/dL (ref 70–99)
POTASSIUM: 4.4 mmol/L (ref 3.5–5.1)
Sodium: 142 mmol/L (ref 135–145)

## 2018-08-23 LAB — CBC
HCT: 39.8 % (ref 36.0–46.0)
HEMOGLOBIN: 13 g/dL (ref 12.0–15.0)
MCH: 30.3 pg (ref 26.0–34.0)
MCHC: 32.7 g/dL (ref 30.0–36.0)
MCV: 92.8 fL (ref 80.0–100.0)
NRBC: 0 % (ref 0.0–0.2)
PLATELETS: 289 10*3/uL (ref 150–400)
RBC: 4.29 MIL/uL (ref 3.87–5.11)
RDW: 12.4 % (ref 11.5–15.5)
WBC: 9.3 10*3/uL (ref 4.0–10.5)

## 2018-08-23 NOTE — Patient Instructions (Addendum)
Your procedure is scheduled on: Wednesday 08/29/18.   Report to DAY SURGERY DEPARTMENT LOCATED ON 2ND FLOOR MEDICAL MALL ENTRANCE. To find out your arrival time please call [phone] between 1PM - 3PM on Tuedsday 08/28/18.    Remember: Instructions that are not followed completely may result in serious medical risk, up to and including death, or upon the discretion of your surgeon and anesthesiologist your surgery may need to be rescheduled.       _X__ 1. Do not eat food after midnight the night before your procedure.                 No gum chewing or hard candies. You may drink clear liquids up to 2 hours                 before you are scheduled to arrive for your surgery- DO NOT drink clear                 liquids within 2 hours of the start of your surgery.                 Clear Liquids include:  water, apple juice without pulp, clear carbohydrate                 drink such as Clearfast or Gatorade, Black Coffee or Tea (Do not add                 anything to coffee or tea).    __X__2.  On the morning of surgery brush your teeth with toothpaste and water, you may rinse your mouth with mouthwash if you wish.  Do not swallow any toothpaste or mouthwash.      __X__  6.  Notify your doctor if there is any change in your medical condition      (cold, fever, infections).       Do not wear jewelry, make-up, hairpins, clips or nail polish. Do not wear lotions, powders, or perfumes.  Do not shave 48 hours prior to surgery. Men may shave face and neck. Do not bring valuables to the hospital.      [facility] is not responsible for any belongings or valuables.   Patients discharged the day of surgery will not be allowed to drive home.      Please read over the following fact sheets that you were given:   MRSA Information    __X__ Take these medicines the morning of surgery with A SIP OF WATER:     1. busPIRone (BUSPAR) 10 MG tablet  2. cetirizine (ZYRTEC) 10 MG  tablet  3. fluticasone (FLONASE) 50 MCG/ACT nasal spray  4. metoprolol succinate (TOPROL-XL) 25 MG 24 hr tablet  5. omeprazole (PRILOSEC) 20 MG capsule  6. nortriptyline (PAMELOR) 10 MG capsule      __X__ Stop Anti-inflammatories 7 days before surgery such as Advil, Ibuprofen, Motrin, BC or Goodies Powder, Naprosyn, Naproxen, Aleve, Aspirin, Meloxicam. May take Tylenol if needed for pain or discomfort.     __X__ Stop all herbal supplements today.

## 2018-08-28 MED ORDER — FAMOTIDINE 20 MG PO TABS
20.0000 mg | ORAL_TABLET | Freq: Once | ORAL | Status: AC
  Administered 2018-08-29: 20 mg via ORAL

## 2018-08-28 MED ORDER — CIPROFLOXACIN IN D5W 400 MG/200ML IV SOLN
400.0000 mg | INTRAVENOUS | Status: AC
  Administered 2018-08-29: 400 mg via INTRAVENOUS

## 2018-08-28 MED ORDER — LACTATED RINGERS IV SOLN
INTRAVENOUS | Status: DC
  Administered 2018-08-29: 13:00:00 via INTRAVENOUS

## 2018-08-29 ENCOUNTER — Ambulatory Visit: Payer: Medicare Other | Admitting: Certified Registered Nurse Anesthetist

## 2018-08-29 ENCOUNTER — Other Ambulatory Visit: Payer: Self-pay

## 2018-08-29 ENCOUNTER — Ambulatory Visit
Admission: RE | Admit: 2018-08-29 | Discharge: 2018-08-29 | Disposition: A | Discharge status: Home / Self Care | Payer: Medicare Other | Attending: Urology | Admitting: Urology

## 2018-08-29 ENCOUNTER — Encounter
Admission: RE | Disposition: A | Discharge status: Home / Self Care | Payer: Self-pay | Source: Home / Self Care | Attending: Urology

## 2018-08-29 ENCOUNTER — Encounter: Payer: Self-pay | Admitting: Certified Registered Nurse Anesthetist

## 2018-08-29 DIAGNOSIS — J45909 Unspecified asthma, uncomplicated: Secondary | ICD-10-CM | POA: Insufficient documentation

## 2018-08-29 DIAGNOSIS — Z87440 Personal history of urinary (tract) infections: Secondary | ICD-10-CM | POA: Insufficient documentation

## 2018-08-29 DIAGNOSIS — K219 Gastro-esophageal reflux disease without esophagitis: Secondary | ICD-10-CM | POA: Insufficient documentation

## 2018-08-29 DIAGNOSIS — I10 Essential (primary) hypertension: Secondary | ICD-10-CM | POA: Insufficient documentation

## 2018-08-29 DIAGNOSIS — F329 Major depressive disorder, single episode, unspecified: Secondary | ICD-10-CM | POA: Insufficient documentation

## 2018-08-29 DIAGNOSIS — N329 Bladder disorder, unspecified: Secondary | ICD-10-CM | POA: Insufficient documentation

## 2018-08-29 DIAGNOSIS — N3289 Other specified disorders of bladder: Secondary | ICD-10-CM | POA: Diagnosis not present

## 2018-08-29 DIAGNOSIS — Z7983 Long term (current) use of bisphosphonates: Secondary | ICD-10-CM | POA: Insufficient documentation

## 2018-08-29 DIAGNOSIS — M797 Fibromyalgia: Secondary | ICD-10-CM | POA: Insufficient documentation

## 2018-08-29 DIAGNOSIS — Z79899 Other long term (current) drug therapy: Secondary | ICD-10-CM | POA: Insufficient documentation

## 2018-08-29 HISTORY — PX: CYSTOSCOPY WITH BIOPSY: SHX5122

## 2018-08-29 HISTORY — PX: CYSTOSCOPY W/ RETROGRADES: SHX1426

## 2018-08-29 SURGERY — CYSTOSCOPY, WITH BIOPSY
Anesthesia: General

## 2018-08-29 MED ORDER — PROPOFOL 10 MG/ML IV BOLUS
INTRAVENOUS | Status: DC | PRN
  Administered 2018-08-29: 140 mg via INTRAVENOUS

## 2018-08-29 MED ORDER — FENTANYL CITRATE (PF) 100 MCG/2ML IJ SOLN
25.0000 ug | INTRAMUSCULAR | Status: DC | PRN
  Administered 2018-08-29 (×2): 25 ug via INTRAVENOUS

## 2018-08-29 MED ORDER — FENTANYL CITRATE (PF) 100 MCG/2ML IJ SOLN
INTRAMUSCULAR | Status: AC
  Filled 2018-08-29: qty 2

## 2018-08-29 MED ORDER — ONDANSETRON HCL 4 MG/2ML IJ SOLN
INTRAMUSCULAR | Status: DC | PRN
  Administered 2018-08-29: 4 mg via INTRAVENOUS

## 2018-08-29 MED ORDER — HYDROCODONE-ACETAMINOPHEN 5-325 MG PO TABS: 1.0000 | ORAL_TABLET | Freq: Four times a day (QID) | ORAL | 0 refills | Status: DC | PRN

## 2018-08-29 MED ORDER — IOPAMIDOL (ISOVUE-200) INJECTION 41%
INTRAVENOUS | Status: DC | PRN
  Administered 2018-08-29: 30 mL

## 2018-08-29 MED ORDER — LIDOCAINE HCL (PF) 2 % IJ SOLN
INTRAMUSCULAR | Status: AC
  Filled 2018-08-29: qty 10

## 2018-08-29 MED ORDER — OXYCODONE HCL 5 MG PO TABS
5.0000 mg | ORAL_TABLET | Freq: Once | ORAL | Status: AC | PRN
  Administered 2018-08-29: 5 mg via ORAL

## 2018-08-29 MED ORDER — PROMETHAZINE HCL 25 MG/ML IJ SOLN
INTRAMUSCULAR | Status: AC
  Filled 2018-08-29: qty 1

## 2018-08-29 MED ORDER — SODIUM CHLORIDE FLUSH 0.9 % IV SOLN
INTRAVENOUS | Status: AC
  Filled 2018-08-29: qty 10

## 2018-08-29 MED ORDER — ONDANSETRON HCL 4 MG/2ML IJ SOLN
INTRAMUSCULAR | Status: AC
  Filled 2018-08-29: qty 2

## 2018-08-29 MED ORDER — PROPOFOL 10 MG/ML IV BOLUS
INTRAVENOUS | Status: AC
  Filled 2018-08-29: qty 20

## 2018-08-29 MED ORDER — OXYCODONE HCL 5 MG/5ML PO SOLN: 5.0000 mg | Freq: Once | ORAL | Status: AC | PRN

## 2018-08-29 MED ORDER — FAMOTIDINE 20 MG PO TABS
ORAL_TABLET | ORAL | Status: AC
  Administered 2018-08-29: 20 mg via ORAL
  Filled 2018-08-29: qty 1

## 2018-08-29 MED ORDER — DEXAMETHASONE SODIUM PHOSPHATE 10 MG/ML IJ SOLN
INTRAMUSCULAR | Status: DC | PRN
  Administered 2018-08-29: 6 mg via INTRAVENOUS

## 2018-08-29 MED ORDER — OXYBUTYNIN CHLORIDE 5 MG PO TABS: 5.0000 mg | ORAL_TABLET | Freq: Three times a day (TID) | ORAL | 0 refills | Status: DC | PRN

## 2018-08-29 MED ORDER — PROMETHAZINE HCL 25 MG/ML IJ SOLN
6.2500 mg | INTRAMUSCULAR | Status: DC | PRN
  Administered 2018-08-29: 6.25 mg via INTRAVENOUS

## 2018-08-29 MED ORDER — DEXAMETHASONE SODIUM PHOSPHATE 10 MG/ML IJ SOLN
INTRAMUSCULAR | Status: AC
  Filled 2018-08-29: qty 1

## 2018-08-29 MED ORDER — FENTANYL CITRATE (PF) 100 MCG/2ML IJ SOLN
INTRAMUSCULAR | Status: DC | PRN
  Administered 2018-08-29 (×2): 25 ug via INTRAVENOUS

## 2018-08-29 MED ORDER — MEPERIDINE HCL 50 MG/ML IJ SOLN: 6.2500 mg | INTRAMUSCULAR | Status: DC | PRN

## 2018-08-29 MED ORDER — LIDOCAINE HCL (CARDIAC) PF 100 MG/5ML IV SOSY
PREFILLED_SYRINGE | INTRAVENOUS | Status: DC | PRN
  Administered 2018-08-29: 80 mg via INTRAVENOUS

## 2018-08-29 MED ORDER — CIPROFLOXACIN IN D5W 400 MG/200ML IV SOLN
INTRAVENOUS | Status: AC
  Filled 2018-08-29: qty 200

## 2018-08-29 MED ORDER — OXYCODONE HCL 5 MG PO TABS
ORAL_TABLET | ORAL | Status: AC
  Filled 2018-08-29: qty 1

## 2018-08-29 SURGICAL SUPPLY — 24 items
BAG DRAIN CYSTO-URO LG1000N (MISCELLANEOUS) ×4 IMPLANT
BRUSH SCRUB EZ  4% CHG (MISCELLANEOUS) ×2
BRUSH SCRUB EZ 4% CHG (MISCELLANEOUS) ×2 IMPLANT
CATH URETL 5X70 OPEN END (CATHETERS) ×4 IMPLANT
DRAPE UTILITY 15X26 TOWEL STRL (DRAPES) ×4 IMPLANT
DRSG TELFA 4X3 1S NADH ST (GAUZE/BANDAGES/DRESSINGS) ×4 IMPLANT
ELECT REM PT RETURN 9FT ADLT (ELECTROSURGICAL) ×4
ELECTRODE REM PT RTRN 9FT ADLT (ELECTROSURGICAL) ×2 IMPLANT
GLOVE BIO SURGEON STRL SZ 6.5 (GLOVE) ×3 IMPLANT
GLOVE BIO SURGEONS STRL SZ 6.5 (GLOVE) ×1
GOWN STRL REUS W/ TWL LRG LVL3 (GOWN DISPOSABLE) ×4 IMPLANT
GOWN STRL REUS W/TWL LRG LVL3 (GOWN DISPOSABLE) ×4
KIT TURNOVER CYSTO (KITS) ×4 IMPLANT
NDL SAFETY ECLIPSE 18X1.5 (NEEDLE) IMPLANT
NEEDLE HYPO 18GX1.5 SHARP (NEEDLE)
PACK CYSTO AR (MISCELLANEOUS) ×4 IMPLANT
SENSORWIRE 0.038 NOT ANGLED (WIRE) ×4
SET CYSTO W/LG BORE CLAMP LF (SET/KITS/TRAYS/PACK) ×4 IMPLANT
SOL .9 NS 3000ML IRR  AL (IV SOLUTION) ×2
SOL .9 NS 3000ML IRR UROMATIC (IV SOLUTION) ×2 IMPLANT
SURGILUBE 2OZ TUBE FLIPTOP (MISCELLANEOUS) ×4 IMPLANT
WATER STERILE IRR 1000ML POUR (IV SOLUTION) ×4 IMPLANT
WATER STERILE IRR 3000ML UROMA (IV SOLUTION) ×4 IMPLANT
WIRE SENSOR 0.038 NOT ANGLED (WIRE) ×2 IMPLANT

## 2018-08-29 NOTE — Interval H&P Note (Signed)
History and Physical Interval Note:  08/29/2018 1:21 PM  Anne Steele  has presented today for surgery, with the diagnosis of bladder lesion  The various methods of treatment have been discussed with the patient and family. After consideration of risks, benefits and other options for treatment, the patient has consented to  Procedure(s): CYSTOSCOPY WITH Bladder BIOPSY (N/A) CYSTOSCOPY WITH RETROGRADE PYELOGRAM (Bilateral) as a surgical intervention .  The patient's history has been reviewed, patient examined, no change in status, stable for surgery.  I have reviewed the patient's chart and labs.  Questions were answered to the patient's satisfaction.    RRR CTAB  Hollice Espy

## 2018-08-29 NOTE — Transfer of Care (Signed)
Immediate Anesthesia Transfer of Care Note  Patient: Anne Steele  Procedure(s) Performed: CYSTOSCOPY WITH Bladder BIOPSY (N/A ) CYSTOSCOPY WITH RETROGRADE PYELOGRAM (Bilateral )  Patient Location: PACU  Anesthesia Type:General  Level of Consciousness: drowsy  Airway & Oxygen Therapy: Patient Spontanous Breathing and Patient connected to face mask oxygen  Post-op Assessment: Report given to RN and Post -op Vital signs reviewed and stable  Post vital signs: Reviewed and stable  Last Vitals:  Vitals Value Taken Time  BP 130/84 08/29/2018  2:33 PM  Temp    Pulse 87 08/29/2018  2:34 PM  Resp 19 08/29/2018  2:34 PM  SpO2 100 % 08/29/2018  2:34 PM  Vitals shown include unvalidated device data.  Last Pain:  Vitals:   08/29/18 1249  TempSrc: Temporal         Complications: No apparent anesthesia complications

## 2018-08-29 NOTE — Progress Notes (Signed)
Nauseated  Phenergan given

## 2018-08-29 NOTE — Anesthesia Preprocedure Evaluation (Signed)
Anesthesia Evaluation  Patient identified by MRN, date of birth, ID band Patient awake    Reviewed: Allergy & Precautions, NPO status , Patient's Chart, lab work & pertinent test results  History of Anesthesia Complications Negative for: history of anesthetic complications  Airway Mallampati: III  TM Distance: >3 FB Neck ROM: Full    Dental  (+) Upper Dentures   Pulmonary asthma , neg sleep apnea,    breath sounds clear to auscultation- rhonchi (-) wheezing      Cardiovascular hypertension, Pt. on medications (-) CAD, (-) Past MI, (-) Cardiac Stents and (-) CABG  Rhythm:Regular Rate:Normal - Systolic murmurs and - Diastolic murmurs    Neuro/Psych  Headaches, neg Seizures PSYCHIATRIC DISORDERS Depression    GI/Hepatic Neg liver ROS, hiatal hernia, GERD  ,  Endo/Other  negative endocrine ROSneg diabetes  Renal/GU Renal disease: nephrolithiasis.     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   Reproductive/Obstetrics                             Anesthesia Physical Anesthesia Plan  ASA: III  Anesthesia Plan: General   Post-op Pain Management:    Induction: Intravenous  PONV Risk Score and Plan: 2 and Ondansetron and Midazolam  Airway Management Planned: LMA  Additional Equipment:   Intra-op Plan:   Post-operative Plan:   Informed Consent: I have reviewed the patients History and Physical, chart, labs and  discussed the procedure including the risks, benefits and alternatives for the proposed anesthesia with the patient or authorized representative who has indicated his/her understanding and acceptance.   Dental advisory given  Plan Discussed with: CRNA and Anesthesiologist  Anesthesia Plan Comments:         Anesthesia Quick Evaluation

## 2018-08-29 NOTE — Anesthesia Post-op Follow-up Note (Signed)
Anesthesia QCDR form completed.        

## 2018-08-29 NOTE — Discharge Instructions (Signed)
AMBULATORY SURGERY  DISCHARGE INSTRUCTIONS   1) The drugs that you were given will stay in your system until tomorrow so for the next 24 hours you should not:  A) Drive an automobile B) Make any legal decisions C) Drink any alcoholic beverage   2) You may resume regular meals tomorrow.  Today it is better to start with liquids and gradually work up to solid foods.  You may eat anything you prefer, but it is better to start with liquids, then soup and crackers, and gradually work up to solid foods.   3) Please notify your doctor immediately if you have any unusual bleeding, trouble breathing, redness and pain at the surgery site, drainage, fever, or pain not relieved by medication.    4) Additional Instructions:        Please contact your physician with any problems or Same Day Surgery at [phone], Monday through Friday 6 am to 4 pm, or [facility] number at [phone].Transurethral Resection of Bladder Tumor (TURBT) or Bladder Biopsy   Definition:  Transurethral Resection of the Bladder Tumor is a surgical procedure used to diagnose and remove tumors within the bladder. TURBT is the most common treatment for early stage bladder cancer.  General instructions:     Your recent bladder surgery requires very little post hospital care but some definite precautions.  Despite the fact that no skin incisions were used, the area around the bladder incisions are raw and covered with scabs to promote healing and prevent bleeding. Certain precautions are needed to insure that the scabs are not disturbed over the next 2-4 weeks while the healing proceeds.  Because the raw surface inside your bladder and the irritating effects of urine you may expect frequency of urination and/or urgency (a stronger desire to urinate) and perhaps even getting up at night more often. This will usually resolve or improve slowly over the healing period. You may see some blood in your  urine over the first 6 weeks. Do not be alarmed, even if the urine was clear for a while. Get off your feet and drink lots of fluids until clearing occurs. If you start to pass clots or don't improve call us.  Diet:  You may return to your normal diet immediately. Because of the raw surface of your bladder, alcohol, spicy foods, foods high in acid and drinks with caffeine may cause irritation or frequency and should be used in moderation. To keep your urine flowing freely and avoid constipation, drink plenty of fluids during the day (8-10 glasses). Tip: Avoid cranberry juice because it is very acidic.  Activity:  Your physical activity doesn't need to be restricted. However, if you are very active, you may see some blood in the urine. We suggest that you reduce your activity under the circumstances until the bleeding has stopped.  Bowels:  It is important to keep your bowels regular during the postoperative period. Straining with bowel movements can cause bleeding. A bowel movement every other day is reasonable. Use a mild laxative if needed, such as milk of magnesia 2-3 tablespoons, or 2 Dulcolax tablets. Call if you continue to have problems. If you had been taking narcotics for pain, before, during or after your surgery, you may be constipated. Take a laxative if necessary.    Medication:  You should resume your pre-surgery medications unless told not to. In addition you may be given an antibiotic to prevent or treat infection. Antibiotics are not always necessary. All medication should   be taken as prescribed until the bottles are finished unless you are having an unusual reaction to one of the drugs.   [facility] [place], [place] [address] [phone]    

## 2018-08-29 NOTE — Op Note (Signed)
Date of procedure: 08/29/18  Preoperative diagnosis:  1. Submucosal bladder lesion, dome 2. Recurrent UTIs  Postoperative diagnosis:  1. Same as above  Procedure: 1. Bilateral retrograde pyelogram 2. Bladder biopsy  Surgeon: Hollice Espy, MD  Anesthesia: General  Complications: None  Intraoperative findings: Approximately 1 cm submucosal cystic structure biopsied, incompletely resected.  EBL: Minimal  Specimens: Bladder dome lesion, cystic wall of bladder dome lesion  Drains: None  Indication: Anne Steele is a 77 y.o. patient with recurrent UTIs who underwent cystoscopy found to have an unusual submucosal lesion on the dome of the bladder, approximately 1 cm.  She was counseled to undergo biopsy of this lesion..  After reviewing the management options for treatment, she elected to proceed with the above surgical procedure(s). We have discussed the potential benefits and risks of the procedure, side effects of the proposed treatment, the likelihood of the patient achieving the goals of the procedure, and any potential problems that might occur during the procedure or recuperation. Informed consent has been obtained.  Description of procedure:  The patient was taken to the operating room and general anesthesia was induced.  The patient was placed in the dorsal lithotomy position, prepped and draped in the usual sterile fashion, and preoperative antibiotics were administered. A preoperative time-out was performed.   21 [place] scope was advanced per urethra into the bladder.  The bladder itself was normal except for a 1 cm submucosal lesion at the dome of the bladder with mass-effect into the bladder.  There are no papillary lesions identified.  The trigone was normal.  Attention was first turned to the right ureteral orifice which was cannulated using 5 [place] open-ended ureteral catheter.  Gentle retrograde pyelogram was performed which revealed no hydroureteronephrosis or  filling defects.  The same exact procedure was performed on the left-hand side which was also unremarkable without hydroureteronephrosis or filling defects.  Next, cold cup biopsies were used to grasp the mucosa overlying the lesion at the dome of the bladder.  Once the mucosa was removed, a smooth walled what appeared to be cystic structure was uncovered.  The cold cup forceps were then used to sample the wall of the cystic structure unroofing it.  This was sent off as a separate specimen.  And then inspected the base of the lesion was 70 degree lens which was smooth-walled and not particularly suspicious for any malignancy.  As such, the decision was made not to resect the entire cystic structure due to risk for perforating the bladder and as such, the base of the lesion was fulgurated using Bugbee electrocautery.  Adequate hemostasis was achieved.  The bladder was then drained.  The patient was then clean and dry, repositioned in supine position, reversed from anesthesia, and taken to the PACU in stable condition.  Plan: Patient will follow-up next week as scheduled unless her pathology comes back benign.  If it does, she may follow-up as needed.  I will call her in this case.  Hollice Espy, M.D.

## 2018-08-29 NOTE — Anesthesia Procedure Notes (Signed)
Procedure Name: LMA Insertion Date/Time: 08/29/2018 1:55 PM Performed by: Eben Burow, CRNA Pre-anesthesia Checklist: Patient identified, Emergency Drugs available, Suction available and Patient being monitored Patient Re-evaluated:Patient Re-evaluated prior to induction Oxygen Delivery Method: Circle system utilized Preoxygenation: Pre-oxygenation with 100% oxygen Induction Type: IV induction LMA: LMA inserted LMA Size: 4.0 Number of attempts: 1 Placement Confirmation: positive ETCO2 and breath sounds checked- equal and bilateral Tube secured with: Tape Dental Injury: Teeth and Oropharynx as per pre-operative assessment

## 2018-08-30 NOTE — Anesthesia Postprocedure Evaluation (Signed)
Anesthesia Post Note  Patient: Anne Steele  Procedure(s) Performed: CYSTOSCOPY WITH Bladder BIOPSY (N/A ) CYSTOSCOPY WITH RETROGRADE PYELOGRAM (Bilateral )  Patient location during evaluation: PACU Anesthesia Type: General Level of consciousness: awake and alert and oriented Pain management: pain level controlled Vital Signs Assessment: post-procedure vital signs reviewed and stable Respiratory status: spontaneous breathing, nonlabored ventilation and respiratory function stable Cardiovascular status: blood pressure returned to baseline and stable Postop Assessment: no signs of nausea or vomiting Anesthetic complications: no     Last Vitals:  Vitals:   08/29/18 1537 08/29/18 1635  BP: (!) 160/68 (!) 141/62  Pulse: 80 82  Resp: 16 16  Temp: (!) 36.2 C (!) 36.4 C  SpO2: 99% 97%    Last Pain:  Vitals:   08/29/18 1635  TempSrc: Oral  PainSc: 4                  Brittany Osier

## 2018-08-31 ENCOUNTER — Telehealth: Payer: Self-pay

## 2018-08-31 LAB — SURGICAL PATHOLOGY

## 2018-08-31 NOTE — Telephone Encounter (Signed)
Informed patient of biopsy results and recommendations.

## 2018-08-31 NOTE — Telephone Encounter (Signed)
-----   Message from Hollice Espy, MD sent at 08/31/2018  3:32 PM EST ----- Please let this patient know that her biopsy was benign.  There is no evidence of cancer.  The lesion in her bladder was a benign cystic structure.  She can keep her follow-up appointment to discuss this further or may follow-up as needed.  Hollice Espy, MD

## 2018-09-07 ENCOUNTER — Encounter: Payer: Self-pay | Admitting: Urology

## 2018-09-07 ENCOUNTER — Ambulatory Visit (INDEPENDENT_AMBULATORY_CARE_PROVIDER_SITE_OTHER): Payer: Medicare Other | Admitting: Urology

## 2018-09-07 ENCOUNTER — Other Ambulatory Visit
Admission: RE | Admit: 2018-09-07 | Discharge: 2018-09-07 | Disposition: A | Discharge status: Home / Self Care | Payer: Medicare Other | Attending: Urology | Admitting: Urology

## 2018-09-07 VITALS — BP 118/67 | HR 90 | Ht 59.0 in | Wt 163.0 lb

## 2018-09-07 DIAGNOSIS — N390 Urinary tract infection, site not specified: Secondary | ICD-10-CM

## 2018-09-07 DIAGNOSIS — R300 Dysuria: Secondary | ICD-10-CM | POA: Insufficient documentation

## 2018-09-07 DIAGNOSIS — N200 Calculus of kidney: Secondary | ICD-10-CM

## 2018-09-07 DIAGNOSIS — N329 Bladder disorder, unspecified: Secondary | ICD-10-CM | POA: Diagnosis not present

## 2018-09-07 DIAGNOSIS — N281 Cyst of kidney, acquired: Secondary | ICD-10-CM

## 2018-09-07 LAB — URINALYSIS, COMPLETE (UACMP) WITH MICROSCOPIC
Bilirubin Urine: NEGATIVE
Glucose, UA: NEGATIVE mg/dL
KETONES UR: NEGATIVE mg/dL
Nitrite: NEGATIVE
Specific Gravity, Urine: 1.02 (ref 1.005–1.030)
Squamous Epithelial / HPF: NONE SEEN (ref 0–5)
WBC, UA: >50 WBC/hpf (ref 0–5)
pH: 5.5 (ref 5.0–8.0)

## 2018-09-07 NOTE — Addendum Note (Signed)
Addended by: Tommy Rainwater on: 09/07/2018 12:02 PM   Modules accepted: Orders

## 2018-09-07 NOTE — Progress Notes (Signed)
09/07/2018 10:39 AM   Anne Steele Anne Steele 1940-12-02 [id number]  Referring provider: Glendon Axe, MD [facility] [place], [place] [address]  Chief Complaint  Patient presents with  . Post-op Follow-up    HPI: 77 year old female who presents today for postop follow-up to discuss her pathology results.  She was taken to the operating room on 08/29/2018 found to have a submucosal lesion at the dome of the bladder.  This was biopsied and consistent with a benign cyst.  Postoperatively, she has been having some dysuria and left lower quadrant pain which is chronic.  This is unchanged from previous.  She is concerned she might have a UTI.  She does endorse chills.  No fevers.  She has a personal history of recurrent UTIs, benign renal cyst, nonobstructing kidney stones followed by [facility].   PMH: Past Medical History:  Diagnosis Date  . Arthritis   . Asthma   . Bell palsy 1973  . Bell's palsy   . Depression   . Fibromyalgia   . GERD (gastroesophageal reflux disease)   . Head ache   . Hiatal hernia   . History of kidney stones   . Hypercalcemia   . Hypertension   . Kidney stones   . Optic nerve disease     Surgical History: Past Surgical History:  Procedure Laterality Date  . ABDOMINAL HYSTERECTOMY  1982  . BREAST BIOPSY Right 03/04/2016   US biopsy/ INTRADUCTAL PAPILLOMA  . BREAST BIOPSY Left 03/04/2016   Fibroadenoma  . CARPAL TUNNEL RELEASE Bilateral 2012  . CATARACT EXTRACTION Bilateral 2017  . CHOLECYSTECTOMY  2001  . CYSTOSCOPY W/ RETROGRADES Bilateral 08/29/2018   Procedure: CYSTOSCOPY WITH RETROGRADE PYELOGRAM;  Surgeon: Hollice Espy, MD;  Location: [facility] ORS;  Service: Urology;  Laterality: Bilateral;  . CYSTOSCOPY WITH BIOPSY N/A 08/29/2018   Procedure: CYSTOSCOPY WITH Bladder BIOPSY;  Surgeon: Hollice Espy, MD;  Location: [facility] ORS;  Service: Urology;  Laterality: N/A;  . KNEE SURGERY Left     Home Medications:    Allergies as of 09/07/2018      Reactions   Iodine Itching   Ivp Dye [iodinated Diagnostic Agents]    Latanoprost    Penicillins    Sulfa Antibiotics    Tape    Tetracyclines & Related    Trusopt [dorzolamide Hcl]    Zocor [simvastatin]    Zovirax [acyclovir]       Medication List       Accurate as of September 07, 2018 10:39 AM. Always use your most recent med list.        acetaminophen 325 MG tablet Commonly known as:  TYLENOL Take 650 mg by mouth every 6 (six) hours as needed.   alendronate-cholecalciferol 70-2800 MG-UNIT tablet Commonly known as:  FOSAMAX PLUS D Take 1 tablet by mouth every 7 (seven) days. Take with a full glass of water on an empty stomach.   brimonidine 0.2 % ophthalmic solution Commonly known as:  ALPHAGAN   busPIRone 10 MG tablet Commonly known as:  BUSPAR Take 10 mg by mouth 2 (two) times daily.   cetirizine 10 MG tablet Commonly known as:  ZYRTEC Take 10 mg by mouth daily.   cholecalciferol 400 UNIT/ML Liqd Commonly known as:  D-VI-SOL Take 400 Units by mouth daily.   cyanocobalamin 1000 MCG tablet Take 100 mcg by mouth daily.   diclofenac 1.3 % Ptch Commonly known as:  FLECTOR Place 1 patch onto the skin 2 (two) times daily.  dorzolamide 2 % ophthalmic solution Commonly known as:  TRUSOPT   DULoxetine 30 MG capsule Commonly known as:  CYMBALTA Take by mouth.   enalapril 10 MG tablet Commonly known as:  VASOTEC   fluticasone 50 MCG/ACT nasal spray Commonly known as:  FLONASE Place 2 sprays into both nostrils daily.   hydrochlorothiazide 25 MG tablet Commonly known as:  HYDRODIURIL Take 25 mg by mouth daily.   HYDROcodone-acetaminophen 5-325 MG tablet Commonly known as:  NORCO/VICODIN Take 1-2 tablets by mouth every 6 (six) hours as needed for moderate pain.   hyoscyamine 0.125 MG tablet Commonly known as:  LEVSIN, ANASPAZ Take by mouth.   latanoprost 0.005 % ophthalmic solution Commonly known as:   XALATAN Place 1 drop into both eyes at bedtime.   magnesium oxide 400 (241.3 Mg) MG tablet Commonly known as:  MAG-OX   metoprolol succinate 25 MG 24 hr tablet Commonly known as:  TOPROL-XL Take 25 mg by mouth daily.   nortriptyline 10 MG capsule Commonly known as:  PAMELOR   omeprazole 20 MG capsule Commonly known as:  PRILOSEC Take 20 mg by mouth 2 (two) times daily before a meal.   oxybutynin 5 MG tablet Commonly known as:  DITROPAN Take 1 tablet (5 mg total) by mouth every 8 (eight) hours as needed for bladder spasms.   tiZANidine 2 MG tablet Commonly known as:  ZANAFLEX TAKE 1 TABLET BY MOUTH AT BEDTIME AS NEEDED.       Allergies:  Allergies  Allergen Reactions  . Iodine Itching  . Ivp Dye [Iodinated Diagnostic Agents]   . Latanoprost   . Penicillins   . Sulfa Antibiotics   . Tape   . Tetracyclines & Related   . Trusopt [Dorzolamide Hcl]   . Zocor [Simvastatin]   . Zovirax [Acyclovir]     Family History: Family History  Problem Relation Age of Onset  . Leukemia Mother   . Diabetes Father   . Breast cancer Sister   . Brain cancer Brother   . Diabetes Sister   . Diabetes Brother   . Prostate cancer Neg Hx   . Kidney cancer Neg Hx   . Bladder Cancer Neg Hx     Social History:  reports that she has never smoked. She has never used smokeless tobacco. She reports that she does not drink alcohol or use drugs.  ROS: UROLOGY Frequent Urination?: Yes Hard to postpone urination?: No Burning/pain with urination?: Yes Get up at night to urinate?: Yes Leakage of urine?: Yes Urine stream starts and stops?: No Trouble starting stream?: No Do you have to strain to urinate?: No Blood in urine?: No Urinary tract infection?: No Sexually transmitted disease?: No Injury to kidneys or bladder?: No Painful intercourse?: No Weak stream?: No Currently pregnant?: No Vaginal bleeding?: No Last menstrual period?: n  Gastrointestinal Nausea?: No Vomiting?:  No Indigestion/heartburn?: No Diarrhea?: No Constipation?: No  Constitutional Fever: No Night sweats?: No Weight loss?: No Fatigue?: No  Skin Skin rash/lesions?: No Itching?: No  Eyes Blurred vision?: No Double vision?: No  Ears/Nose/Throat Sore throat?: No Sinus problems?: No  Hematologic/Lymphatic Swollen glands?: No Easy bruising?: No  Cardiovascular Leg swelling?: No Chest pain?: No  Respiratory Cough?: No Shortness of breath?: No  Endocrine Excessive thirst?: No  Musculoskeletal Back pain?: No Joint pain?: Yes  Neurological Headaches?: No Dizziness?: No  Psychologic Depression?: No Anxiety?: No  Physical Exam: BP 118/67   Pulse 90   Ht 4\' 11"  (1.499 m)   Wt  163 lb (73.9 kg)   BMI 32.92 kg/m   Constitutional:  Alert and oriented, No acute distress. HEENT: [place] AT, moist mucus membranes.  Trachea midline, no masses. Cardiovascular: No clubbing, cyanosis, or edema. Respiratory: Normal respiratory effort, no increased work of breathing.. Skin: No rashes, bruises or suspicious lesions. Neurologic: Grossly intact, no focal deficits, moving all 4 extremities. Psychiatric: Normal mood and affect.  Somewhat tangential.  Laboratory Data: Lab Results  Component Value Date   WBC 9.3 08/23/2018   HGB 13.0 08/23/2018   HCT 39.8 08/23/2018   MCV 92.8 08/23/2018   PLT 289 08/23/2018    Lab Results  Component Value Date   CREATININE 0.94 08/23/2018    Urinalysis Pending- ordered  Pertinent Imaging: Results for orders placed during the hospital encounter of 05/30/18  US RENAL   Narrative CLINICAL DATA:  Current history of BILATERAL nephrolithiasis. Known BILATERAL renal cysts.  EXAM: RENAL / URINARY TRACT ULTRASOUND COMPLETE  COMPARISON:  Lumbar MRI 02/15/2018. CT abdomen and pelvis 10/21/2016, 08/26/2015 and earlier. Urinary tract ultrasound 07/25/2014.  FINDINGS: Right Kidney:  Length: Approximately 9.6 cm. No hydronephrosis. 7 mm  shadowing calculus in an UPPER pole calyx. Adjacent anechoic masses involving the mid kidney measuring approximately 1.8 cm and 1.1 cm respectively. Anechoic mass arising from the LOWER pole measuring approximately 0.9 cm. No solid masses.  Left Kidney:  Length: Approximately 9.9 cm. No hydronephrosis. No visible shadowing calculi. Multiple anechoic masses, the largest arising from the mid kidney measuring approximately 4.1 cm and from the UPPER pole measuring approximately 2.8 cm.  Bladder:  Relatively decompressed. BILATERAL ureteral jets visible at Doppler evaluation. Layering debris within the bladder.  IMPRESSION: 1. Multiple BILATERAL renal cysts as noted on prior imaging. 2. Non-obstructing 7 mm calculus in an UPPER pole calyx of the RIGHT kidney. Previously identified LEFT renal calculi are not visible on today's ultrasound. 3. Layering debris in the urinary bladder, possibly indicating infection. Please correlate with urinalysis.   Electronically Signed   By: Evangeline Dakin M.D.   On: 05/30/2018 16:37    [place] was reviewed again today.  Assessment & Plan:    1. Lesion of bladder Surgical pathology reviewed.  Benign etiology.  No further follow-up for evaluation needed for bladder lesion.  2. Dysuria Likely postop Will check UA/urine culture to assess for infection Supportive care with Perdiem as needed - Urinalysis, Complete w Microscopic; Future  3. Recurrent UTI As above  3. Bilateral nephrolithiasis Asymptomatic  4. Bilateral renal cysts Benign   Return in about 6 months (around 03/09/2019) for shannon f/u recurrent UTI/ cysts/ stones.  Hollice Espy, MD  [facility] [address], [place], [place] [address] [phone]

## 2018-09-10 LAB — URINE CULTURE: Culture: >=100000 — AB

## 2018-09-11 ENCOUNTER — Other Ambulatory Visit: Payer: Self-pay | Admitting: Urology

## 2018-09-11 MED ORDER — NITROFURANTOIN MONOHYD MACRO 100 MG PO CAPS: 100.0000 mg | ORAL_CAPSULE | Freq: Two times a day (BID) | ORAL | 0 refills | Status: DC

## 2018-09-13 ENCOUNTER — Telehealth: Payer: Self-pay

## 2018-09-13 NOTE — Telephone Encounter (Signed)
-----   Message from Hollice Espy, MD sent at 09/11/2018 12:59 PM EST ----- Will  treat with Macrobid 100 mg bid x 7 days.  Script sent to pharmacy.  Called patient and LMOM.  Please ensure that she gets this message after return from holiday break.  Hollice Espy, MD

## 2018-09-13 NOTE — Telephone Encounter (Signed)
Patient notified and states that she did start antibiotics

## 2018-09-20 ENCOUNTER — Other Ambulatory Visit: Payer: Self-pay | Admitting: Family Medicine

## 2018-09-20 ENCOUNTER — Telehealth: Payer: Self-pay | Admitting: Urology

## 2018-09-20 DIAGNOSIS — N329 Bladder disorder, unspecified: Secondary | ICD-10-CM

## 2018-09-20 DIAGNOSIS — N390 Urinary tract infection, site not specified: Secondary | ICD-10-CM

## 2018-09-20 NOTE — Telephone Encounter (Signed)
Pt had surgery about 2 weeks ago, pt has one antibiotic left, pt states she is still burning and would like more antibiotic and feels like her bladder is still trying to come out. Please advise pt at [phone].

## 2018-09-20 NOTE — Telephone Encounter (Signed)
Spoke with patient and she is still having urinary symptoms. I put an order in for another UA, UCX in [place]. She will try to go tomorrow to have the sample collected.

## 2018-09-21 ENCOUNTER — Other Ambulatory Visit
Admission: RE | Admit: 2018-09-21 | Discharge: 2018-09-21 | Disposition: A | Discharge status: Home / Self Care | Payer: Medicare Other | Attending: Urology | Admitting: Urology

## 2018-09-21 DIAGNOSIS — N390 Urinary tract infection, site not specified: Secondary | ICD-10-CM | POA: Insufficient documentation

## 2018-09-21 DIAGNOSIS — N329 Bladder disorder, unspecified: Secondary | ICD-10-CM | POA: Insufficient documentation

## 2018-09-21 LAB — URINALYSIS, COMPLETE (UACMP) WITH MICROSCOPIC
Bilirubin Urine: NEGATIVE
Glucose, UA: NEGATIVE mg/dL
HGB URINE DIPSTICK: NEGATIVE
Ketones, ur: NEGATIVE mg/dL
NITRITE: NEGATIVE
Protein, ur: NEGATIVE mg/dL
RBC / HPF: NONE SEEN RBC/hpf (ref 0–5)
Specific Gravity, Urine: 1.01 (ref 1.005–1.030)
pH: 5.5 (ref 5.0–8.0)

## 2018-09-23 ENCOUNTER — Telehealth: Payer: Self-pay | Admitting: Urology

## 2018-09-23 LAB — URINE CULTURE: Culture: NO GROWTH

## 2018-09-23 NOTE — Telephone Encounter (Signed)
UCx look clear and UA improved.  No evidence of UTI.  This is likely normal post op symptoms which will improve with time.    Hollice Espy, MD

## 2018-09-24 NOTE — Telephone Encounter (Signed)
Patient notified

## 2018-10-02 NOTE — Telephone Encounter (Addendum)
Pt called back and states that she is still having burning. Please call pt.

## 2018-10-05 ENCOUNTER — Telehealth: Payer: Self-pay | Admitting: Urology

## 2018-10-05 NOTE — Telephone Encounter (Signed)
Pt states that she has broken out in a rash up both arms, and still having some burning with urination. She is requesting a call back today please. Thank you, Rosann Auerbach

## 2018-10-05 NOTE — Telephone Encounter (Signed)
Spoke to patient and she states there is vaginal burning. She dosen't think its from her bladder. I informed her to use A and D ointment and I scheduled an appointment for her to see Larene Beach on Monday.

## 2018-10-07 NOTE — Progress Notes (Signed)
10/08/2018 11:47 AM   Anne Steele [birth year] [id number]  Referring provider: Glendon Axe, MD [facility] [place], [place] [address]  Chief Complaint  Patient presents with  . Follow-up    vaginal itching    HPI:  Patient is a 78 year old Caucasian female with nephrolithiasis, urge incontinence, rUTI's and vaginal atrophy who presents today for vaginal burning.    Vaginal burning She states she has been having burning on the outside and up in her vaginal area since her TURBT in 08/2018.  She states it feels like someone is cutting her up in her vagina.  She denies any vaginal discharge or vaginal bleeding.  She states that she has a pain in her lower back and up through her stomach.  Patient denies any gross hematuria, dysuria or suprapubic/flank pain.  Patient denies any fevers, chills, nausea or vomiting.   She has been using A & D ointment with some relief.  She mentioned that she has a fallopian tube left in her and is wondering if this is causing her symptoms.  She does not know if it is the right or left tube.    Nephrolithiasis CT Renal stone study performed on 08/26/2015 noted a bilateral nonobstructive nephrolithiasis as above. No CT evidence for obstructive uropathy.  Scattered cysts within the bilateral kidneys, with 2 small hyperdense lesions within the left kidney as above. These are not significantly changed relative to prior CT from 08/01/2013, and likely reflect small proteinaceous or hemorrhagic cysts given their relative stability.  No other acute intra-abdominal or pelvic process identified.  Status post cholecystectomy.   CT Renal study performed on 10/21/2016 noted bilateral nephrolithiasis, without obstructive uropathy.  Possible constipation.  Multiple renal lesions which are likely cysts and complex cysts, although these are incompletely characterized on this noncontrast.   Exam, relative stability favors benign or indolent lesions.   Hepatic steatosis.  Aortic atherosclerosis  I have independently reviewed the films.   RUS on 05/30/2018 revealed multiple BILATERAL renal cysts as noted on prior imaging.  Non-obstructing 7 mm calculus in an UPPER pole calyx of the RIGHT kidney. Previously identified LEFT renal calculi are not visible on today's ultrasound.  Layering debris in the urinary bladder, possibly indicating infection. Please correlate with urinalysis.  Urge incontinence She states that she is having an increase in her urgency and urge incontinence recently.  She wears pads when going out, but she manages it while at home.    History of rUTI's Risk factors: age, vaginal atrophy, incontinence.  Upper tract imaging above.  Underwent TURBT/RTG's on 08/29/2018.  Pathology was benign.    Vaginal atrophy Not a candidate for vaginal estrogen cream as they are following a breast papilloma.    PMH: Past Medical History:  Diagnosis Date  . Arthritis   . Asthma   . Bell palsy 1973  . Bell's palsy   . Depression   . Fibromyalgia   . GERD (gastroesophageal reflux disease)   . Head ache   . Hiatal hernia   . History of kidney stones   . Hypercalcemia   . Hypertension   . Kidney stones   . Optic nerve disease     Surgical History: Past Surgical History:  Procedure Laterality Date  . ABDOMINAL HYSTERECTOMY  1982  . BREAST BIOPSY Right 03/04/2016   US biopsy/ INTRADUCTAL PAPILLOMA  . BREAST BIOPSY Left 03/04/2016   Fibroadenoma  . CARPAL TUNNEL RELEASE Bilateral 2012  . CATARACT EXTRACTION  Bilateral 2017  . CHOLECYSTECTOMY  2001  . CYSTOSCOPY W/ RETROGRADES Bilateral 08/29/2018   Procedure: CYSTOSCOPY WITH RETROGRADE PYELOGRAM;  Surgeon: Hollice Espy, MD;  Location: [facility] ORS;  Service: Urology;  Laterality: Bilateral;  . CYSTOSCOPY WITH BIOPSY N/A 08/29/2018   Procedure: CYSTOSCOPY WITH Bladder BIOPSY;  Surgeon: Hollice Espy, MD;  Location: [facility] ORS;  Service: Urology;  Laterality: N/A;  . KNEE SURGERY Left      Home Medications:  Allergies as of 10/08/2018      Reactions   Iodine Itching   Ivp Dye [iodinated Diagnostic Agents]    Latanoprost    Penicillins    Sulfa Antibiotics    Tape    Tetracyclines & Related    Trusopt [dorzolamide Hcl]    Zocor [simvastatin]    Zovirax [acyclovir]       Medication List       Accurate as of October 08, 2018 11:47 AM. Always use your most recent med list.        acetaminophen 325 MG tablet Commonly known as:  TYLENOL Take 650 mg by mouth every 6 (six) hours as needed.   alendronate-cholecalciferol 70-2800 MG-UNIT tablet Commonly known as:  FOSAMAX PLUS D Take 1 tablet by mouth every 7 (seven) days. Take with a full glass of water on an empty stomach.   brimonidine 0.2 % ophthalmic solution Commonly known as:  ALPHAGAN   busPIRone 10 MG tablet Commonly known as:  BUSPAR Take 10 mg by mouth 2 (two) times daily.   cetirizine 10 MG tablet Commonly known as:  ZYRTEC Take 10 mg by mouth daily.   cholecalciferol 400 UNIT/ML Liqd Commonly known as:  D-VI-SOL Take 400 Units by mouth daily.   clobetasol cream 0.05 % Commonly known as:  TEMOVATE Apply 1 application topically 2 (two) times daily.   cyanocobalamin 1000 MCG tablet Take 100 mcg by mouth daily.   diclofenac 1.3 % Ptch Commonly known as:  FLECTOR Place 1 patch onto the skin 2 (two) times daily.   dorzolamide 2 % ophthalmic solution Commonly known as:  TRUSOPT   DULoxetine 30 MG capsule Commonly known as:  CYMBALTA Take by mouth.   enalapril 10 MG tablet Commonly known as:  VASOTEC   fluticasone 50 MCG/ACT nasal spray Commonly known as:  FLONASE Place 2 sprays into both nostrils daily.   hydrochlorothiazide 25 MG tablet Commonly known as:  HYDRODIURIL Take 25 mg by mouth daily.   HYDROcodone-acetaminophen 5-325 MG tablet Commonly known as:  NORCO/VICODIN Take 1-2 tablets by mouth every 6 (six) hours as needed for moderate pain.   hyoscyamine 0.125 MG  tablet Commonly known as:  LEVSIN, ANASPAZ Take by mouth.   latanoprost 0.005 % ophthalmic solution Commonly known as:  XALATAN Place 1 drop into both eyes at bedtime.   magnesium oxide 400 (241.3 Mg) MG tablet Commonly known as:  MAG-OX   metoprolol succinate 25 MG 24 hr tablet Commonly known as:  TOPROL-XL Take 25 mg by mouth daily.   nitrofurantoin (macrocrystal-monohydrate) 100 MG capsule Commonly known as:  MACROBID Take 1 capsule (100 mg total) by mouth every 12 (twelve) hours.   nortriptyline 10 MG capsule Commonly known as:  PAMELOR   omeprazole 20 MG capsule Commonly known as:  PRILOSEC Take 20 mg by mouth 2 (two) times daily before a meal.   oxybutynin 5 MG tablet Commonly known as:  DITROPAN Take 1 tablet (5 mg total) by mouth every 8 (eight) hours as needed for bladder spasms.  RHOPRESSA 0.02 % Soln Generic drug:  Netarsudil Dimesylate   tiZANidine 2 MG tablet Commonly known as:  ZANAFLEX TAKE 1 TABLET BY MOUTH AT BEDTIME AS NEEDED.   Travoprost (BAK Free) 0.004 % Soln ophthalmic solution Commonly known as:  TRAVATAN       Allergies:  Allergies  Allergen Reactions  . Iodine Itching  . Ivp Dye [Iodinated Diagnostic Agents]   . Latanoprost   . Penicillins   . Sulfa Antibiotics   . Tape   . Tetracyclines & Related   . Trusopt [Dorzolamide Hcl]   . Zocor [Simvastatin]   . Zovirax [Acyclovir]     Family History: Family History  Problem Relation Age of Onset  . Leukemia Mother   . Diabetes Father   . Breast cancer Sister   . Brain cancer Brother   . Diabetes Sister   . Diabetes Brother   . Prostate cancer Neg Hx   . Kidney cancer Neg Hx   . Bladder Cancer Neg Hx     Social History:  reports that she has never smoked. She has never used smokeless tobacco. She reports that she does not drink alcohol or use drugs.  ROS: UROLOGY Frequent Urination?: No Hard to postpone urination?: No Burning/pain with urination?: No Get up at night to  urinate?: No Leakage of urine?: Yes Urine stream starts and stops?: No Trouble starting stream?: No Do you have to strain to urinate?: No Blood in urine?: No Urinary tract infection?: No Sexually transmitted disease?: No Injury to kidneys or bladder?: No Painful intercourse?: No Weak stream?: No Currently pregnant?: No Vaginal bleeding?: No Last menstrual period?: n  Gastrointestinal Nausea?: No Vomiting?: No Indigestion/heartburn?: No Diarrhea?: No Constipation?: No  Constitutional Fever: No Night sweats?: No Weight loss?: No Fatigue?: No  Skin Skin rash/lesions?: No Itching?: No  Eyes Blurred vision?: No Double vision?: No  Ears/Nose/Throat Sore throat?: No Sinus problems?: No  Hematologic/Lymphatic Swollen glands?: No Easy bruising?: No  Cardiovascular Leg swelling?: No Chest pain?: No  Respiratory Cough?: No Shortness of breath?: No  Endocrine Excessive thirst?: No  Musculoskeletal Back pain?: No Joint pain?: No  Neurological Headaches?: No Dizziness?: No  Psychologic Depression?: No Anxiety?: No  Physical Exam: BP (!) 156/78   Pulse 86   Temp (!) 95.4 F (35.2 C) (Oral)   Ht 4\' 11"  (1.499 m)   Wt 164 lb (74.4 kg)   BMI 33.12 kg/m   Constitutional:  Well nourished. Alert and oriented, No acute distress. HEENT: [place] AT, moist mucus membranes.  Trachea midline, no masses. Cardiovascular: No clubbing, cyanosis, or edema. Respiratory: Normal respiratory effort, no increased work of breathing. GI: Abdomen is soft, non tender, non distended, no abdominal masses. Liver and spleen not palpable.  No hernias appreciated.  Stool sample for occult testing is not indicated.   GU: No CVA tenderness.  No bladder fullness or masses.  Atrophic external genitalia, sparse pubic hair distribution, no lesions.  Normal urethral meatus, no lesions, no prolapse, no discharge.   No urethral masses, tenderness and/or tenderness. No bladder fullness, tenderness  or masses. Pale vagina mucosa, poor estrogen effect, no discharge, no lesions,  pelvic support, grade I cystocele and no rectocele noted.  Cervix and uterus are surgically absent.  No adnexal/parametria masses or tenderness noted.  Anus and perineum are without rashes or lesions.    Skin: No rashes, bruises or suspicious lesions. Lymph: No cervical or inguinal adenopathy. Neurologic: Grossly intact, no focal deficits, moving all 4 extremities. Psychiatric: Normal  mood and affect.   Laboratory Data: Lab Results  Component Value Date   WBC 9.3 08/23/2018   HGB 13.0 08/23/2018   HCT 39.8 08/23/2018   MCV 92.8 08/23/2018   PLT 289 08/23/2018    Lab Results  Component Value Date   CREATININE 0.94 08/23/2018    Lab Results  Component Value Date   AST 21 08/26/2015   Lab Results  Component Value Date   ALT 14 08/26/2015   Urinalysis 21-50 WBC's.  See Epic. I have reviewed the labs.  Assessment & Plan:    1. Vaginal burning  - will prescribe clobetasol - advised patient that this is a steroid cream and can only be used for a short time  - will refer to gynecology to seek their opinion regarding the vaginal burning and what if any follow up she would need for her fallopian tube   - referral sent to gynecology   2. Bilateral renal cysts  - Bilateral renal cysts - benign  3. Bilateral nephrolithiasis No hydro on RUS - only stone in the left kidney was visible on recent ultrasound No intervention warranted at this time  Patient is advised that if they should start to experience pain that is not able to be controlled with pain medication, intractable nausea and/or vomiting and/or fevers greater than 103 or shaking chills to contact the office immediately or seek treatment in the emergency department for emergent intervention.    4. Incontinence Patient is not interested in pursuing treatment at this time - UA is suspicious for infection - will send it to rule out infection as a  cause in the recent worsening of her urinary urgency and incontinence  5. Vaginal atrophy She has an area in her breast that they are monitoring - will hold on prescribing vaginal estrogen cream    Return for refer to gynecology; keep follow up with [place] in 03/11/2019.  These notes generated with voice recognition software. I apologize for typographical errors.  Zara Council, PA-C  [facility] [address] [place], [place] [address] [phone]

## 2018-10-08 ENCOUNTER — Ambulatory Visit (INDEPENDENT_AMBULATORY_CARE_PROVIDER_SITE_OTHER): Payer: Medicare Other | Admitting: Urology

## 2018-10-08 ENCOUNTER — Encounter: Payer: Self-pay | Admitting: Urology

## 2018-10-08 ENCOUNTER — Other Ambulatory Visit: Payer: Self-pay

## 2018-10-08 ENCOUNTER — Other Ambulatory Visit
Admission: RE | Admit: 2018-10-08 | Discharge: 2018-10-08 | Disposition: A | Discharge status: Home / Self Care | Payer: Medicare Other | Attending: Urology | Admitting: Urology

## 2018-10-08 VITALS — BP 156/78 | HR 86 | Temp 95.4°F | Ht 59.0 in | Wt 164.0 lb

## 2018-10-08 DIAGNOSIS — N3946 Mixed incontinence: Secondary | ICD-10-CM

## 2018-10-08 DIAGNOSIS — N200 Calculus of kidney: Secondary | ICD-10-CM

## 2018-10-08 DIAGNOSIS — Z87440 Personal history of urinary (tract) infections: Secondary | ICD-10-CM | POA: Insufficient documentation

## 2018-10-08 DIAGNOSIS — N949 Unspecified condition associated with female genital organs and menstrual cycle: Secondary | ICD-10-CM | POA: Diagnosis not present

## 2018-10-08 DIAGNOSIS — N952 Postmenopausal atrophic vaginitis: Secondary | ICD-10-CM

## 2018-10-08 LAB — URINALYSIS, COMPLETE (UACMP) WITH MICROSCOPIC
Bilirubin Urine: NEGATIVE
Glucose, UA: NEGATIVE mg/dL
Hgb urine dipstick: NEGATIVE
KETONES UR: NEGATIVE mg/dL
Nitrite: NEGATIVE
Protein, ur: NEGATIVE mg/dL
RBC / HPF: NONE SEEN RBC/hpf (ref 0–5)
Specific Gravity, Urine: <1.005 — ABNORMAL LOW (ref 1.005–1.030)
Squamous Epithelial / HPF: NONE SEEN (ref 0–5)
pH: 6 (ref 5.0–8.0)

## 2018-10-08 MED ORDER — CLOBETASOL PROPIONATE 0.05 % EX CREA: 1.0000 "application " | TOPICAL_CREAM | Freq: Two times a day (BID) | CUTANEOUS | 0 refills | Status: DC

## 2018-10-11 ENCOUNTER — Telehealth: Payer: Self-pay

## 2018-10-11 ENCOUNTER — Encounter: Payer: Medicare Other | Admitting: Obstetrics and Gynecology

## 2018-10-11 ENCOUNTER — Telehealth: Payer: Self-pay | Admitting: Urology

## 2018-10-11 LAB — URINE CULTURE: Culture: 60000 — AB

## 2018-10-11 MED ORDER — CIPROFLOXACIN HCL 250 MG PO TABS: 250.0000 mg | ORAL_TABLET | Freq: Two times a day (BID) | ORAL | 0 refills | Status: AC

## 2018-10-11 NOTE — Telephone Encounter (Signed)
Called patient with culture results. Advised patient that Macrobid was sent to pharmacy. She stated that she was on Macrobid in Dec and had a rash appear on her arms. Wanting to know if she can have something else sent in instead.

## 2018-10-11 NOTE — Telephone Encounter (Signed)
Please add Macrobid to her allergies, her reaction is a rash.  I need to know what her allergic reaction is to PCN's.

## 2018-10-11 NOTE — Telephone Encounter (Signed)
-----   Message from Nori Riis, PA-C sent at 10/11/2018  8:41 AM EST ----- Please let Mrs. Barrows know that her urine culture was positive for infection.  She needs to start Macrobid 100 mg, BID for seven days.

## 2018-10-11 NOTE — Telephone Encounter (Signed)
Patient states PCN makes it hard to breath and brings her "asthma back on"

## 2018-10-11 NOTE — Telephone Encounter (Signed)
Rx for Cipro sent to pharmacy. Called and advised patient.

## 2018-10-11 NOTE — Telephone Encounter (Signed)
We can send in Cipro 250 mg, BID for seven days for her.

## 2018-10-17 ENCOUNTER — Ambulatory Visit (INDEPENDENT_AMBULATORY_CARE_PROVIDER_SITE_OTHER): Payer: Medicare Other | Admitting: Obstetrics and Gynecology

## 2018-10-17 ENCOUNTER — Encounter: Payer: Self-pay | Admitting: Obstetrics and Gynecology

## 2018-10-17 DIAGNOSIS — N952 Postmenopausal atrophic vaginitis: Secondary | ICD-10-CM | POA: Diagnosis not present

## 2018-10-17 NOTE — Patient Instructions (Signed)
Use  Http://www.replens.com/ As directed on the box.

## 2018-10-17 NOTE — Progress Notes (Signed)
Obstetrics & Gynecology Office Visit   Chief Complaint  Patient presents with  . vaginal burning    ABX for UTI recently  Referral from [place] for vaginal burning  History of Present Illness: 78 y.o. G2P0010 who is seen in referral from Zara Council, NP, for vaginal burning. She underwent cystoscopic resection of a lesion in her bladder dome (submucosal) and a bilateral retrograde pyelogram.   She underwent the procedure on 12/11.  She began having the symptoms right after the procedure.  She states that she started having vaginal burning that only stopped last week.  She describes the pain as a cutting sensation.  Alleviating factors: ciprofloxacin for UTI. She did try topical clobetasol.  Aggravating factors: nothing she can think of. She states that she is not sexually active.  Associated symptoms: denies. She states that she is status post hysterectomy, but had her fallopian tubes.  She believes the ciprofloxacin was what gave her the most relief.   Past Medical History:  Diagnosis Date  . Arthritis   . Asthma   . Bell palsy 1973  . Bell's palsy   . Depression   . Fibromyalgia   . GERD (gastroesophageal reflux disease)   . Head ache   . Hiatal hernia   . History of kidney stones   . Hypercalcemia   . Hypertension   . Kidney stones   . Optic nerve disease     Past Surgical History:  Procedure Laterality Date  . ABDOMINAL HYSTERECTOMY  1982  . BREAST BIOPSY Right 03/04/2016   US biopsy/ INTRADUCTAL PAPILLOMA  . BREAST BIOPSY Left 03/04/2016   Fibroadenoma  . CARPAL TUNNEL RELEASE Bilateral 2012  . CATARACT EXTRACTION Bilateral 2017  . CHOLECYSTECTOMY  2001  . CYSTOSCOPY W/ RETROGRADES Bilateral 08/29/2018   Procedure: CYSTOSCOPY WITH RETROGRADE PYELOGRAM;  Surgeon: Hollice Espy, MD;  Location: [facility] ORS;  Service: Urology;  Laterality: Bilateral;  . CYSTOSCOPY WITH BIOPSY N/A 08/29/2018   Procedure: CYSTOSCOPY WITH Bladder BIOPSY;  Surgeon:  Hollice Espy, MD;  Location: [facility] ORS;  Service: Urology;  Laterality: N/A;  . KNEE SURGERY Left     Gynecologic History: No LMP recorded. Patient has had a hysterectomy.  Obstetric History: G2P0010  Family History  Problem Relation Age of Onset  . Leukemia Mother   . Diabetes Father   . Breast cancer Sister   . Brain cancer Brother   . Diabetes Sister   . Diabetes Brother   . Prostate cancer Neg Hx   . Kidney cancer Neg Hx   . Bladder Cancer Neg Hx     Social History   Socioeconomic History  . Marital status: Legally Separated    Spouse name: Not on file  . Number of children: Not on file  . Years of education: Not on file  . Highest education level: Not on file  Occupational History  . Not on file  Social Needs  . Financial resource strain: Not on file  . Food insecurity:    Worry: Not on file    Inability: Not on file  . Transportation needs:    Medical: Not on file    Non-medical: Not on file  Tobacco Use  . Smoking status: Never Smoker  . Smokeless tobacco: Never Used  Substance and Sexual Activity  . Alcohol use: No    Alcohol/week: 0.0 standard drinks  . Drug use: No  . Sexual activity: Not Currently  Lifestyle  . Physical activity:    Days per week:  Not on file    Minutes per session: Not on file  . Stress: Not on file  Relationships  . Social connections:    Talks on phone: Not on file    Gets together: Not on file    Attends religious service: Not on file    Active member of club or organization: Not on file    Attends meetings of clubs or organizations: Not on file    Relationship status: Not on file  . Intimate partner violence:    Fear of current or ex partner: Not on file    Emotionally abused: Not on file    Physically abused: Not on file    Forced sexual activity: Not on file  Other Topics Concern  . Not on file  Social History Narrative  . Not on file    Allergies  Allergen Reactions  . Penicillins Shortness Of Breath     Patient states it "brings her asthma back on"  . Iodine Itching  . Ivp Dye [Iodinated Diagnostic Agents]   . Latanoprost   . Sulfa Antibiotics   . Tape   . Tetracyclines & Related   . Trusopt [Dorzolamide Hcl]   . Zocor [Simvastatin]   . Zovirax [Acyclovir]   . Macrobid [Nitrofurantoin] Rash    Prior to Admission medications   Medication Sig Start Date End Date Taking? Authorizing Provider  acetaminophen (TYLENOL) 325 MG tablet Take 650 mg by mouth every 6 (six) hours as needed.   Yes [provider]  alendronate-cholecalciferol (FOSAMAX PLUS D) 70-2800 MG-UNIT tablet Take 1 tablet by mouth every 7 (seven) days. Take with a full glass of water on an empty stomach.   Yes [provider]  brimonidine (ALPHAGAN) 0.2 % ophthalmic solution  05/18/18  Yes [provider]  busPIRone (BUSPAR) 10 MG tablet Take 10 mg by mouth 2 (two) times daily.   Yes [provider]  cetirizine (ZYRTEC) 10 MG tablet Take 10 mg by mouth daily.   Yes [provider]  cholecalciferol (D-VI-SOL) 400 UNIT/ML LIQD Take 400 Units by mouth daily.   Yes [provider]  ciprofloxacin (CIPRO) 250 MG tablet Take 1 tablet (250 mg total) by mouth 2 (two) times daily for 7 days. 10/11/18 10/18/18 Yes McGowan, Larene Beach A, PA-C  clobetasol cream (TEMOVATE) 5.80 % Apply 1 application topically 2 (two) times daily. 10/08/18  Yes McGowan, Larene Beach A, PA-C  cyanocobalamin 1000 MCG tablet Take 100 mcg by mouth daily.   Yes [provider]  diclofenac (FLECTOR) 1.3 % PTCH Place 1 patch onto the skin 2 (two) times daily.   Yes [provider]  dorzolamide (TRUSOPT) 2 % ophthalmic solution  06/04/18  Yes [provider]  DULoxetine (CYMBALTA) 30 MG capsule Take by mouth. 05/01/18 05/01/19 Yes [provider]  enalapril (VASOTEC) 10 MG tablet  05/23/18  Yes [provider]  fluticasone (FLONASE) 50 MCG/ACT nasal spray Place 2 sprays into both  nostrils daily.   Yes [provider]  hydrochlorothiazide (HYDRODIURIL) 25 MG tablet Take 25 mg by mouth daily.   Yes [provider]  HYDROcodone-acetaminophen (NORCO/VICODIN) 5-325 MG tablet Take 1-2 tablets by mouth every 6 (six) hours as needed for moderate pain. 08/29/18  Yes Hollice Espy, MD  hyoscyamine (LEVSIN, ANASPAZ) 0.125 MG tablet Take by mouth.   Yes [provider]  latanoprost (XALATAN) 0.005 % ophthalmic solution Place 1 drop into both eyes at bedtime.   Yes [provider]  magnesium oxide (  MAG-OX) 400 (241.3 Mg) MG tablet  05/23/18  Yes [provider]  metoprolol succinate (TOPROL-XL) 25 MG 24 hr tablet Take 25 mg by mouth daily.   Yes [provider]  nitrofurantoin, macrocrystal-monohydrate, (MACROBID) 100 MG capsule Take 1 capsule (100 mg total) by mouth every 12 (twelve) hours. 09/11/18  Yes Hollice Espy, MD  nortriptyline (PAMELOR) 10 MG capsule  05/23/18  Yes [provider]  omeprazole (PRILOSEC) 20 MG capsule Take 20 mg by mouth 2 (two) times daily before a meal.   Yes [provider]  oxybutynin (DITROPAN) 5 MG tablet Take 1 tablet (5 mg total) by mouth every 8 (eight) hours as needed for bladder spasms. 08/29/18  Yes Hollice Espy, MD  RHOPRESSA 0.02 % SOLN  08/31/18  Yes [provider]  tiZANidine (ZANAFLEX) 2 MG tablet TAKE 1 TABLET BY MOUTH AT BEDTIME AS NEEDED. 10/10/16  Yes [provider]  Travoprost, BAK Free, (TRAVATAN) 0.004 % SOLN ophthalmic solution  10/05/18  Yes [provider]    Review of Systems  Constitutional: Negative.   HENT: Negative.   Eyes: Negative.   Respiratory: Negative.   Cardiovascular: Negative.   Gastrointestinal: Negative.   Genitourinary: Negative.        See HPI  Musculoskeletal: Negative.   Skin: Negative.   Neurological: Negative.   Psychiatric/Behavioral: Negative.      Physical Exam BP (!) 175/85 (BP Location: Left  Arm, Patient Position: Sitting, Cuff Size: Normal)   Pulse (!) 102   Ht 4\' 11"  (1.499 m)   Wt 165 lb (74.8 kg)   BMI 33.33 kg/m  No LMP recorded. Patient has had a hysterectomy. Physical Exam Constitutional:      General: She is not in acute distress.    Appearance: Normal appearance.  Genitourinary:     Pelvic exam was performed with patient supine.     Vulva, urethra, bladder, right adnexa and left adnexa normal.     Vaginal tenderness, atrophy and atrophic mucosa present.     No vaginal rugosity.     Cervix is absent.     Uterus is absent.  HENT:     Head: Normocephalic and atraumatic.  Eyes:     General: No scleral icterus.    Conjunctiva/sclera: Conjunctivae normal.  Neurological:     General: No focal deficit present.     Mental Status: She is alert and oriented to person, place, and time.     Cranial Nerves: No cranial nerve deficit.  Psychiatric:        Mood and Affect: Mood normal.        Behavior: Behavior normal.        Judgment: Judgment normal.    Female chaperone present for pelvic and breast  portions of the physical exam  Assessment: 78 y.o. G76P0010 female here for  1. Atrophic vaginitis      Plan: Problem List Items Addressed This Visit      Genitourinary   Atrophic vaginitis     Since she is feeling much better today after taking antibiotics, I encouraged her to use a topical moisturizing gel (like Replens) daily until her symptoms subsided.  This is a new-onset problem for her that happened right after a procedure and appears to have greatly improved with the treatment of her UTI.  So, it is likely her symptoms will completely resolve.  For now she may use a topical vaginal moisturizer. In the future, if symptoms persist, and her breast papilloma is resolved,  she might be a good candidate for very low dose topical estrogen.    30 minutes spent in face to face discussion with > 50% spent in counseling,management, and coordination of care of her  atrophic vaginitis.   Prentice Docker, MD 10/17/2018 5:23 PM     CC: Nori Riis, PA-C [place], [place] [id number]

## 2018-11-01 ENCOUNTER — Telehealth: Payer: Self-pay | Admitting: Urology

## 2018-11-01 NOTE — Telephone Encounter (Signed)
Spoke with [facility] and advised her not to take the AZO anymore as she has been on the medication for the last three days.  I encouraged her to increase her water intake, drink cranberry juice and to use the vaginal estrogen cream daily until she sees me on Monday.  She was not able to come earlier due to transportation issues.

## 2018-11-01 NOTE — Telephone Encounter (Signed)
Previously addressed see other telephone encounter

## 2018-11-01 NOTE — Telephone Encounter (Signed)
Pt has appt on Monday w/[facility] in [place] and wants to know if we can call something in for her to make it through the weekend.  She is burning and has chills when she pees.  She uses [facility] in [place] for pharmacy.  They close at 6.

## 2018-11-04 NOTE — Progress Notes (Signed)
11/05/2018 12:04 PM   Anne Steele 02-09-1941 [id number]  Referring provider: Glendon Axe, MD [facility] [place], [place] [address]  Chief Complaint  Patient presents with  . Follow-up    UA Cath    HPI:  Patient is a 78 year old Caucasian female with nephrolithiasis, urge incontinence, rUTI's and vaginal atrophy who presents today for vaginal burning.    Vaginal burning She is still having burning "down there."  She has also seen gynecology.  It has been recommended that she use the Replens vaginal moisturizer and she has found that helpful.   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  If it should resolve, we can consider using a low dose vaginal estrogen cream.   Nephrolithiasis CT Renal stone study performed on 08/26/2015 noted a bilateral nonobstructive nephrolithiasis as above. No CT evidence for obstructive uropathy.  Scattered cysts within the bilateral kidneys, with 2 small hyperdense lesions within the left kidney as above. These are not significantly changed relative to prior CT from 08/01/2013, and likely reflect small proteinaceous or hemorrhagic cysts given their relative stability.  No other acute intra-abdominal or pelvic process identified.  Status post cholecystectomy.   CT Renal study performed on 10/21/2016 noted bilateral nephrolithiasis, without obstructive uropathy.  Possible constipation.  Multiple renal lesions which are likely cysts and complex cysts, although these are incompletely characterized on this noncontrast.   Exam, relative stability favors benign or indolent lesions.  Hepatic steatosis.  Aortic atherosclerosis  I have independently reviewed the films.  RUS on 05/30/2018 revealed multiple BILATERAL renal cysts as noted on prior imaging.  Non-obstructing 7 mm calculus in an UPPER pole calyx of the RIGHT kidney. Previously identified LEFT renal calculi are not visible on  today's ultrasound.  Layering debris in the urinary bladder, possibly indicating infection. Please correlate with urinalysis.  Patinet has underwent a TURBT/RTG's on 12/11/202019.  Pathology was benign.    Urge incontinence She continues to have an increase in her urgency and urge incontinence recently which she is contributing to a possible UTI.  She wears pads when going out, but she manages it while at home.    History of rUTI's Risk factors: age, vaginal atrophy, incontinence.  Upper tract imaging above.  Underwent TURBT/RTG's on 08/29/2018.  Pathology was benign.  She states she is burning "down there," but she cannot be more specific.  CATH UA today demonstrates 11-20 WBC's.     Vaginal atrophy Not a candidate for vaginal estrogen cream as they are following a breast papilloma.    PMH: Past Medical History:  Diagnosis Date  . Arthritis   . Asthma   . Bell palsy 1973  . Bell's palsy   . Depression   . Fibromyalgia   . GERD (gastroesophageal reflux disease)   . Head ache   . Hiatal hernia   . History of kidney stones   . Hypercalcemia   . Hypertension   . Kidney stones   . Optic nerve disease     Surgical History: Past Surgical History:  Procedure Laterality Date  . ABDOMINAL HYSTERECTOMY  1982  . BREAST BIOPSY Right 03/04/2016   US biopsy/ INTRADUCTAL PAPILLOMA  . BREAST BIOPSY Left 03/04/2016   Fibroadenoma  . CARPAL TUNNEL RELEASE Bilateral 2012  . CATARACT EXTRACTION Bilateral 2017  . CHOLECYSTECTOMY  2001  . CYSTOSCOPY W/ RETROGRADES Bilateral 08/29/2018   Procedure: CYSTOSCOPY WITH RETROGRADE PYELOGRAM;  Surgeon: Hollice Espy, MD;  Location: [facility] ORS;  Service: Urology;  Laterality: Bilateral;  . CYSTOSCOPY WITH BIOPSY N/A 08/29/2018   Procedure: CYSTOSCOPY WITH Bladder BIOPSY;  Surgeon: Hollice Espy, MD;  Location: [facility] ORS;  Service: Urology;  Laterality: N/A;  . KNEE SURGERY Left     Home Medications:  Allergies as of 11/05/2018      Reactions    Penicillins Shortness Of Breath   Patient states it "brings her asthma back on"   Duloxetine Other (See Comments)   nightmares   Iodine Itching   Ivp Dye [iodinated Diagnostic Agents]    Latanoprost    Sulfa Antibiotics    Tape    Tetracyclines & Related    Trusopt [dorzolamide Hcl]    Zocor [simvastatin]    Zovirax [acyclovir]    Macrobid [nitrofurantoin] Rash      Medication List       Accurate as of November 05, 2018 12:04 PM. Always use your most recent med list.        acetaminophen 325 MG tablet Commonly known as:  TYLENOL Take 650 mg by mouth every 6 (six) hours as needed.   alendronate-cholecalciferol 70-2800 MG-UNIT tablet Commonly known as:  FOSAMAX PLUS D Take 1 tablet by mouth every 7 (seven) days. Take with a full glass of water on an empty stomach.   brimonidine 0.2 % ophthalmic solution Commonly known as:  ALPHAGAN   busPIRone 10 MG tablet Commonly known as:  BUSPAR Take 10 mg by mouth 2 (two) times daily.   cetirizine 10 MG tablet Commonly known as:  ZYRTEC Take 10 mg by mouth daily.   cholecalciferol 400 UNIT/ML Liqd Commonly known as:  D-VI-SOL Take 400 Units by mouth daily.   cyanocobalamin 1000 MCG tablet Take 100 mcg by mouth daily.   diclofenac 1.3 % Ptch Commonly known as:  FLECTOR Place 1 patch onto the skin 2 (two) times daily.   dorzolamide 2 % ophthalmic solution Commonly known as:  TRUSOPT   DULoxetine 30 MG capsule Commonly known as:  CYMBALTA Take by mouth.   enalapril 10 MG tablet Commonly known as:  VASOTEC   fluticasone 50 MCG/ACT nasal spray Commonly known as:  FLONASE Place 2 sprays into both nostrils daily.   hydrochlorothiazide 25 MG tablet Commonly known as:  HYDRODIURIL Take 25 mg by mouth daily.   HYDROcodone-acetaminophen 5-325 MG tablet Commonly known as:  NORCO/VICODIN Take 1-2 tablets by mouth every 6 (six) hours as needed for moderate pain.   hyoscyamine 0.125 MG tablet Commonly known as:  LEVSIN,  ANASPAZ Take by mouth.   latanoprost 0.005 % ophthalmic solution Commonly known as:  XALATAN Place 1 drop into both eyes at bedtime.   magnesium oxide 400 (241.3 Mg) MG tablet Commonly known as:  MAG-OX   metoprolol succinate 25 MG 24 hr tablet Commonly known as:  TOPROL-XL Take 25 mg by mouth daily.   nitrofurantoin (macrocrystal-monohydrate) 100 MG capsule Commonly known as:  MACROBID Take 1 capsule (100 mg total) by mouth every 12 (twelve) hours.   nortriptyline 10 MG capsule Commonly known as:  PAMELOR   omeprazole 20 MG capsule Commonly known as:  PRILOSEC Take 20 mg by mouth 2 (two) times daily before a meal.   oxybutynin 5 MG tablet Commonly known as:  DITROPAN Take 1 tablet (5 mg total) by mouth every 8 (eight) hours as needed for bladder spasms.   RHOPRESSA 0.02 % Soln Generic drug:  Netarsudil Dimesylate   tiZANidine 2 MG  tablet Commonly known as:  ZANAFLEX TAKE 1 TABLET BY MOUTH AT BEDTIME AS NEEDED.   Travoprost (BAK Free) 0.004 % Soln ophthalmic solution Commonly known as:  TRAVATAN   triamcinolone cream 0.1 % Commonly known as:  KENALOG Apply topically.   triamcinolone cream 0.1 % Commonly known as:  KENALOG       Allergies:  Allergies  Allergen Reactions  . Penicillins Shortness Of Breath    Patient states it "brings her asthma back on"  . Duloxetine Other (See Comments)    nightmares  . Iodine Itching  . Ivp Dye [Iodinated Diagnostic Agents]   . Latanoprost   . Sulfa Antibiotics   . Tape   . Tetracyclines & Related   . Trusopt [Dorzolamide Hcl]   . Zocor [Simvastatin]   . Zovirax [Acyclovir]   . Macrobid [Nitrofurantoin] Rash    Family History: Family History  Problem Relation Age of Onset  . Leukemia Mother   . Diabetes Father   . Breast cancer Sister   . Brain cancer Brother   . Diabetes Sister   . Diabetes Brother   . Prostate cancer Neg Hx   . Kidney cancer Neg Hx   . Bladder Cancer Neg Hx     Social History:   reports that she has never smoked. She has never used smokeless tobacco. She reports that she does not drink alcohol or use drugs.  ROS: UROLOGY Frequent Urination?: No Hard to postpone urination?: No Burning/pain with urination?: Yes Get up at night to urinate?: No Leakage of urine?: No Urine stream starts and stops?: No Trouble starting stream?: No Do you have to strain to urinate?: No Blood in urine?: No Urinary tract infection?: No Sexually transmitted disease?: No Injury to kidneys or bladder?: No Painful intercourse?: No Weak stream?: No Currently pregnant?: No Vaginal bleeding?: No Last menstrual period?: n  Gastrointestinal Nausea?: No Vomiting?: No Indigestion/heartburn?: No Diarrhea?: No Constipation?: No  Constitutional Fever: No Night sweats?: No Weight loss?: No Fatigue?: No  Skin Skin rash/lesions?: No Itching?: No  Eyes Blurred vision?: No Double vision?: No  Ears/Nose/Throat Sore throat?: No Sinus problems?: No  Hematologic/Lymphatic Swollen glands?: No Easy bruising?: No  Cardiovascular Leg swelling?: No Chest pain?: No  Respiratory Cough?: No Shortness of breath?: No  Endocrine Excessive thirst?: No  Musculoskeletal Back pain?: No Joint pain?: No  Neurological Headaches?: No Dizziness?: No  Psychologic Depression?: No Anxiety?: No  Physical Exam: BP (!) 158/83   Pulse (!) 115   Temp (!) 97.2 F (36.2 C) (Oral)   Resp 18   Ht 4\' 11"  (1.499 m)   Wt 161 lb (73 kg)   BMI 32.52 kg/m   Constitutional:  Well nourished. Alert and oriented, No acute distress. HEENT:  AT, moist mucus membranes.  Trachea midline, no masses. Cardiovascular: No clubbing, cyanosis, or edema. Respiratory: Normal respiratory effort, no increased work of breathing. GI: Abdomen is soft, non tender, non distended, no abdominal masses. Liver and spleen not palpable.  No hernias appreciated.  Stool sample for occult testing is not indicated.     GU: No CVA tenderness.  No bladder fullness or masses.  atrophic external genitalia, sparse pubic hair distribution, no lesions.  Normal urethral meatus, no lesions, no prolapse, no discharge.   No urethral masses, tenderness and/or tenderness. No bladder fullness, tenderness or masses. Pale vagina mucosa, poor estrogen effect, no discharge, no lesions, fair pelvic support.   Skin: Bright red rash on checks bilaterally.   Neurologic: Grossly intact,  no focal deficits, moving all 4 extremities. Psychiatric: Normal mood and affect.   Laboratory Data: Lab Results  Component Value Date   WBC 9.3 08/23/2018   HGB 13.0 08/23/2018   HCT 39.8 08/23/2018   MCV 92.8 08/23/2018   PLT 289 08/23/2018    Lab Results  Component Value Date   CREATININE 0.94 08/23/2018    Lab Results  Component Value Date   AST 21 08/26/2015   Lab Results  Component Value Date   ALT 14 08/26/2015   Urinalysis CATH UA 11-20 WBC's.  See Epic. I have reviewed the labs.  In and Out Catheterization Patient is present today for a I & O catheterization due to burning in the vaginal/suprapubic area. Patient was cleaned and prepped in a sterile fashion with Hibiclens and Lidocaine 2% jelly was instilled into the urethra.  A 14 FR cath was inserted no complications were noted , 50 ml of urine return was noted, urine was yellow in color. A clean urine sample was collected for UA and culture. Bladder was drained  And catheter was removed with out difficulty.    Preformed by: Zara Council, PA-C   Assessment & Plan:    1. Vaginal burning -continue Replens for discomfort -if breast papilloma resolves will consider low dose vaginal estrogen cream  2. Vaginal atrophy -see above   3. Urge Incontinence -UA sent for culture as she states this has worsened with the burning down there - will hold on prescribing antibiotic until culture and sensitivities are available -will reassess once culture results are  available  Return for pending urine culture .  These notes generated with voice recognition software. I apologize for typographical errors.  Zara Council, PA-C  [facility] [address] [place], [place] [address] [phone]

## 2018-11-05 ENCOUNTER — Encounter: Payer: Self-pay | Admitting: Urology

## 2018-11-05 ENCOUNTER — Ambulatory Visit (INDEPENDENT_AMBULATORY_CARE_PROVIDER_SITE_OTHER): Payer: Medicare Other | Admitting: Urology

## 2018-11-05 ENCOUNTER — Other Ambulatory Visit: Payer: Self-pay

## 2018-11-05 ENCOUNTER — Other Ambulatory Visit
Admission: RE | Admit: 2018-11-05 | Discharge: 2018-11-05 | Disposition: A | Discharge status: Home / Self Care | Payer: Medicare Other | Source: Ambulatory Visit | Attending: Urology | Admitting: Urology

## 2018-11-05 VITALS — BP 158/83 | HR 115 | Temp 97.2°F | Resp 18 | Ht 59.0 in | Wt 161.0 lb

## 2018-11-05 DIAGNOSIS — N949 Unspecified condition associated with female genital organs and menstrual cycle: Secondary | ICD-10-CM

## 2018-11-05 DIAGNOSIS — N952 Postmenopausal atrophic vaginitis: Secondary | ICD-10-CM | POA: Diagnosis not present

## 2018-11-05 DIAGNOSIS — N3941 Urge incontinence: Secondary | ICD-10-CM

## 2018-11-05 DIAGNOSIS — R300 Dysuria: Secondary | ICD-10-CM | POA: Diagnosis present

## 2018-11-05 LAB — URINALYSIS, COMPLETE (UACMP) WITH MICROSCOPIC
Bacteria, UA: NONE SEEN
Bilirubin Urine: NEGATIVE
Glucose, UA: NEGATIVE mg/dL
Hgb urine dipstick: NEGATIVE
Ketones, ur: NEGATIVE mg/dL
Nitrite: NEGATIVE
Specific Gravity, Urine: 1.02 (ref 1.005–1.030)
Squamous Epithelial / HPF: NONE SEEN (ref 0–5)
pH: 5.5 (ref 5.0–8.0)

## 2018-11-06 LAB — URINE CULTURE: Culture: <10000 — AB

## 2018-11-13 ENCOUNTER — Other Ambulatory Visit: Payer: Self-pay | Admitting: Internal Medicine

## 2018-11-13 DIAGNOSIS — Z1231 Encounter for screening mammogram for malignant neoplasm of breast: Secondary | ICD-10-CM

## 2019-01-07 ENCOUNTER — Telehealth: Payer: Self-pay | Admitting: Urology

## 2019-01-07 NOTE — Telephone Encounter (Signed)
We cannot give pain medication without her being seen.  Would she be able to do a virtual visit sometime this week?

## 2019-01-07 NOTE — Telephone Encounter (Signed)
Pt is still having a lot of burning and pain in lower area. She would like something called in for pain. She states that Tylenol is not helping. Please advise.

## 2019-01-07 NOTE — Telephone Encounter (Signed)
Please advise. Thanks.  

## 2019-01-08 ENCOUNTER — Other Ambulatory Visit: Payer: Self-pay

## 2019-01-08 ENCOUNTER — Telehealth (INDEPENDENT_AMBULATORY_CARE_PROVIDER_SITE_OTHER): Payer: Medicare Other | Admitting: Urology

## 2019-01-08 ENCOUNTER — Telehealth: Payer: Self-pay | Admitting: Urology

## 2019-01-08 DIAGNOSIS — N952 Postmenopausal atrophic vaginitis: Secondary | ICD-10-CM | POA: Diagnosis not present

## 2019-01-08 DIAGNOSIS — N3289 Other specified disorders of bladder: Secondary | ICD-10-CM

## 2019-01-08 MED ORDER — OXYBUTYNIN CHLORIDE 5 MG PO TABS: 5.0000 mg | ORAL_TABLET | Freq: Three times a day (TID) | ORAL | 3 refills | Status: DC | PRN

## 2019-01-08 NOTE — Telephone Encounter (Signed)
Would you call Anne Steele and schedule her for a 6 weeks office visit for an OAB questionnaire, PVR and exam?

## 2019-01-08 NOTE — Progress Notes (Signed)
Virtual Visit via Telephone Note  I connected with Salem Senate on 01/08/2019 at 0912 by telephone and verified that I am speaking with the correct person using two identifiers.  They are located at home.  I am located at my home.    This visit type was conducted due to national recommendations for restrictions regarding the COVID-19 Pandemic (e.g. social distancing).  This format is felt to be most appropriate for this patient at this time.  All issues noted in this document were discussed and addressed.  No physical exam was performed.   I discussed the limitations, risks, security and privacy concerns of performing an evaluation and management service by telephone and the availability of in person appointments. I also discussed with the patient that there may be a patient responsible charge related to this service. The patient expressed understanding and agreed to proceed.   History of Present Illness: Anne Steele is a 78 year old female who persistent vaginal burning, the feeling that "something is falling out" and urge incontinence since 07/2018.  She has vaginal atrophy seen on past exams.  She has a burning sensation down in her vaginal area that is worsened by urination.  She has found the Replens and A&D ointment helpful in controlling the burning pain.  She is not a candidate at this time for vaginal estrogen cream and this time as they are following a breast papilloma.    She also feels that something is trying to come out of her vagina.  She describes the feeling as having a tampon in place.  She has noted that straining to have a BM or using gardening tools causes the feeling to worsen.  She stated that the oxybutynin helped with this pain.    She is drinking four bottles of water daily, two glasses of sweet tea, 2 cups of coffee and an occasional Pepsi/Dr. Pepper.    Observations/Objective: Patient was hard to keep on topic.  She did not sound distressed on the phone.    Assessment  and Plan:  1. Vaginal atrophy Patient will continue with the Replens and A&D ointment as she finds them helpful She will contact her gynecologist about a repeat mammogram to see if the breast papilloma is stable and it is safe for her to start vaginal estrogen cream  2. Bladder spasms Sent in a refill for oxybutynin 5 mg TID  RTC in 6 weeks for an OAB questionnaire, PVR and exam   Follow Up Instructions:  Anne Steele will follow up in 6 weeks for an OAB questionnaire, PVR and exam.     I discussed the assessment and treatment plan with the patient. The patient was provided an opportunity to ask questions and all were answered. The patient agreed with the plan and demonstrated an understanding of the instructions.   The patient was advised to call back or seek an in-person evaluation if the symptoms worsen or if the condition fails to improve as anticipated.  I provided 30 minutes of non-face-to-face time during this encounter.   Noa Galvao, PA-C

## 2019-02-19 ENCOUNTER — Ambulatory Visit: Payer: Medicare Other | Admitting: Urology

## 2019-03-01 ENCOUNTER — Other Ambulatory Visit: Payer: Self-pay | Admitting: Internal Medicine

## 2019-03-01 ENCOUNTER — Other Ambulatory Visit: Payer: Self-pay

## 2019-03-01 ENCOUNTER — Ambulatory Visit
Admission: RE | Admit: 2019-03-01 | Discharge: 2019-03-01 | Disposition: A | Discharge status: Home / Self Care | Payer: Medicare Other | Source: Ambulatory Visit | Attending: Internal Medicine | Admitting: Internal Medicine

## 2019-03-01 DIAGNOSIS — Z1231 Encounter for screening mammogram for malignant neoplasm of breast: Secondary | ICD-10-CM | POA: Insufficient documentation

## 2019-03-05 ENCOUNTER — Other Ambulatory Visit: Payer: Self-pay

## 2019-03-05 ENCOUNTER — Encounter: Payer: Self-pay | Admitting: General Surgery

## 2019-03-05 ENCOUNTER — Ambulatory Visit: Payer: Medicare Other | Admitting: General Surgery

## 2019-03-05 VITALS — BP 183/83 | HR 99 | Temp 97.7°F | Resp 18 | Ht 59.0 in | Wt 159.4 lb

## 2019-03-05 DIAGNOSIS — Z1231 Encounter for screening mammogram for malignant neoplasm of breast: Secondary | ICD-10-CM

## 2019-03-05 DIAGNOSIS — N6082 Other benign mammary dysplasias of left breast: Secondary | ICD-10-CM | POA: Diagnosis not present

## 2019-03-05 HISTORY — PX: BREAST EXCISIONAL BIOPSY: SUR124

## 2019-03-05 NOTE — Patient Instructions (Addendum)
The patient is aware to call back for any questions or new concerns.  May shower May remove water proof dressing in 2-3 days if you want.  You will need to return in 1 week for suture removal. May use an Ice pack as needed for comfort.

## 2019-03-05 NOTE — Progress Notes (Signed)
Patient ID: Anne Steele, female   DOB: [birth year], 78 y.o.   MRN: [id number]  Chief Complaint  Patient presents with  . Follow-up     f/u left breast cyst    HPI Anne Steele is a 78 y.o. female.  Here for follow up left breast cyst.  She states it is tender and sore "burning" x 3 weeks. She is using warm compresses and Vaseline to the area. Last mammogram 12-08-17.  HPI  Past Medical History:  Diagnosis Date  . Arthritis   . Asthma   . Bell palsy 1973  . Bell's palsy   . Depression   . Fibromyalgia   . GERD (gastroesophageal reflux disease)   . Head ache   . Hiatal hernia   . History of kidney stones   . Hypercalcemia   . Hypertension   . Kidney stones   . Optic nerve disease     Past Surgical History:  Procedure Laterality Date  . ABDOMINAL HYSTERECTOMY  1982  . BREAST BIOPSY Right 03/04/2016   US biopsy/ INTRADUCTAL PAPILLOMA  . BREAST BIOPSY Left 03/04/2016   Fibroadenoma  . CARPAL TUNNEL RELEASE Bilateral 2012  . CATARACT EXTRACTION Bilateral 2017  . CHOLECYSTECTOMY  2001  . CYSTOSCOPY W/ RETROGRADES Bilateral 08/29/2018   Procedure: CYSTOSCOPY WITH RETROGRADE PYELOGRAM;  Surgeon: Hollice Espy, MD;  Location: [facility] ORS;  Service: Urology;  Laterality: Bilateral;  . CYSTOSCOPY WITH BIOPSY N/A 08/29/2018   Procedure: CYSTOSCOPY WITH Bladder BIOPSY;  Surgeon: Hollice Espy, MD;  Location: [facility] ORS;  Service: Urology;  Laterality: N/A;  . KNEE SURGERY Left     Family History  Problem Relation Age of Onset  . Leukemia Mother   . Diabetes Father   . Breast cancer Sister   . Brain cancer Brother   . Diabetes Sister   . Diabetes Brother   . Prostate cancer Neg Hx   . Kidney cancer Neg Hx   . Bladder Cancer Neg Hx     Social History Social History   Tobacco Use  . Smoking status: Never Smoker  . Smokeless tobacco: Never Used  Substance Use Topics  . Alcohol use: No    Alcohol/week: 0.0 standard drinks  . Drug use: No    Allergies  Allergen  Reactions  . Penicillins Shortness Of Breath    Patient states it "brings her asthma back on"  . Duloxetine Other (See Comments)    nightmares  . Iodine Itching  . Ivp Dye [Iodinated Diagnostic Agents]   . Latanoprost   . Tape   . Tetracyclines & Related   . Trusopt [Dorzolamide Hcl]   . Zocor [Simvastatin]   . Zovirax [Acyclovir]   . Macrobid [Nitrofurantoin] Rash  . Sulfa Antibiotics Rash    Current Outpatient Medications  Medication Sig Dispense Refill  . acetaminophen (TYLENOL) 325 MG tablet Take 650 mg by mouth every 6 (six) hours as needed.    Marland Kitchen alendronate-cholecalciferol (FOSAMAX PLUS D) 70-2800 MG-UNIT tablet Take 1 tablet by mouth every 7 (seven) days. Take with a full glass of water on an empty stomach.    . brimonidine (ALPHAGAN) 0.2 % ophthalmic solution   4  . busPIRone (BUSPAR) 10 MG tablet Take 10 mg by mouth 2 (two) times daily.    . cetirizine (ZYRTEC) 10 MG tablet Take 10 mg by mouth daily.    . cholecalciferol (D-VI-SOL) 400 UNIT/ML LIQD Take 400 Units by mouth daily.    . cyanocobalamin 1000 MCG tablet Take 100  mcg by mouth daily.    . diclofenac (FLECTOR) 1.3 % PTCH Place 1 patch onto the skin 2 (two) times daily.    . dorzolamide (TRUSOPT) 2 % ophthalmic solution   5  . DULoxetine (CYMBALTA) 30 MG capsule Take by mouth.    . enalapril (VASOTEC) 10 MG tablet   2  . fluticasone (FLONASE) 50 MCG/ACT nasal spray Place 2 sprays into both nostrils daily.    . hydrochlorothiazide (HYDRODIURIL) 25 MG tablet Take 25 mg by mouth daily.    Marland Kitchen HYDROcodone-acetaminophen (NORCO/VICODIN) 5-325 MG tablet Take 1-2 tablets by mouth every 6 (six) hours as needed for moderate pain. 6 tablet 0  . hyoscyamine (LEVSIN, ANASPAZ) 0.125 MG tablet Take by mouth.    . latanoprost (XALATAN) 0.005 % ophthalmic solution Place 1 drop into both eyes at bedtime.    . magnesium oxide (MAG-OX) 400 (241.3 Mg) MG tablet   3  . metoprolol succinate (TOPROL-XL) 25 MG 24 hr tablet Take 25 mg by  mouth daily.    . nitrofurantoin, macrocrystal-monohydrate, (MACROBID) 100 MG capsule Take 1 capsule (100 mg total) by mouth every 12 (twelve) hours. 14 capsule 0  . nortriptyline (PAMELOR) 10 MG capsule   1  . omeprazole (PRILOSEC) 20 MG capsule Take 20 mg by mouth 2 (two) times daily before a meal.    . oxybutynin (DITROPAN) 5 MG tablet Take 1 tablet (5 mg total) by mouth every 8 (eight) hours as needed for bladder spasms. 90 tablet 3  . RHOPRESSA 0.02 % SOLN     . tiZANidine (ZANAFLEX) 2 MG tablet TAKE 1 TABLET BY MOUTH AT BEDTIME AS NEEDED.    . Travoprost, BAK Free, (TRAVATAN) 0.004 % SOLN ophthalmic solution     . triamcinolone cream (KENALOG) 0.1 % Apply topically.    . triamcinolone cream (KENALOG) 0.1 %      No current facility-administered medications for this visit.     Review of Systems Review of Systems  Constitutional: Negative.   Respiratory: Negative.   Cardiovascular: Negative.     Blood pressure (!) 183/83, pulse 99, temperature 97.7 F (36.5 C), temperature source Temporal, resp. rate 18, height 4\' 11"  (1.499 m), weight 159 lb 6.4 oz (72.3 kg), SpO2 97 %.  Physical Exam Physical Exam Exam conducted with a chaperone present.  Constitutional:      Appearance: She is well-developed.  Eyes:     General: No scleral icterus.    Conjunctiva/sclera: Conjunctivae normal.  Neck:     Musculoskeletal: Neck supple.  Cardiovascular:     Rate and Rhythm: Normal rate and regular rhythm.     Heart sounds: Normal heart sounds.  Pulmonary:     Effort: Pulmonary effort is normal.     Breath sounds: Normal breath sounds.  Chest:     Breasts:        Right: No inverted nipple, mass, nipple discharge, skin change or tenderness.        Left: No inverted nipple, mass, nipple discharge, skin change or tenderness.       Comments: Left breast cyst Lymphadenopathy:     Cervical: No cervical adenopathy.     Upper Body:     Right upper body: No supraclavicular or axillary  adenopathy.     Left upper body: No supraclavicular or axillary adenopathy.  Skin:    General: Skin is warm and dry.  Neurological:     Mental Status: She is alert and oriented to person, place, and time.  Psychiatric:  Behavior: Behavior normal.     Data Reviewed Procedure for cyst excision was reviewed and amenable to the patient.  The area was cleansed with ChloraPrep followed by 10 cc of 0.5% Xylocaine with 0.25% Marcaine with 1-200,000's of epinephrine.  The area was recleansed with ChloraPrep and draped.  Through an elliptical incision the cyst was excised including the central pore.  Scant bleeding was noted.  The wound was cleaned with peroxide followed by closure with a horizontal mattress suture of 4-0 nylon.  Dry dressing with Telfa and Tegaderm applied. Ice pack provided.  Postoperative wound care reviewed including management of the bandage.  She will return in 1 week for suture removal.  Assessment Inflamed sebaceous cyst of the left breast, excised.  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  Plan  The patient is aware to call back for any questions or new concerns.  When seen in January 2018 plans were for a follow-up mammogram in 1 year with her PCP.  We will arrange for this in about 6 weeks after healing of the sebaceous cyst area and final visit at that time. HPI, assessment, plan and physical exam has been scribed under the direction and in the presence of Robert Bellow, MD. Karie Fetch, RN  I have completed the exam and reviewed the above documentation for accuracy and completeness.  I agree with the above.  Haematologist has been used and any errors in dictation or transcription are unintentional.  Hervey Ard, M.D., F.A.C.S.  Forest Gleason Kaisen Ackers 03/06/2019, 2:38 PM

## 2019-03-06 ENCOUNTER — Other Ambulatory Visit: Payer: Self-pay | Admitting: Physician Assistant

## 2019-03-06 DIAGNOSIS — N6082 Other benign mammary dysplasias of left breast: Secondary | ICD-10-CM | POA: Insufficient documentation

## 2019-03-06 DIAGNOSIS — N6322 Unspecified lump in the left breast, upper inner quadrant: Secondary | ICD-10-CM

## 2019-03-07 NOTE — Progress Notes (Signed)
03/08/2019 2:56 PM   Cyann Alvin Critchley 09-Feb-1941 [id number]  Referring provider: Glendon Axe, [facility] [place],  [place] [address]  No chief complaint on file.   HPI:  Patient is a 78 year old Caucasian female with nephrolithiasis, urge incontinence, rUTI's and vaginal atrophy who presents today for follow up.    Nephrolithiasis CT Renal stone study performed on 08/26/2015 noted a bilateral nonobstructive nephrolithiasis. No CT evidence for obstructive uropathy.  Scattered cysts within the bilateral kidneys, with 2 small hyperdense lesions within the left kidney as above. These are not significantly changed relative to prior CT from 08/01/2013, and likely reflect small proteinaceous or hemorrhagic cysts given their relative stability.  No other acute intra-abdominal or pelvic process identified.  Status post cholecystectomy.    CT Renal study performed on 10/21/2016 noted bilateral nephrolithiasis, without obstructive uropathy.  Possible constipation.  Multiple renal lesions which are likely cysts and complex cysts, although these are incompletely characterized on this noncontrast.   Exam, relative stability favors benign or indolent lesions.  Hepatic steatosis.  Aortic atherosclerosis   RUS on 05/30/2018 revealed multiple BILATERAL renal cysts as noted on prior imaging.  Non-obstructing 7 mm calculus in an UPPER pole calyx of the RIGHT kidney. Previously identified LEFT renal calculi are not visible on today's ultrasound.  Layering debris in the urinary bladder, possibly indicating infection.  Patinet has underwent a TURBT/RTG's on 12/11/202019.  Pathology was benign.   She has not had any recent flank pain, gross hematuria or passage of fragments.    Urge incontinence/bladder spasms The patient is  experiencing urgency x 0-3, frequency x 0-3, not restricting fluids to avoid visits to the restroom, is engaging in toilet mapping, incontinence x 0-3 and  nocturia x 0-3.   Her BP is 158/86.   Her PVR is 194 mL.  Patient asked to empty her bladder and PVR was 27 mL.  She will continue the oxybutynin IR 5 mg TID.     History of rUTI's Risk factors: age, vaginal atrophy, incontinence.  Upper tract imaging above.  Underwent TURBT/RTG's on 08/29/2018.  Pathology was benign.    Vaginal atrophy Not a candidate for vaginal estrogen cream as they are following a breast papilloma.   She recently had a inflamed sebaceous cyst of the left breast removed on 03/05/2019.  She will need to have a follow up mammogram.    PMH: Past Medical History:  Diagnosis Date  . Arthritis   . Asthma   . Bell palsy 1973  . Bell's palsy   . Depression   . Fibromyalgia   . GERD (gastroesophageal reflux disease)   . Head ache   . Hiatal hernia   . History of kidney stones   . Hypercalcemia   . Hypertension   . Kidney stones   . Optic nerve disease     Surgical History: Past Surgical History:  Procedure Laterality Date  . ABDOMINAL HYSTERECTOMY  1982  . BREAST BIOPSY Right 03/04/2016   US biopsy/ INTRADUCTAL PAPILLOMA  . BREAST BIOPSY Left 03/04/2016   Fibroadenoma  . CARPAL TUNNEL RELEASE Bilateral 2012  . CATARACT EXTRACTION Bilateral 2017  . CHOLECYSTECTOMY  2001  . CYSTOSCOPY W/ RETROGRADES Bilateral 08/29/2018   Procedure: CYSTOSCOPY WITH RETROGRADE PYELOGRAM;  Surgeon: Hollice Espy, MD;  Location: [facility] ORS;  Service: Urology;  Laterality: Bilateral;  . CYSTOSCOPY WITH BIOPSY N/A 08/29/2018   Procedure: CYSTOSCOPY WITH Bladder BIOPSY;  Surgeon: Hollice Espy, MD;  Location:  [facility] ORS;  Service: Urology;  Laterality: N/A;  . KNEE SURGERY Left     Home Medications:  Allergies as of 03/08/2019      Reactions   Penicillins Shortness Of Breath   Patient states it "brings her asthma back on"   Duloxetine Other (See Comments)   nightmares   Iodine Itching   Ivp Dye [iodinated Diagnostic Agents]    Latanoprost    Tape    Tetracyclines & Related     Trusopt [dorzolamide Hcl]    Zocor [simvastatin]    Zovirax [acyclovir]    Macrobid [nitrofurantoin] Rash   Sulfa Antibiotics Rash      Medication List       Accurate as of March 08, 2019 11:59 PM. If you have any questions, ask your nurse or doctor.        STOP taking these medications   HYDROcodone-acetaminophen 5-325 MG tablet Commonly known as: NORCO/VICODIN Stopped by: Merissa Renwick, PA-C   nitrofurantoin (macrocrystal-monohydrate) 100 MG capsule Commonly known as: MACROBID Stopped by: Zara Council, PA-C     TAKE these medications   acetaminophen 325 MG tablet Commonly known as: TYLENOL Take 650 mg by mouth every 6 (six) hours as needed.   alendronate-cholecalciferol 70-2800 MG-UNIT tablet Commonly known as: FOSAMAX PLUS D Take 1 tablet by mouth every 7 (seven) days. Take with a full glass of water on an empty stomach.   brimonidine 0.2 % ophthalmic solution Commonly known as: ALPHAGAN   busPIRone 10 MG tablet Commonly known as: BUSPAR Take 10 mg by mouth 2 (two) times daily.   cetirizine 10 MG tablet Commonly known as: ZYRTEC Take 10 mg by mouth daily.   cholecalciferol 400 UNIT/ML Liqd Commonly known as: D-VI-SOL Take 400 Units by mouth daily.   cyanocobalamin 1000 MCG tablet Take 100 mcg by mouth daily.   diclofenac 1.3 % Ptch Commonly known as: FLECTOR Place 1 patch onto the skin 2 (two) times daily.   dorzolamide 2 % ophthalmic solution Commonly known as: TRUSOPT   DULoxetine 30 MG capsule Commonly known as: CYMBALTA Take by mouth.   enalapril 10 MG tablet Commonly known as: VASOTEC   fluticasone 50 MCG/ACT nasal spray Commonly known as: FLONASE Place 2 sprays into both nostrils daily.   hydrochlorothiazide 25 MG tablet Commonly known as: HYDRODIURIL Take 25 mg by mouth daily.   hyoscyamine 0.125 MG tablet Commonly known as: LEVSIN Take by mouth.   latanoprost 0.005 % ophthalmic solution Commonly known as: XALATAN Place 1  drop into both eyes at bedtime.   magnesium oxide 400 (241.3 Mg) MG tablet Commonly known as: MAG-OX   metoprolol succinate 25 MG 24 hr tablet Commonly known as: TOPROL-XL Take 25 mg by mouth daily.   nortriptyline 10 MG capsule Commonly known as: PAMELOR   omeprazole 20 MG capsule Commonly known as: PRILOSEC Take 20 mg by mouth 2 (two) times daily before a meal.   oxybutynin 5 MG tablet Commonly known as: DITROPAN Take 1 tablet (5 mg total) by mouth every 8 (eight) hours as needed for bladder spasms.   Rhopressa 0.02 % Soln Generic drug: Netarsudil Dimesylate   tiZANidine 2 MG tablet Commonly known as: ZANAFLEX TAKE 1 TABLET BY MOUTH AT BEDTIME AS NEEDED.   Travoprost (BAK Free) 0.004 % Soln ophthalmic solution Commonly known as: TRAVATAN   triamcinolone cream 0.1 % Commonly known as: KENALOG Apply topically.   triamcinolone cream 0.1 % Commonly known as: KENALOG       Allergies:  Allergies  Allergen Reactions  . Penicillins Shortness Of Breath    Patient states it "brings her asthma back on"  . Duloxetine Other (See Comments)    nightmares  . Iodine Itching  . Ivp Dye [Iodinated Diagnostic Agents]   . Latanoprost   . Tape   . Tetracyclines & Related   . Trusopt [Dorzolamide Hcl]   . Zocor [Simvastatin]   . Zovirax [Acyclovir]   . Macrobid [Nitrofurantoin] Rash  . Sulfa Antibiotics Rash    Family History: Family History  Problem Relation Age of Onset  . Leukemia Mother   . Diabetes Father   . Breast cancer Sister   . Brain cancer Brother   . Diabetes Sister   . Diabetes Brother   . Prostate cancer Neg Hx   . Kidney cancer Neg Hx   . Bladder Cancer Neg Hx     Social History:  reports that she has never smoked. She has never used smokeless tobacco. She reports that she does not drink alcohol or use drugs.  ROS: UROLOGY Frequent Urination?: No Hard to postpone urination?: No Burning/pain with urination?: Yes Get up at night to urinate?: No  Leakage of urine?: No Urine stream starts and stops?: No Trouble starting stream?: No Do you have to strain to urinate?: No Blood in urine?: No Urinary tract infection?: No Sexually transmitted disease?: No Injury to kidneys or bladder?: No Painful intercourse?: Yes Weak stream?: No Currently pregnant?: No Vaginal bleeding?: No Last menstrual period?: N  Gastrointestinal Nausea?: No Indigestion/heartburn?: No Diarrhea?: No Constipation?: No  Constitutional Fever: No Night sweats?: No Weight loss?: No Fatigue?: No  Skin Skin rash/lesions?: No Itching?: No  Eyes Blurred vision?: No Double vision?: No  Ears/Nose/Throat Sore throat?: No Sinus problems?: No  Hematologic/Lymphatic Swollen glands?: No Easy bruising?: No  Cardiovascular Leg swelling?: No Chest pain?: No  Respiratory Cough?: No Shortness of breath?: No  Endocrine Excessive thirst?: No  Musculoskeletal Back pain?: No Joint pain?: No  Neurological Headaches?: No Dizziness?: No  Psychologic Depression?: No Anxiety?: No  Physical Exam: BP (!) 158/86   Pulse 92   Ht 4\' 11"  (1.499 m)   Wt 161 lb (73 kg)   BMI 32.52 kg/m   Constitutional:  Well nourished. Alert and oriented, No acute distress. HEENT: [place] AT, moist mucus membranes.  Trachea midline, no masses. Cardiovascular: No clubbing, cyanosis, or edema. Respiratory: Normal respiratory effort, no increased work of breathing. Neurologic: Grossly intact, no focal deficits, moving all 4 extremities. Psychiatric: Normal mood and affect.   Laboratory Data: Lab Results  Component Value Date   WBC 9.3 08/23/2018   HGB 13.0 08/23/2018   HCT 39.8 08/23/2018   MCV 92.8 08/23/2018   PLT 289 08/23/2018    Lab Results  Component Value Date   CREATININE 0.94 08/23/2018    Lab Results  Component Value Date   AST 21 08/26/2015   Lab Results  Component Value Date   ALT 14 08/26/2015   I have reviewed the labs.   Assessment &  Plan:    1. Vaginal atrophy Continue Replens and A & D for discomfort - given Lidocaine jelly to use a pea sized amount to the external vaginal canal once daily for the burning -If breast papilloma resolves will consider low dose vaginal estrogen cream - follow up after mammogram   2. Bladder spasms Continue the oxybutynin 5 mg TID  Return for follow up after mammogram .  These notes generated with voice recognition software. I  apologize for typographical errors.  Zara Council, PA-C  [facility] [address] [place], [place] [address] [phone]

## 2019-03-08 ENCOUNTER — Other Ambulatory Visit: Payer: Self-pay

## 2019-03-08 ENCOUNTER — Ambulatory Visit: Payer: Medicare Other | Admitting: Urology

## 2019-03-08 ENCOUNTER — Encounter: Payer: Self-pay | Admitting: Urology

## 2019-03-08 VITALS — BP 158/86 | HR 92 | Ht 59.0 in | Wt 161.0 lb

## 2019-03-08 DIAGNOSIS — N3289 Other specified disorders of bladder: Secondary | ICD-10-CM

## 2019-03-08 DIAGNOSIS — N952 Postmenopausal atrophic vaginitis: Secondary | ICD-10-CM

## 2019-03-08 LAB — BLADDER SCAN AMB NON-IMAGING

## 2019-03-11 ENCOUNTER — Ambulatory Visit: Payer: Medicare Other | Admitting: Urology

## 2019-03-12 ENCOUNTER — Other Ambulatory Visit: Payer: Self-pay

## 2019-03-12 ENCOUNTER — Ambulatory Visit: Payer: Medicare Other | Admitting: *Deleted

## 2019-03-12 DIAGNOSIS — N6082 Other benign mammary dysplasias of left breast: Secondary | ICD-10-CM

## 2019-03-12 NOTE — Progress Notes (Signed)
Patient came in today for a wound check.  The wound is clean, with no signs of infection noted. The sutures were removed . Follow up as scheduled.

## 2019-03-14 ENCOUNTER — Telehealth: Payer: Self-pay | Admitting: Urology

## 2019-03-14 NOTE — Telephone Encounter (Signed)
Pt called and started taking AZO and wants to know if she can take it with all of her other meds. Please advise.

## 2019-03-15 NOTE — Telephone Encounter (Signed)
Spoke to patient and she states she has not been using the Replens or A and D or using the Oxybutynin. I informed her she needs to be using all of these medications that Larene Beach gave her to help with her symptoms. Patient voiced understanding and will call on Monday if she is not doing better.

## 2019-03-20 ENCOUNTER — Telehealth: Payer: Self-pay | Admitting: *Deleted

## 2019-03-20 NOTE — Telephone Encounter (Signed)
Left message for patient to call back regarding results.

## 2019-03-29 ENCOUNTER — Other Ambulatory Visit: Payer: Self-pay

## 2019-03-29 ENCOUNTER — Ambulatory Visit
Admission: RE | Admit: 2019-03-29 | Discharge: 2019-03-29 | Disposition: A | Discharge status: Home / Self Care | Payer: Medicare Other | Source: Ambulatory Visit | Attending: Physician Assistant | Admitting: Physician Assistant

## 2019-03-29 DIAGNOSIS — N6322 Unspecified lump in the left breast, upper inner quadrant: Secondary | ICD-10-CM | POA: Diagnosis not present

## 2019-04-02 ENCOUNTER — Ambulatory Visit (INDEPENDENT_AMBULATORY_CARE_PROVIDER_SITE_OTHER): Payer: Medicare Other | Admitting: General Surgery

## 2019-04-02 ENCOUNTER — Other Ambulatory Visit: Payer: Self-pay | Admitting: *Deleted

## 2019-04-02 ENCOUNTER — Other Ambulatory Visit: Payer: Self-pay

## 2019-04-02 ENCOUNTER — Encounter: Payer: Self-pay | Admitting: General Surgery

## 2019-04-02 VITALS — BP 178/85 | HR 121 | Temp 97.5°F | Ht 59.0 in | Wt 157.0 lb

## 2019-04-02 DIAGNOSIS — Z1231 Encounter for screening mammogram for malignant neoplasm of breast: Secondary | ICD-10-CM

## 2019-04-02 DIAGNOSIS — Z87440 Personal history of urinary (tract) infections: Secondary | ICD-10-CM

## 2019-04-02 NOTE — Patient Instructions (Signed)
Turn as needed.The patient is aware to call back for any questions or concerns.

## 2019-04-02 NOTE — Progress Notes (Signed)
Patient ID: Anne Steele, female   DOB: [birth year], 78 y.o.   MRN: [id number]  Chief Complaint  Patient presents with  . Follow-up    HPI Anne Steele is a 78 y.o. female who presents for a breast evaluation. The most recent mammogram was done on 03/29/19. Patient does perform regular self breast checks and gets regular mammograms done.  Patient states she has some soreness in her right side. She has been working in the yard.    Past Medical History:  Diagnosis Date  . Arthritis   . Asthma   . Bell palsy 1973  . Bell's palsy   . Depression   . Fibromyalgia   . GERD (gastroesophageal reflux disease)   . Anne Steele   . Hiatal hernia   . History of kidney stones   . Hypercalcemia   . Hypertension   . Kidney stones   . Optic nerve disease     Past Surgical History:  Procedure Laterality Date  . ABDOMINAL HYSTERECTOMY  1982  . BREAST BIOPSY Right 03/04/2016   US biopsy/ INTRADUCTAL PAPILLOMA  . BREAST BIOPSY Left 03/04/2016   Fibroadenoma  . BREAST EXCISIONAL BIOPSY Left 03/05/2019   Inflamed sebaceous cyst of the left breast, excised by dr. Bary Castilla in office BENIGN EPIDERMAL INCLUSION CYST WITH POSSIBLE RUPTURE AND ASSOCIATED INFLAMMATORY RESPONSE  . CARPAL TUNNEL RELEASE Bilateral 2012  . CATARACT EXTRACTION Bilateral 2017  . CHOLECYSTECTOMY  2001  . CYSTOSCOPY W/ RETROGRADES Bilateral 08/29/2018   Procedure: CYSTOSCOPY WITH RETROGRADE PYELOGRAM;  Surgeon: Hollice Espy, MD;  Location: [facility] ORS;  Service: Urology;  Laterality: Bilateral;  . CYSTOSCOPY WITH BIOPSY N/A 08/29/2018   Procedure: CYSTOSCOPY WITH Bladder BIOPSY;  Surgeon: Hollice Espy, MD;  Location: [facility] ORS;  Service: Urology;  Laterality: N/A;  . KNEE SURGERY Left     Family History  Problem Relation Age of Onset  . Leukemia Mother   . Diabetes Father   . Breast cancer Sister   . Brain cancer Brother   . Diabetes Sister   . Diabetes Brother   . Prostate cancer Neg Hx   . Kidney cancer  Neg Hx   . Bladder Cancer Neg Hx     Social History Social History   Tobacco Use  . Smoking status: Never Smoker  . Smokeless tobacco: Never Used  Substance Use Topics  . Alcohol use: No    Alcohol/week: 0.0 standard drinks  . Drug use: No    Allergies  Allergen Reactions  . Penicillins Shortness Of Breath    Patient states it "brings her asthma back on"  . Duloxetine Other (See Comments)    nightmares  . Iodine Itching  . Ivp Dye [Iodinated Diagnostic Agents]   . Latanoprost   . Tape   . Tetracyclines & Related   . Trusopt [Dorzolamide Hcl]   . Zocor [Simvastatin]   . Zovirax [Acyclovir]   . Macrobid [Nitrofurantoin] Rash  . Sulfa Antibiotics Rash    Current Outpatient Medications  Medication Sig Dispense Refill  . acetaminophen (TYLENOL) 325 MG tablet Take 650 mg by mouth every 6 (six) hours as needed.    Marland Kitchen alendronate-cholecalciferol (FOSAMAX PLUS D) 70-2800 MG-UNIT tablet Take 1 tablet by mouth every 7 (seven) days. Take with a full glass of water on an empty stomach.    . brimonidine (ALPHAGAN) 0.2 % ophthalmic solution   4  . busPIRone (BUSPAR) 10 MG tablet Take 10 mg by mouth 2 (two) times daily.    Marland Kitchen  cetirizine (ZYRTEC) 10 MG tablet Take 10 mg by mouth daily.    . cholecalciferol (D-VI-SOL) 400 UNIT/ML LIQD Take 400 Units by mouth daily.    . cyanocobalamin 1000 MCG tablet Take 100 mcg by mouth daily.    . diclofenac (FLECTOR) 1.3 % PTCH Place 1 patch onto the skin 2 (two) times daily.    . dorzolamide (TRUSOPT) 2 % ophthalmic solution   5  . DULoxetine (CYMBALTA) 30 MG capsule Take by mouth.    . enalapril (VASOTEC) 10 MG tablet   2  . fluticasone (FLONASE) 50 MCG/ACT nasal spray Place 2 sprays into both nostrils daily.    . hydrochlorothiazide (HYDRODIURIL) 25 MG tablet Take 25 mg by mouth daily.    . hyoscyamine (LEVSIN, ANASPAZ) 0.125 MG tablet Take by mouth.    . latanoprost (XALATAN) 0.005 % ophthalmic solution Place 1 drop into both eyes at bedtime.     . magnesium oxide (MAG-OX) 400 (241.3 Mg) MG tablet   3  . metoprolol succinate (TOPROL-XL) 25 MG 24 hr tablet Take 25 mg by mouth daily.    . nortriptyline (PAMELOR) 10 MG capsule   1  . omeprazole (PRILOSEC) 20 MG capsule Take 20 mg by mouth 2 (two) times daily before a meal.    . oxybutynin (DITROPAN) 5 MG tablet Take 1 tablet (5 mg total) by mouth every 8 (eight) hours as needed for bladder spasms. 90 tablet 3  . RHOPRESSA 0.02 % SOLN     . tiZANidine (ZANAFLEX) 2 MG tablet TAKE 1 TABLET BY MOUTH AT BEDTIME AS NEEDED.    . Travoprost, BAK Free, (TRAVATAN) 0.004 % SOLN ophthalmic solution     . triamcinolone cream (KENALOG) 0.1 % Apply topically.    . triamcinolone cream (KENALOG) 0.1 %      No current facility-administered medications for this visit.     Review of Systems Review of Systems  Constitutional: Negative.   Respiratory: Negative.   Cardiovascular: Negative.     Blood pressure (!) 178/85, pulse (!) 121, temperature (!) 97.5 F (36.4 C), temperature source Skin, height 4\' 11"  (1.499 m), weight 157 lb (71.2 kg), SpO2 94 %.  Physical Exam Physical Exam Exam conducted with a chaperone present.  Constitutional:      Appearance: Normal appearance.  Neck:     Musculoskeletal: Normal range of motion and neck supple.  Cardiovascular:     Rate and Rhythm: Normal rate and regular rhythm.  Pulmonary:     Effort: Pulmonary effort is normal.     Breath sounds: Normal breath sounds.  Chest:     Breasts:        Right: Normal.        Left: Normal.  Lymphadenopathy:     Upper Body:     Right upper body: No supraclavicular or axillary adenopathy.     Left upper body: No supraclavicular or axillary adenopathy.  Skin:    General: Skin is warm and dry.  Neurological:     General: No focal deficit present.     Mental Status: She is alert and oriented to person, place, and time.     Data Reviewed Bilateral diagnostic mammogram in March 29, 2019 was independently reviewed.   No interval change.  BI-RADS-1.  January 2018 breast ultrasound showed 3 mm residual densities at site of previous papilloma and fibroadenoma.  Assessment Stable breast exam.  Plan The patient should continue annual screening mammograms with her PCP.  Return as needed The patient is  aware to call back for any questions or concerns.    HPI, Physical Exam, Assessment and Plan have been scribed under the direction and in the presence of Hervey Ard, MD.  Gaspar Cola, CMA  I have completed the exam and reviewed the above documentation for accuracy and completeness.  I agree with the above.  Haematologist has been used and any errors in dictation or transcription are unintentional.  Hervey Ard, M.D., F.A.C.S.  Forest Gleason Debbora Ang 04/02/2019, 3:06 PM

## 2019-04-05 ENCOUNTER — Other Ambulatory Visit
Admission: RE | Admit: 2019-04-05 | Discharge: 2019-04-05 | Disposition: A | Discharge status: Home / Self Care | Payer: Medicare Other | Attending: Urology | Admitting: Urology

## 2019-04-05 ENCOUNTER — Encounter: Payer: Self-pay | Admitting: Urology

## 2019-04-05 ENCOUNTER — Ambulatory Visit (INDEPENDENT_AMBULATORY_CARE_PROVIDER_SITE_OTHER): Payer: Medicare Other | Admitting: Urology

## 2019-04-05 ENCOUNTER — Ambulatory Visit: Payer: Medicare Other | Admitting: Urology

## 2019-04-05 ENCOUNTER — Other Ambulatory Visit: Payer: Self-pay

## 2019-04-05 VITALS — BP 132/67 | HR 91 | Ht 59.0 in | Wt 157.0 lb

## 2019-04-05 DIAGNOSIS — R300 Dysuria: Secondary | ICD-10-CM

## 2019-04-05 DIAGNOSIS — N3941 Urge incontinence: Secondary | ICD-10-CM | POA: Diagnosis present

## 2019-04-05 DIAGNOSIS — Z87440 Personal history of urinary (tract) infections: Secondary | ICD-10-CM | POA: Diagnosis present

## 2019-04-05 LAB — URINALYSIS, COMPLETE (UACMP) WITH MICROSCOPIC
Bilirubin Urine: NEGATIVE
Glucose, UA: NEGATIVE mg/dL
Hgb urine dipstick: NEGATIVE
Ketones, ur: NEGATIVE mg/dL
Nitrite: NEGATIVE
Protein, ur: NEGATIVE mg/dL
RBC / HPF: NONE SEEN RBC/hpf (ref 0–5)
Specific Gravity, Urine: 1.01 (ref 1.005–1.030)
pH: 6 (ref 5.0–8.0)

## 2019-04-05 LAB — BLADDER SCAN AMB NON-IMAGING

## 2019-04-05 NOTE — Progress Notes (Signed)
04/05/2019 1:31 PM   Anne Steele 02/08/1941 [id number]  Referring provider: Alfonse Flavors, MD [address] [place],  [place] [address]  Chief Complaint  Patient presents with  . Dysuria    HPI: 78 year old female with chronic dysuria who returns today for follow-up.  She has been using a and D ointment and Replens for vaginal moisture.  We avoided estrogen as she was being worked up for possible breast cancer although her work-up has been negative.   She reports that she has constant pain in her urethral/vaginal area which sometimes is relieved with voiding.  No alleviating or exacerbating factors.  This is a chronic issue although she reports it only started after her bladder biopsy a year ago.  She continues to have urgency, frequency, and dysuria.  She reports that she dropped off a urine by her primary care physician earlier this week and was told today that she has a UTI.  She was prescribed Cipro but has not yet started taking this.  Urinalysis on 04/03/2019 showed 4-10 white blood cells, small leukocyte esterase and moderate bacteria.  No squamous epithelials were seen.  No blood.  Urine culture was not sent.   PMH: Past Medical History:  Diagnosis Date  . Arthritis   . Asthma   . Bell palsy 1973  . Bell's palsy   . Depression   . Fibromyalgia   . GERD (gastroesophageal reflux disease)   . Head ache   . Hiatal hernia   . History of kidney stones   . Hypercalcemia   . Hypertension   . Kidney stones   . Optic nerve disease     Surgical History: Past Surgical History:  Procedure Laterality Date  . ABDOMINAL HYSTERECTOMY  1982  . BREAST BIOPSY Right 03/04/2016   US biopsy/ INTRADUCTAL PAPILLOMA  . BREAST BIOPSY Left 03/04/2016   Fibroadenoma  . BREAST EXCISIONAL BIOPSY Left 03/05/2019   Inflamed sebaceous cyst of the left breast, excised by dr. Bary Castilla in office BENIGN EPIDERMAL INCLUSION CYST WITH POSSIBLE RUPTURE AND ASSOCIATED INFLAMMATORY  RESPONSE  . CARPAL TUNNEL RELEASE Bilateral 2012  . CATARACT EXTRACTION Bilateral 2017  . CHOLECYSTECTOMY  2001  . CYSTOSCOPY W/ RETROGRADES Bilateral 08/29/2018   Procedure: CYSTOSCOPY WITH RETROGRADE PYELOGRAM;  Surgeon: Hollice Espy, MD;  Location: [facility];  Service: Urology;  Laterality: Bilateral;  . CYSTOSCOPY WITH BIOPSY N/A 08/29/2018   Procedure: CYSTOSCOPY WITH Bladder BIOPSY;  Surgeon: Hollice Espy, MD;  Location: [facility];  Service: Urology;  Laterality: N/A;  . KNEE SURGERY Left     Home Medications:  Allergies as of 04/05/2019      Reactions   Penicillins Shortness Of Breath   Patient states it "brings her asthma back on"   Duloxetine Other (See Comments)   nightmares   Iodine Itching   Ivp Dye [iodinated Diagnostic Agents]    Latanoprost    Tape    Tetracyclines & Related    Trusopt [dorzolamide Hcl]    Zocor [simvastatin]    Zovirax [acyclovir]    Macrobid [nitrofurantoin] Rash   Sulfa Antibiotics Rash      Medication List       Accurate as of April 05, 2019  1:31 PM. If you have any questions, ask your nurse or doctor.        STOP taking these medications   nortriptyline 10 MG capsule Commonly known as: PAMELOR Stopped by: Hollice Espy, MD   tiZANidine 2 MG tablet Commonly known as: ZANAFLEX  Stopped by: Hollice Espy, MD     TAKE these medications   acetaminophen 325 MG tablet Commonly known as: TYLENOL Take 650 mg by mouth every 6 (six) hours as needed.   alendronate-cholecalciferol 70-2800 MG-UNIT tablet Commonly known as: FOSAMAX PLUS D Take 1 tablet by mouth every 7 (seven) days. Take with a full glass of water on an empty stomach.   brimonidine 0.2 % ophthalmic solution Commonly known as: ALPHAGAN   busPIRone 10 MG tablet Commonly known as: BUSPAR Take 10 mg by mouth 2 (two) times daily.   cetirizine 10 MG tablet Commonly known as: ZYRTEC Take 10 mg by mouth daily.   cholecalciferol 400 UNIT/ML Liqd Commonly known as:  D-VI-SOL Take 400 Units by mouth daily.   ciprofloxacin 250 MG tablet Commonly known as: CIPRO Take by mouth.   cyanocobalamin 1000 MCG tablet Take 100 mcg by mouth daily.   diclofenac 1.3 % Ptch Commonly known as: FLECTOR Place 1 patch onto the skin 2 (two) times daily.   dorzolamide 2 % ophthalmic solution Commonly known as: TRUSOPT   DULoxetine 30 MG capsule Commonly known as: CYMBALTA Take by mouth.   enalapril 10 MG tablet Commonly known as: VASOTEC   fluticasone 50 MCG/ACT nasal spray Commonly known as: FLONASE Place 2 sprays into both nostrils daily.   hydrochlorothiazide 25 MG tablet Commonly known as: HYDRODIURIL Take 25 mg by mouth daily.   hyoscyamine 0.125 MG tablet Commonly known as: LEVSIN Take by mouth.   latanoprost 0.005 % ophthalmic solution Commonly known as: XALATAN Place 1 drop into both eyes at bedtime.   magnesium oxide 400 (241.3 Mg) MG tablet Commonly known as: MAG-OX   metoprolol succinate 25 MG 24 hr tablet Commonly known as: TOPROL-XL Take 25 mg by mouth daily.   omeprazole 20 MG capsule Commonly known as: PRILOSEC Take 20 mg by mouth 2 (two) times daily before a meal.   oxybutynin 5 MG tablet Commonly known as: DITROPAN Take 1 tablet (5 mg total) by mouth every 8 (eight) hours as needed for bladder spasms.   Rhopressa 0.02 % Soln Generic drug: Netarsudil Dimesylate   Travoprost (BAK Free) 0.004 % Soln ophthalmic solution Commonly known as: TRAVATAN   triamcinolone cream 0.1 % Commonly known as: KENALOG       Allergies:  Allergies  Allergen Reactions  . Penicillins Shortness Of Breath    Patient states it "brings her asthma back on"  . Duloxetine Other (See Comments)    nightmares  . Iodine Itching  . Ivp Dye [Iodinated Diagnostic Agents]   . Latanoprost   . Tape   . Tetracyclines & Related   . Trusopt [Dorzolamide Hcl]   . Zocor [Simvastatin]   . Zovirax [Acyclovir]   . Macrobid [Nitrofurantoin] Rash  .  Sulfa Antibiotics Rash    Family History: Family History  Problem Relation Age of Onset  . Leukemia Mother   . Diabetes Father   . Breast cancer Sister   . Brain cancer Brother   . Diabetes Sister   . Diabetes Brother   . Prostate cancer Neg Hx   . Kidney cancer Neg Hx   . Bladder Cancer Neg Hx     Social History:  reports that she has never smoked. She has never used smokeless tobacco. She reports that she does not drink alcohol or use drugs.  ROS: UROLOGY Frequent Urination?: No Hard to postpone urination?: No Burning/pain with urination?: Yes Get up at night to urinate?: No Leakage of urine?:  No Urine stream starts and stops?: No Trouble starting stream?: No Do you have to strain to urinate?: No Blood in urine?: No Urinary tract infection?: No Sexually transmitted disease?: No Injury to kidneys or bladder?: No Painful intercourse?: No Weak stream?: No Currently pregnant?: No Vaginal bleeding?: No Last menstrual period?: n  Gastrointestinal Nausea?: No Vomiting?: No Indigestion/heartburn?: No Diarrhea?: No Constipation?: No  Constitutional Fever: No Night sweats?: No Weight loss?: No Fatigue?: No  Skin Skin rash/lesions?: No Itching?: No  Eyes Blurred vision?: No Double vision?: No  Ears/Nose/Throat Sore throat?: No Sinus problems?: No  Hematologic/Lymphatic Swollen glands?: No Easy bruising?: No  Cardiovascular Leg swelling?: No Chest pain?: No  Respiratory Cough?: No Shortness of breath?: No  Endocrine Excessive thirst?: No  Musculoskeletal Back pain?: No Joint pain?: No  Neurological Headaches?: No Dizziness?: No  Psychologic Depression?: No Anxiety?: No  Physical Exam: BP 132/67   Pulse 91   Ht 4\' 11"  (1.499 m)   Wt 157 lb (71.2 kg)   BMI 31.71 kg/m   Constitutional:  Alert and oriented, No acute distress. HEENT: [place] AT, moist mucus membranes.  Trachea midline, no masses. Cardiovascular: No clubbing, cyanosis, or  edema. Respiratory: Normal respiratory effort, no increased work of breathing. Skin: No rashes, bruises or suspicious lesions. Neurologic: Grossly intact, no focal deficits, moving all 4 extremities. Psychiatric: Normal mood and affect.  Laboratory Data: Lab Results  Component Value Date   WBC 9.3 08/23/2018   HGB 13.0 08/23/2018   HCT 39.8 08/23/2018   MCV 92.8 08/23/2018   PLT 289 08/23/2018    Lab Results  Component Value Date   CREATININE 0.94 08/23/2018    Results for orders placed or performed in visit on 04/05/19  Bladder Scan (Post Void Residual) in office  Result Value Ref Range   Scan Result 30ml     Assessment & Plan:    1. Dysuria Discussed today that this is a chronic issue, longstanding documentation of this well prior to cystoscopy/bladder biopsy with benign pathology.  Suspect this is related to external atrophy.  She was diagnosed with a "UTI" by her primary care physician 2 days ago and has not yet started the antibiotic.  Her UA today appears fairly bland.  I do not suspect infection.  I like her to hold off on taking antibiotics until her culture results.  I have offered her repeat cystoscopy in the office to reevaluate in light of her complaints today.  She is agreeable this plan and return next week for this.  2. Urge incontinence Adequate bladder emptying Continue oxybutynin/Levsin - Bladder Scan (Post Void Residual) in office - Urine culture; Future  Return next week for cystoscopy  Hollice Espy, MD  [facility] [address], [place], [place] [address] [phone]

## 2019-04-06 LAB — URINE CULTURE: Culture: >=10000 — AB

## 2019-04-09 ENCOUNTER — Other Ambulatory Visit: Payer: Self-pay | Admitting: Urology

## 2019-04-09 ENCOUNTER — Other Ambulatory Visit: Payer: Self-pay

## 2019-04-09 DIAGNOSIS — Z87440 Personal history of urinary (tract) infections: Secondary | ICD-10-CM

## 2019-04-10 ENCOUNTER — Telehealth: Payer: Self-pay | Admitting: Urology

## 2019-04-10 NOTE — Telephone Encounter (Signed)
Pt called and would like her UA results. Please advise.

## 2019-04-10 NOTE — Telephone Encounter (Signed)
No infection, looks good  Anne Espy, MD

## 2019-04-10 NOTE — Telephone Encounter (Signed)
Left Vm to return call with any questions.

## 2019-04-10 NOTE — Telephone Encounter (Signed)
Please review and advise.

## 2019-04-12 ENCOUNTER — Ambulatory Visit (INDEPENDENT_AMBULATORY_CARE_PROVIDER_SITE_OTHER): Payer: Medicare Other | Admitting: Urology

## 2019-04-12 ENCOUNTER — Encounter: Payer: Self-pay | Admitting: Urology

## 2019-04-12 ENCOUNTER — Other Ambulatory Visit: Payer: Self-pay

## 2019-04-12 VITALS — BP 136/69 | HR 87 | Ht 59.0 in | Wt 157.0 lb

## 2019-04-12 DIAGNOSIS — R300 Dysuria: Secondary | ICD-10-CM | POA: Diagnosis not present

## 2019-04-12 DIAGNOSIS — N949 Unspecified condition associated with female genital organs and menstrual cycle: Secondary | ICD-10-CM

## 2019-04-12 DIAGNOSIS — N952 Postmenopausal atrophic vaginitis: Secondary | ICD-10-CM

## 2019-04-12 MED ORDER — PREMARIN 0.625 MG/GM VA CREA: 1.0000 | TOPICAL_CREAM | Freq: Every day | VAGINAL | 12 refills | Status: DC

## 2019-04-12 NOTE — Progress Notes (Signed)
   04/12/19  CC:  Chief Complaint  Patient presents with  . Cysto    HPI: 78 year old female with refractory dysuria/vaginal burning who presents today for repeat cystoscopy.  Please see note for previous evaluation.   Blood pressure 136/69, pulse 87, height 4\' 11"  (1.499 m), weight 157 lb (71.2 kg). NED. A&Ox3.   No respiratory distress   Abd soft, NT, ND Normal external genitalia with patent urethral meatus  Cystoscopy Procedure Note  Patient identification was confirmed, informed consent was obtained, and patient was prepped using Betadine solution.  Lidocaine jelly was administered per urethral meatus.    Procedure: - Flexible cystoscope introduced, without any difficulty.   - Thorough search of the bladder revealed:    normal urethral meatus    normal urothelium    no stones    no ulcers     no tumors    no urethral polyps    no trabeculation  - Ureteral orifices were normal in position and appearance.  Post-Procedure: - Patient tolerated the procedure well  Assessment/ Plan:  1. Dysuria Etiology unclear, cystoscopy today is completely unremarkable Previous bladder biopsies negative for malignancy  2. Vaginal burning Chronic, improving with Replens Given that her oncologic work-up for breast cancer is negative, we did discuss pursuing topical estrogen cream, pea-sized amount 3 days/week on Monday Wednesday Friday Agreeable this plan  3. Vaginal atrophy As above   Follow-up in 6 months with Marvis Repress, MD

## 2019-10-14 ENCOUNTER — Ambulatory Visit: Payer: Medicare Other | Admitting: Urology

## 2019-10-21 ENCOUNTER — Ambulatory Visit: Payer: Medicare Other | Admitting: Urology

## 2019-11-14 ENCOUNTER — Ambulatory Visit: Payer: Medicare Other | Admitting: Urology

## 2019-11-14 ENCOUNTER — Other Ambulatory Visit: Payer: Self-pay

## 2019-11-14 ENCOUNTER — Encounter: Payer: Self-pay | Admitting: Urology

## 2019-11-14 VITALS — BP 170/91 | HR 116 | Ht 59.0 in | Wt 163.0 lb

## 2019-11-14 DIAGNOSIS — N952 Postmenopausal atrophic vaginitis: Secondary | ICD-10-CM | POA: Diagnosis not present

## 2019-11-14 DIAGNOSIS — B356 Tinea cruris: Secondary | ICD-10-CM | POA: Diagnosis not present

## 2019-11-14 DIAGNOSIS — R300 Dysuria: Secondary | ICD-10-CM

## 2019-11-14 LAB — URINALYSIS, COMPLETE
Bilirubin, UA: NEGATIVE
Glucose, UA: NEGATIVE
Ketones, UA: NEGATIVE
Nitrite, UA: NEGATIVE
Protein,UA: NEGATIVE
RBC, UA: NEGATIVE
Specific Gravity, UA: 1.01 (ref 1.005–1.030)
Urobilinogen, Ur: 0.2 mg/dL (ref 0.2–1.0)
pH, UA: 5 (ref 5.0–7.5)

## 2019-11-14 LAB — MICROSCOPIC EXAMINATION: RBC, Urine: NONE SEEN /hpf (ref 0–2)

## 2019-11-14 LAB — BLADDER SCAN AMB NON-IMAGING: Scan Result: 106

## 2019-11-14 MED ORDER — NYSTATIN-TRIAMCINOLONE 100000-0.1 UNIT/GM-% EX OINT: 1.0000 "application " | TOPICAL_OINTMENT | Freq: Two times a day (BID) | CUTANEOUS | 0 refills | Status: DC

## 2019-11-14 NOTE — Patient Instructions (Signed)
Apply the vaginal estrogen cream on Monday nights, Wednesday nights and Friday nights

## 2019-11-14 NOTE — Progress Notes (Signed)
11/14/2019 4:23 PM   Anne Steele [birth year] [id number]  Referring provider: Tracie Harrier, MD [address] [facility] [place],   [address]  Chief Complaint  Patient presents with  . Dysuria    HPI: Anne Steele is a 79 year old female with dysuria, urge incontinence and vaginal atrophy who presents today for possible UTI.    She states that she has had a rash in her groin area bilaterally, labia and around her rectum for the last 2 weeks.  She states that when she sits in the tub, the rash stings.  She has noticed a fluid and discharge from the rash.  She denies any change in soaps, lotions or laundry detergent.  She does wear depends for incontinence.    She had tried the vaginal estrogen cream, diaper rash ointment and Monistat cream on the rash.  She has noted the greatest improvement with the Monistat cream.    She is wondering if she has a urinary tract infection causing the rash.  UA yellow clear, specific gravity 1.010, pH 5, trace leukocytes, 6-10 WBCs, 0-10 epithelial cells and few bacteria.  PVR is 106 mL.    PMH: Past Medical History:  Diagnosis Date  . Arthritis   . Asthma   . Bell palsy 1973  . Bell's palsy   . Depression   . Fibromyalgia   . GERD (gastroesophageal reflux disease)   . Head ache   . Hiatal hernia   . History of kidney stones   . Hypercalcemia   . Hypertension   . Kidney stones   . Optic nerve disease     Surgical History: Past Surgical History:  Procedure Laterality Date  . ABDOMINAL HYSTERECTOMY  1982  . BREAST BIOPSY Right 03/04/2016   US biopsy/ INTRADUCTAL PAPILLOMA  . BREAST BIOPSY Left 03/04/2016   Fibroadenoma  . BREAST EXCISIONAL BIOPSY Left 03/05/2019   Inflamed sebaceous cyst of the left breast, excised by dr. Bary Castilla in office BENIGN EPIDERMAL INCLUSION CYST WITH POSSIBLE RUPTURE AND ASSOCIATED INFLAMMATORY RESPONSE  . CARPAL TUNNEL RELEASE Bilateral 2012  . CATARACT EXTRACTION Bilateral  2017  . CHOLECYSTECTOMY  2001  . CYSTOSCOPY W/ RETROGRADES Bilateral 08/29/2018   Procedure: CYSTOSCOPY WITH RETROGRADE PYELOGRAM;  Surgeon: Hollice Espy, MD;  Location: [facility];  Service: Urology;  Laterality: Bilateral;  . CYSTOSCOPY WITH BIOPSY N/A 08/29/2018   Procedure: CYSTOSCOPY WITH Bladder BIOPSY;  Surgeon: Hollice Espy, MD;  Location: [facility];  Service: Urology;  Laterality: N/A;  . KNEE SURGERY Left     Home Medications:  Allergies as of 11/14/2019      Reactions   Penicillins Shortness Of Breath   Patient states it "brings her asthma back on"   Duloxetine Other (See Comments)   nightmares   Iodine Itching   Ivp Dye [iodinated Diagnostic Agents]    Latanoprost    Tape    Tetracyclines & Related    Trusopt [dorzolamide Hcl]    Zocor [simvastatin]    Zovirax [acyclovir]    Macrobid [nitrofurantoin] Rash   Sulfa Antibiotics Rash      Medication List       Accurate as of November 14, 2019  4:23 PM. If you have any questions, ask your nurse or doctor.        acetaminophen 325 MG tablet Commonly known as: TYLENOL Take 650 mg by mouth every 6 (six) hours as needed.   alendronate-cholecalciferol 70-2800 MG-UNIT tablet Commonly known as: FOSAMAX PLUS D Take  1 tablet by mouth every 7 (seven) days. Take with a full glass of water on an empty stomach.   brimonidine 0.2 % ophthalmic solution Commonly known as: ALPHAGAN   busPIRone 10 MG tablet Commonly known as: BUSPAR Take 10 mg by mouth 2 (two) times daily.   cetirizine 10 MG tablet Commonly known as: ZYRTEC Take 10 mg by mouth daily.   cholecalciferol 400 UNIT/ML Liqd Commonly known as: D-VI-SOL Take 400 Units by mouth daily.   cyanocobalamin 1000 MCG tablet Take 100 mcg by mouth daily.   diclofenac 1.3 % Ptch Commonly known as: FLECTOR Place 1 patch onto the skin 2 (two) times daily.   dorzolamide 2 % ophthalmic solution Commonly known as: TRUSOPT   DULoxetine 30 MG capsule Commonly known  as: CYMBALTA Take by mouth.   enalapril 10 MG tablet Commonly known as: VASOTEC   fluticasone 50 MCG/ACT nasal spray Commonly known as: FLONASE Place 2 sprays into both nostrils daily.   hydrochlorothiazide 25 MG tablet Commonly known as: HYDRODIURIL Take 25 mg by mouth daily.   hyoscyamine 0.125 MG tablet Commonly known as: LEVSIN Take by mouth.   latanoprost 0.005 % ophthalmic solution Commonly known as: XALATAN Place 1 drop into both eyes at bedtime.   magnesium oxide 400 (241.3 Mg) MG tablet Commonly known as: MAG-OX   metoprolol succinate 25 MG 24 hr tablet Commonly known as: TOPROL-XL Take 25 mg by mouth daily.   nystatin-triamcinolone ointment Commonly known as: MYCOLOG Apply 1 application topically 2 (two) times daily. Started by: Anne Council, PA-C   omeprazole 20 MG capsule Commonly known as: PRILOSEC Take 20 mg by mouth 2 (two) times daily before a meal.   oxybutynin 5 MG tablet Commonly known as: DITROPAN TAKE (1) TABLET BY MOUTH EVERY EIGHT HOURS AS NEEDED BLADDER SPASMS   Premarin vaginal cream Generic drug: conjugated estrogens Place 1 Applicatorful vaginally daily. Use pea sized amount M-W-Fr before bedtime   Rhopressa 0.02 % Soln Generic drug: Netarsudil Dimesylate   rosuvastatin 5 MG tablet Commonly known as: CRESTOR Take 5 mg by mouth daily.   Travoprost (BAK Free) 0.004 % Soln ophthalmic solution Commonly known as: TRAVATAN   triamcinolone cream 0.1 % Commonly known as: KENALOG       Allergies:  Allergies  Allergen Reactions  . Penicillins Shortness Of Breath    Patient states it "brings her asthma back on"  . Duloxetine Other (See Comments)    nightmares  . Iodine Itching  . Ivp Dye [Iodinated Diagnostic Agents]   . Latanoprost   . Tape   . Tetracyclines & Related   . Trusopt [Dorzolamide Hcl]   . Zocor [Simvastatin]   . Zovirax [Acyclovir]   . Macrobid [Nitrofurantoin] Rash  . Sulfa Antibiotics Rash    Family  History: Family History  Problem Relation Age of Onset  . Leukemia Mother   . Diabetes Father   . Breast cancer Sister   . Brain cancer Brother   . Diabetes Sister   . Diabetes Brother   . Prostate cancer Neg Hx   . Kidney cancer Neg Hx   . Bladder Cancer Neg Hx     Social History:  reports that she has never smoked. She has never used smokeless tobacco. She reports that she does not drink alcohol or use drugs.  ROS: Pertinent ROS in HPI  Physical Exam: BP (!) 170/91   Pulse (!) 116   Ht 4\' 11"  (1.499 m)   Wt 163 lb (73.9 kg)  BMI 32.92 kg/m   Constitutional:  Well nourished. Alert and oriented, No acute distress. HEENT: [place] AT, mask in place.  Trachea midline, no masses. Cardiovascular: No clubbing, cyanosis, or edema. Respiratory: Normal respiratory effort, no increased work of breathing. GI: Abdomen is soft, non tender, non distended, no abdominal masses.  GU: No CVA tenderness.  No bladder fullness or masses.  Atrophic external genitalia, spares pubic hair distribution, no lesions.  Normal urethral meatus, no lesions, no prolapse, no discharge.   No urethral masses, tenderness and/or tenderness. No bladder fullness, tenderness or masses. Pale vagina mucosa, poor estrogen effect, no discharge, no lesions, fair pelvic support, grade II cystocele and no rectocele noted.  Erythematous, scaling plaques on inguinal folds, pubic area and around the anus with areas of clearing with the plaques.   Skin: No rashes, bruises or suspicious lesions. Lymph: No inguinal adenopathy. Neurologic: Grossly intact, no focal deficits, moving all 4 extremities. Psychiatric: Normal mood and affect.   Laboratory Data: Lab Results  Component Value Date   WBC 9.3 08/23/2018   HGB 13.0 08/23/2018   HCT 39.8 08/23/2018   MCV 92.8 08/23/2018   PLT 289 08/23/2018    Lab Results  Component Value Date   CREATININE 0.94 08/23/2018    Lab Results  Component Value Date   AST 21 08/26/2015    Lab Results  Component Value Date   ALT 14 08/26/2015    Urinalysis Component     Latest Ref Rng & Units 11/14/2019  Specific Gravity, UA     1.005 - 1.030 1.010  pH, UA     5.0 - 7.5 5.0  Color, UA     Yellow Yellow  Appearance Ur     Clear Clear  Leukocytes,UA     Negative Trace (A)  Protein,UA     Negative/Trace Negative  Glucose, UA     Negative Negative  Ketones, UA     Negative Negative  RBC, UA     Negative Negative  Bilirubin, UA     Negative Negative  Urobilinogen, Ur     0.2 - 1.0 mg/dL 0.2  Nitrite, UA     Negative Negative  Microscopic Examination      See below:   Component     Latest Ref Rng & Units 11/14/2019  WBC, UA     0 - 5 /hpf 6-10 (A)  RBC     0 - 2 /hpf None seen  Epithelial Cells (non renal)     0 - 10 /hpf 0-10  Bacteria, UA     None seen/Few Few    I have reviewed the labs.   Pertinent Imaging: Results for EVAJEAN, PENISTON (MRN [id number]) as of 11/14/2019 16:15  Ref. Range 11/14/2019 13:34  Scan Result Unknown 106    Assessment & Plan:    1. Dysuria - Urinalysis, Complete - CULTURE, URINE COMPREHENSIVE - Bladder Scan (Post Void Residual) in office - likely due to vaginal atrophy  2. Tinea cruris - prescribed Mycolog cream twice daily - RTC in two weeks  3. Vaginal atrophy -Explained to the patient that the vaginal estrogen cream is to be utilized only on Monday, Wednesday and Friday nights.  It is not a lubricant or a lotion.  It contains estrogen and we are trying to reestrogenized her vaginal area and attempt to relieve her of the vaginal burning    Return in about 2 weeks (around 11/28/2019) for recheck .  These notes generated with voice recognition software.  I apologize for typographical errors.  Anne Council, PA-C  [facility] [address]  [place], [place] [address] [phone]

## 2019-11-18 ENCOUNTER — Telehealth: Payer: Self-pay | Admitting: Family Medicine

## 2019-11-18 LAB — CULTURE, URINE COMPREHENSIVE

## 2019-11-18 NOTE — Telephone Encounter (Signed)
LMOM informed of negative urine culture.

## 2019-11-18 NOTE — Telephone Encounter (Signed)
-----   Message from Nori Riis, PA-C sent at 11/18/2019  7:50 AM EST ----- Please let Anne Steele know that her urine culture grew out a likely skin contaminate.  We do not need to treat with antibiotics at this time.

## 2019-11-28 ENCOUNTER — Encounter: Payer: Self-pay | Admitting: Urology

## 2019-11-28 ENCOUNTER — Ambulatory Visit: Payer: Medicare Other | Admitting: Urology

## 2019-11-28 ENCOUNTER — Other Ambulatory Visit: Payer: Self-pay

## 2019-11-28 VITALS — BP 158/88 | HR 78 | Ht 59.0 in | Wt 163.4 lb

## 2019-11-28 DIAGNOSIS — R300 Dysuria: Secondary | ICD-10-CM | POA: Diagnosis not present

## 2019-11-28 DIAGNOSIS — N952 Postmenopausal atrophic vaginitis: Secondary | ICD-10-CM

## 2019-11-28 DIAGNOSIS — B356 Tinea cruris: Secondary | ICD-10-CM

## 2019-11-28 DIAGNOSIS — R21 Rash and other nonspecific skin eruption: Secondary | ICD-10-CM | POA: Diagnosis not present

## 2019-11-28 NOTE — Progress Notes (Signed)
11/28/2019 2:39 PM   Dylin Alvin Critchley 1941/03/22 [id number]  Referring provider: Tracie Harrier, MD [address] [facility] [place],  [place] [address]  Chief Complaint  Patient presents with  . Vaginal Atrophy    HPI: Mrs. Boley is a 79 year old female with dysuria, urge incontinence and vaginal atrophy who presents today for recheck on tinea cruris and vaginal burning.  At her visit on 11/14/2019, she stated that she has had a rash in her groin area bilaterally, labia and around her rectum for the last 2 weeks.  She stated that when she sits in the tub, the rash stings.  She had noticed a fluid and discharge from the rash.  She denies any change in soaps, lotions or laundry detergent.  She does wear depends for incontinence.  She had tried the vaginal estrogen cream, diaper rash ointment and Monistat cream on the rash.  She had noted the greatest improvement with the Monistat cream.    She was prescribed Mycolog cream to apply twice daily after she cleansed and dried the areas thoroughly.  She was also advised only to use the vaginal estrogen cream on Monday, Wednesday and Friday nights.   She states that the rash has abated but she is still having itching in her vaginal area.  During the exam she points the area of irritation.  It is located on her right labial majora on the medial side.  She states the irritation in that area comes and goes.  She did admit that her husband had been unfaithful to her and they tried to reconcile their marriage.  Patient denies any modifying or aggravating factors.  Patient denies any gross hematuria, dysuria or suprapubic/flank pain.  Patient denies any fevers, chills, nausea or vomiting.   PMH: Past Medical History:  Diagnosis Date  . Arthritis   . Asthma   . Bell palsy 1973  . Bell's palsy   . Depression   . Fibromyalgia   . GERD (gastroesophageal reflux disease)   . Head ache   . Hiatal hernia   . History of  kidney stones   . Hypercalcemia   . Hypertension   . Kidney stones   . Optic nerve disease     Surgical History: Past Surgical History:  Procedure Laterality Date  . ABDOMINAL HYSTERECTOMY  1982  . BREAST BIOPSY Right 03/04/2016   US biopsy/ INTRADUCTAL PAPILLOMA  . BREAST BIOPSY Left 03/04/2016   Fibroadenoma  . BREAST EXCISIONAL BIOPSY Left 03/05/2019   Inflamed sebaceous cyst of the left breast, excised by dr. Bary Castilla in office BENIGN EPIDERMAL INCLUSION CYST WITH POSSIBLE RUPTURE AND ASSOCIATED INFLAMMATORY RESPONSE  . CARPAL TUNNEL RELEASE Bilateral 2012  . CATARACT EXTRACTION Bilateral 2017  . CHOLECYSTECTOMY  2001  . CYSTOSCOPY W/ RETROGRADES Bilateral 08/29/2018   Procedure: CYSTOSCOPY WITH RETROGRADE PYELOGRAM;  Surgeon: Hollice Espy, MD;  Location: [facility] ORS;  Service: Urology;  Laterality: Bilateral;  . CYSTOSCOPY WITH BIOPSY N/A 08/29/2018   Procedure: CYSTOSCOPY WITH Bladder BIOPSY;  Surgeon: Hollice Espy, MD;  Location: [facility] OR[facility];  Service: Urology;  Laterality: N/A;  . KNEE SURGERY Left     Home Medications:  Allergies as of 11/28/2019      Reactions   Penicillins Shortness Of Breath   Patient states it "brings her asthma back on"   Duloxetine Other (See Comments)   nightmares   Iodine Itching   Ivp Dye [iodinated Diagnostic Agents]    Latanoprost    Tape  Tetracyclines & Related    Trusopt [dorzolamide Hcl]    Zocor [simvastatin]    Zovirax [acyclovir]    Macrobid [nitrofurantoin] Rash   Sulfa Antibiotics Rash      Medication List       Accurate as of November 28, 2019 11:59 PM. If you have any questions, ask your nurse or doctor.        acetaminophen 325 MG tablet Commonly known as: TYLENOL Take 650 mg by mouth every 6 (six) hours as needed.   alendronate-cholecalciferol 70-2800 MG-UNIT tablet Commonly known as: FOSAMAX PLUS D Take 1 tablet by mouth every 7 (seven) days. Take with a full glass of water on an empty stomach.   brimonidine  0.2 % ophthalmic solution Commonly known as: ALPHAGAN   busPIRone 10 MG tablet Commonly known as: BUSPAR Take 10 mg by mouth 2 (two) times daily.   cetirizine 10 MG tablet Commonly known as: ZYRTEC Take 10 mg by mouth daily.   cholecalciferol 400 UNIT/ML Liqd Commonly known as: D-VI-SOL Take 400 Units by mouth daily.   cyanocobalamin 1000 MCG tablet Take 100 mcg by mouth daily.   cyclobenzaprine 5 MG tablet Commonly known as: FLEXERIL Take 5 mg by mouth 2 (two) times daily.   diclofenac 1.3 % Ptch Commonly known as: FLECTOR Place 1 patch onto the skin 2 (two) times daily.   dorzolamide 2 % ophthalmic solution Commonly known as: TRUSOPT   DULoxetine 30 MG capsule Commonly known as: CYMBALTA Take by mouth.   enalapril 10 MG tablet Commonly known as: VASOTEC   fluticasone 50 MCG/ACT nasal spray Commonly known as: FLONASE Place 2 sprays into both nostrils daily.   hydrochlorothiazide 25 MG tablet Commonly known as: HYDRODIURIL Take 25 mg by mouth daily.   hyoscyamine 0.125 MG tablet Commonly known as: LEVSIN Take by mouth.   latanoprost 0.005 % ophthalmic solution Commonly known as: XALATAN Place 1 drop into both eyes at bedtime.   magnesium oxide 400 (241.3 Mg) MG tablet Commonly known as: MAG-OX   metoprolol succinate 25 MG 24 hr tablet Commonly known as: TOPROL-XL Take 25 mg by mouth daily.   nystatin-triamcinolone ointment Commonly known as: MYCOLOG Apply 1 application topically 2 (two) times daily.   omeprazole 20 MG capsule Commonly known as: PRILOSEC Take 20 mg by mouth 2 (two) times daily before a meal.   oxybutynin 5 MG tablet Commonly known as: DITROPAN TAKE (1) TABLET BY MOUTH EVERY EIGHT HOURS AS NEEDED BLADDER SPASMS   Premarin vaginal cream Generic drug: conjugated estrogens Place 1 Applicatorful vaginally daily. Use pea sized amount M-W-Fr before bedtime   Rhopressa 0.02 % Soln Generic drug: Netarsudil Dimesylate   rosuvastatin  5 MG tablet Commonly known as: CRESTOR Take 5 mg by mouth daily.   Travoprost (BAK Free) 0.004 % Soln ophthalmic solution Commonly known as: TRAVATAN   triamcinolone cream 0.1 % Commonly known as: KENALOG       Allergies:  Allergies  Allergen Reactions  . Penicillins Shortness Of Breath    Patient states it "brings her asthma back on"  . Duloxetine Other (See Comments)    nightmares  . Iodine Itching  . Ivp Dye [Iodinated Diagnostic Agents]   . Latanoprost   . Tape   . Tetracyclines & Related   . Trusopt [Dorzolamide Hcl]   . Zocor [Simvastatin]   . Zovirax [Acyclovir]   . Macrobid [Nitrofurantoin] Rash  . Sulfa Antibiotics Rash    Family History: Family History  Problem Relation Age of  Onset  . Leukemia Mother   . Diabetes Father   . Breast cancer Sister   . Brain cancer Brother   . Diabetes Sister   . Diabetes Brother   . Prostate cancer Neg Hx   . Kidney cancer Neg Hx   . Bladder Cancer Neg Hx     Social History:  reports that she has never smoked. She has never used smokeless tobacco. She reports that she does not drink alcohol or use drugs.  ROS: Pertinent ROS in HPI  Physical Exam: BP (!) 158/88   Pulse 78   Ht 4\' 11"  (1.499 m)   Wt 163 lb 6.4 oz (74.1 kg)   BMI 33.00 kg/m   Constitutional:  Well nourished. Alert and oriented, No acute distress. HEENT: [place] AT, mask in place.  Trachea midline, no masses. Cardiovascular: No clubbing, cyanosis, or edema. Respiratory: Normal respiratory effort, no increased work of breathing. GI: Abdomen is soft, non tender, non distended, no abdominal masses.  GU: No CVA tenderness.  No bladder fullness or masses.  Atrophic external genitalia, right labia majora on medial side a small patch of erythematous, tiny ulcers are present, she states she has been scratching the area,  sparse pubic hair distribution, no lesions.  Normal urethral meatus, no lesions, no prolapse, no discharge.   No urethral masses, tenderness  and/or tenderness. No bladder fullness, tenderness or masses. Pale vagina mucosa, poor estrogen effect, no discharge, no lesions, fair pelvic support, grade II cystocele and no rectocele noted.   Anus and perineum are without rashes or lesions.    Lymph: No inguinal adenopathy. Neurologic: Grossly intact, no focal deficits, moving all 4 extremities. Psychiatric: Normal mood and affect.   Laboratory Data: Lab Results  Component Value Date   WBC 9.3 08/23/2018   HGB 13.0 08/23/2018   HCT 39.8 08/23/2018   MCV 92.8 08/23/2018   PLT 289 08/23/2018    Lab Results  Component Value Date   CREATININE 0.94 08/23/2018    Lab Results  Component Value Date   AST 21 08/26/2015   Lab Results  Component Value Date   ALT 14 08/26/2015    Urinalysis Component     Latest Ref Rng & Units 11/14/2019  Specific Gravity, UA     1.005 - 1.030 1.010  pH, UA     5.0 - 7.5 5.0  Color, UA     Yellow Yellow  Appearance Ur     Clear Clear  Leukocytes,UA     Negative Trace (A)  Protein,UA     Negative/Trace Negative  Glucose, UA     Negative Negative  Ketones, UA     Negative Negative  RBC, UA     Negative Negative  Bilirubin, UA     Negative Negative  Urobilinogen, Ur     0.2 - 1.0 mg/dL 0.2  Nitrite, UA     Negative Negative  Microscopic Examination      See below:   Component     Latest Ref Rng & Units 11/14/2019  WBC, UA     0 - 5 /hpf 6-10 (A)  RBC     0 - 2 /hpf None seen  Epithelial Cells (non renal)     0 - 10 /hpf 0-10  Bacteria, UA     None seen/Few Few    I have reviewed the labs.   Pertinent Imaging: Results for CAIRI, SOBOTKA (MRN [id number]) as of 11/14/2019 16:15  Ref. Range 11/14/2019 13:34  Scan Result  Unknown 106    Assessment & Plan:    1. Dysuria - cystoscopy x 2 have been benign  - likely due to vaginal atrophy - continue vaginal estrogen cream  2. Tinea cruris -Resolved  3. Vaginal atrophy -Continue vaginal estrogen cream Monday  nights, Wednesday nights and Friday nights  4. Vaginal ulcers - ? Herpes - We will send for HSV 1 and 2 as patient's husband was unfaithful -Follow-up pending lab results  Return for pending labs .  These notes generated with voice recognition software. I apologize for typographical errors.  Zara Council, PA-C  [facility] [address]  [place], [place] [address] [phone]

## 2019-11-29 ENCOUNTER — Telehealth: Payer: Self-pay | Admitting: Family Medicine

## 2019-11-29 LAB — HSV(HERPES SIMPLEX VRS) I + II AB-IGG
HSV 1 Glycoprotein G Ab, IgG: 34 index — ABNORMAL HIGH (ref 0.00–0.90)
HSV 2 IgG, Type Spec: <0.91 index (ref 0.00–0.90)

## 2019-11-29 MED ORDER — VALACYCLOVIR HCL 1 G PO TABS: 1000.0000 mg | ORAL_TABLET | Freq: Two times a day (BID) | ORAL | 0 refills | Status: DC

## 2019-11-29 NOTE — Telephone Encounter (Signed)
Unable to leave message, no voicemail set up.

## 2019-11-29 NOTE — Telephone Encounter (Signed)
Patient notified and voiced understanding. RX sent to pharmacy. 

## 2019-11-29 NOTE — Telephone Encounter (Signed)
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  I would like her to start Valtrex 1000 mg, BID x 10 days and check her again in 2 weeks.

## 2019-12-10 ENCOUNTER — Other Ambulatory Visit: Payer: Self-pay | Admitting: Internal Medicine

## 2019-12-10 DIAGNOSIS — N1831 Chronic kidney disease, stage 3a: Secondary | ICD-10-CM | POA: Insufficient documentation

## 2019-12-10 DIAGNOSIS — Z1231 Encounter for screening mammogram for malignant neoplasm of breast: Secondary | ICD-10-CM

## 2019-12-16 ENCOUNTER — Ambulatory Visit: Payer: Self-pay | Admitting: Urology

## 2020-02-05 ENCOUNTER — Other Ambulatory Visit: Payer: Self-pay | Admitting: Internal Medicine

## 2020-02-05 DIAGNOSIS — I10 Essential (primary) hypertension: Secondary | ICD-10-CM

## 2020-02-05 DIAGNOSIS — R519 Headache, unspecified: Secondary | ICD-10-CM

## 2020-02-05 DIAGNOSIS — Z8669 Personal history of other diseases of the nervous system and sense organs: Secondary | ICD-10-CM

## 2020-02-19 ENCOUNTER — Ambulatory Visit: Payer: Self-pay | Admitting: Surgery

## 2020-02-19 NOTE — H&P (Signed)
Subjective:   CC: Headache disorder [R51.9]  HPI:  Anne Steele is a 79 y.o. female who was referred by Lisa Roca* for evaluation of above. First noted several years ago.  Recently noted some bilateral vision changes along with the headaches so seen by primary, shown to have elevated ESR, started on steroids and referred for possible temporal artery biopsy referral.  Describes headache as pressure, with radiation from face to entire head, occasionally worse with palpation of face.  Slightly and occasionally alleviated with cold towel over foreheard.  Vision changes include seeing "stars", not always related to headache exacerbations.  Thinks she sees in both eyes, but couldn't say for sure.  Vision changes are sporadic, cannot recall any instigating factors, except possibly her recent epidural injections for her back issues.     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), Fibromyalgia, GERD (gastroesophageal reflux disease), Herniated disc, History of bone density study (09/22/2005), History of bone density study (09/25/2013), History of kidney stones, History of shingles, HTN (hypertension), benign (05/01/2014), Hypertension, Incontinence of urine, Osteoarthrosis, unspecified whether generalized or localized, lower leg, Osteonecrosis (CMS-HCC), Osteonecrosis (CMS-HCC), Other and unspecified hyperlipidemia, 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  Past Surgical History:  has a past surgical history that includes Cholecystectomy;  Hysterectomy; Colonoscopy (03/10/1992); Colonoscopy (01/09/1996); Colonoscopy (06/26/1998); Colonoscopy (06/24/2002); Colonoscopy (02/11/2005); Colonoscopy (04/29/2009); Sigmoidoscopy (12/07/1995); Sigmoidoscopy (06/07/2000); egd (06/24/2002); Colonoscopy (02/14/2014); Cataract extraction w/ intraocular lens implant & anterior vitrectomy, bilateral (March 2006); Cyst removal from finger; Right mass excision removed off right thumb 01/13/06, left 06/29/2006; Knee arthroscopy (Left, 01/07/2011); ganglion removal; Endoscopic Carpal Tunnel Release (Left, 2012); Endoscopic Carpal Tunnel Release (Right, 2012); Breast excisional biopsy (Bilateral, 02/2016); and bladder tumor removal (08/2018).  Family History: family history includes Brain cancer in her brother; Breast cancer in her sister; Diabetes in her brother, brother, and sister; Diabetes type II in her father; Heart disease in her brother, brother, brother, and father; Heart failure in her maternal grandfather; Heart murmur in her sister; High blood pressure (Hypertension) in her brother, brother, sister, and sister; Leukemia in her mother; Myocardial Infarction (Heart attack) in her brother and father; Other in her maternal grandmother; Seizures in her mother; Stroke in her paternal grandfather and paternal grandmother; Thyroid disease in her sister.  Social History:  reports that she has never smoked. She has never used smokeless tobacco. She reports that she does not drink alcohol and does not use drugs.  Current Medications: has a current medication list which includes the following prescription(s): acetaminophen, allopurinol, buspirone, enalapril, fluticasone propionate, gabapentin, loratadine, magnesium oxide, metoprolol succinate, nortriptyline, omeprazole, oxybutynin, prednisone, rosuvastatin, tizanidine, topiramate, travoprost, UNABLE TO FIND, vitamin e, white petrolatum-mineral oil, cetirizine, and cyanocobalamin.  Allergies:       Allergies   Allergen Reactions  . Iodinated Contrast Media Anaphylaxis  . Adhesive Rash  . Ciprofloxacin (Bulk) Unknown  . Ciprofloxacin Betaine Unknown  . Cymbalta [Duloxetine] Other (See Comments)    nightmares  . Dorzolamide Hcl Unknown  . Elavil [Amitriptyline] Other (See Comments)    Heavy chest  . Iodine Itching  . Latanoprost Other (See Comments)    Blood shot eyes  . Motrin [  Ibuprofen] Other (See Comments)    GI issues  . Neosporin [Benzalkonium Chloride] Rash  . Penicillins Other (See Comments)    Difficulty breathing  . Sulfa (Sulfonamide Antibiotics) Rash  . Tetracycline Swelling  . Zocor [Simvastatin] Muscle Pain  . Zovirax [Acyclovir] Unknown  . Nitrofurantoin Rash    ROS:  A 15 point review of systems was performed and pertinent positives and negatives noted in HPI   Objective:   BP 164/79   Pulse 71   Ht 149.9 cm ('4\' 11"' )   Wt 72.1 kg (159 lb)   BMI 32.11 kg/m   Constitutional :  alert, appears stated age, cooperative and no distress  Lymphatics/Throat:  no asymmetry, masses, or scars  Respiratory:  clear to auscultation bilaterally  Cardiovascular:  regular rate and rhythm  Gastrointestinal: soft, non-tender; bowel sounds normal; no masses,  no organomegaly.    Musculoskeletal: Steady gait and movement  Skin: Cool and moist. No easily palpable temporal arteries bilaterally.  Psychiatric: Normal affect, non-agitated, not confused       LABS:  Elevated ESR of 108  RADS: Pending MRI head  Assessment:      Headache disorder [R51.9]  Vision changes  Plan:   1. Headache disorder [R51.9]  Discussed temporal artery biopsy.  Alternatives include continued observation.  Benefits include possible symptom relief, pathologic evaluation,Discussed the risk of surgery including recurrence, chronic pain, post-op infxn, poor cosmesis, poor/delayed wound healing, and possible re-operation to address said risks. The risks of general anesthetic, if  used, includes MI, CVA, sudden death or even reaction to anesthetic medications also discussed.  Typical post-op recovery timewith possible activity restrictions were also discussed.  The patient verbalized understanding and all questions were answered to the patient's satisfaction.     Electronically signed by Benjamine Sprague, DO on 02/11/2020 2:51 PM

## 2020-02-19 NOTE — H&P (View-Only) (Signed)
Subjective:   CC: Headache disorder [R51.9]  HPI:  Anne Steele is a 79 y.o. female who was referred by Lisa Roca* for evaluation of above. First noted several years ago.  Recently noted some bilateral vision changes along with the headaches so seen by primary, shown to have elevated ESR, started on steroids and referred for possible temporal artery biopsy referral.  Describes headache as pressure, with radiation from face to entire head, occasionally worse with palpation of face.  Slightly and occasionally alleviated with cold towel over foreheard.  Vision changes include seeing "stars", not always related to headache exacerbations.  Thinks she sees in both eyes, but couldn't say for sure.  Vision changes are sporadic, cannot recall any instigating factors, except possibly her recent epidural injections for her back issues.     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), Fibromyalgia, GERD (gastroesophageal reflux disease), Herniated disc, History of bone density study (09/22/2005), History of bone density study (09/25/2013), History of kidney stones, History of shingles, HTN (hypertension), benign (05/01/2014), Hypertension, Incontinence of urine, Osteoarthrosis, unspecified whether generalized or localized, lower leg, Osteonecrosis (CMS-HCC), Osteonecrosis (CMS-HCC), Other and unspecified hyperlipidemia, 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  Past Surgical History:  has a past surgical history that includes Cholecystectomy;  Hysterectomy; Colonoscopy (03/10/1992); Colonoscopy (01/09/1996); Colonoscopy (06/26/1998); Colonoscopy (06/24/2002); Colonoscopy (02/11/2005); Colonoscopy (04/29/2009); Sigmoidoscopy (12/07/1995); Sigmoidoscopy (06/07/2000); egd (06/24/2002); Colonoscopy (02/14/2014); Cataract extraction w/ intraocular lens implant & anterior vitrectomy, bilateral (March 2006); Cyst removal from finger; Right mass excision removed off right thumb 01/13/06, left 06/29/2006; Knee arthroscopy (Left, 01/07/2011); ganglion removal; Endoscopic Carpal Tunnel Release (Left, 2012); Endoscopic Carpal Tunnel Release (Right, 2012); Breast excisional biopsy (Bilateral, 02/2016); and bladder tumor removal (08/2018).  Family History: family history includes Brain cancer in her brother; Breast cancer in her sister; Diabetes in her brother, brother, and sister; Diabetes type II in her father; Heart disease in her brother, brother, brother, and father; Heart failure in her maternal grandfather; Heart murmur in her sister; High blood pressure (Hypertension) in her brother, brother, sister, and sister; Leukemia in her mother; Myocardial Infarction (Heart attack) in her brother and father; Other in her maternal grandmother; Seizures in her mother; Stroke in her paternal grandfather and paternal grandmother; Thyroid disease in her sister.  Social History:  reports that she has never smoked. She has never used smokeless tobacco. She reports that she does not drink alcohol and does not use drugs.  Current Medications: has a current medication list which includes the following prescription(s): acetaminophen, allopurinol, buspirone, enalapril, fluticasone propionate, gabapentin, loratadine, magnesium oxide, metoprolol succinate, nortriptyline, omeprazole, oxybutynin, prednisone, rosuvastatin, tizanidine, topiramate, travoprost, UNABLE TO FIND, vitamin e, white petrolatum-mineral oil, cetirizine, and cyanocobalamin.  Allergies:       Allergies   Allergen Reactions  . Iodinated Contrast Media Anaphylaxis  . Adhesive Rash  . Ciprofloxacin (Bulk) Unknown  . Ciprofloxacin Betaine Unknown  . Cymbalta [Duloxetine] Other (See Comments)    nightmares  . Dorzolamide Hcl Unknown  . Elavil [Amitriptyline] Other (See Comments)    Heavy chest  . Iodine Itching  . Latanoprost Other (See Comments)    Blood shot eyes  . Motrin [  Ibuprofen] Other (See Comments)    GI issues  . Neosporin [Benzalkonium Chloride] Rash  . Penicillins Other (See Comments)    Difficulty breathing  . Sulfa (Sulfonamide Antibiotics) Rash  . Tetracycline Swelling  . Zocor [Simvastatin] Muscle Pain  . Zovirax [Acyclovir] Unknown  . Nitrofurantoin Rash    ROS:  A 15 point review of systems was performed and pertinent positives and negatives noted in HPI   Objective:   BP 164/79   Pulse 71   Ht 149.9 cm ('4\' 11"' )   Wt 72.1 kg (159 lb)   BMI 32.11 kg/m   Constitutional :  alert, appears stated age, cooperative and no distress  Lymphatics/Throat:  no asymmetry, masses, or scars  Respiratory:  clear to auscultation bilaterally  Cardiovascular:  regular rate and rhythm  Gastrointestinal: soft, non-tender; bowel sounds normal; no masses,  no organomegaly.    Musculoskeletal: Steady gait and movement  Skin: Cool and moist. No easily palpable temporal arteries bilaterally.  Psychiatric: Normal affect, non-agitated, not confused       LABS:  Elevated ESR of 108  RADS: Pending MRI head  Assessment:      Headache disorder [R51.9]  Vision changes  Plan:   1. Headache disorder [R51.9]  Discussed temporal artery biopsy.  Alternatives include continued observation.  Benefits include possible symptom relief, pathologic evaluation,Discussed the risk of surgery including recurrence, chronic pain, post-op infxn, poor cosmesis, poor/delayed wound healing, and possible re-operation to address said risks. The risks of general anesthetic, if  used, includes MI, CVA, sudden death or even reaction to anesthetic medications also discussed.  Typical post-op recovery timewith possible activity restrictions were also discussed.  The patient verbalized understanding and all questions were answered to the patient's satisfaction.     Electronically signed by Benjamine Sprague, DO on 02/11/2020 2:51 PM

## 2020-02-20 ENCOUNTER — Other Ambulatory Visit
Admission: RE | Admit: 2020-02-20 | Discharge: 2020-02-20 | Disposition: A | Discharge status: Home / Self Care | Payer: Medicare Other | Source: Ambulatory Visit | Attending: Surgery | Admitting: Surgery

## 2020-02-20 ENCOUNTER — Other Ambulatory Visit: Payer: Self-pay

## 2020-02-20 DIAGNOSIS — Z20822 Contact with and (suspected) exposure to covid-19: Secondary | ICD-10-CM | POA: Insufficient documentation

## 2020-02-20 DIAGNOSIS — Z01812 Encounter for preprocedural laboratory examination: Secondary | ICD-10-CM | POA: Diagnosis present

## 2020-02-20 LAB — SARS CORONAVIRUS 2 (TAT 6-24 HRS): SARS Coronavirus 2: NEGATIVE

## 2020-02-21 ENCOUNTER — Other Ambulatory Visit: Payer: Self-pay

## 2020-02-21 ENCOUNTER — Ambulatory Visit
Admission: RE | Admit: 2020-02-21 | Discharge: 2020-02-21 | Disposition: A | Discharge status: Home / Self Care | Payer: Medicare Other | Source: Ambulatory Visit | Attending: Internal Medicine | Admitting: Internal Medicine

## 2020-02-21 ENCOUNTER — Encounter
Admission: RE | Admit: 2020-02-21 | Discharge: 2020-02-21 | Disposition: A | Discharge status: Home / Self Care | Payer: Medicare Other | Source: Ambulatory Visit | Attending: Surgery | Admitting: Surgery

## 2020-02-21 DIAGNOSIS — Z8669 Personal history of other diseases of the nervous system and sense organs: Secondary | ICD-10-CM | POA: Insufficient documentation

## 2020-02-21 DIAGNOSIS — I10 Essential (primary) hypertension: Secondary | ICD-10-CM | POA: Diagnosis present

## 2020-02-21 DIAGNOSIS — R519 Headache, unspecified: Secondary | ICD-10-CM | POA: Diagnosis not present

## 2020-02-21 HISTORY — DX: Benign neoplasm of bladder: D30.3

## 2020-02-21 HISTORY — DX: Vitamin D deficiency, unspecified: E55.9

## 2020-02-21 HISTORY — DX: Zoster without complications: B02.9

## 2020-02-21 LAB — BASIC METABOLIC PANEL
Anion gap: 12 (ref 5–15)
BUN: 40 mg/dL — ABNORMAL HIGH (ref 8–23)
CO2: 27 mmol/L (ref 22–32)
Calcium: 10.5 mg/dL — ABNORMAL HIGH (ref 8.9–10.3)
Chloride: 101 mmol/L (ref 98–111)
Creatinine, Ser: 1.1 mg/dL — ABNORMAL HIGH (ref 0.44–1.00)
GFR calc Af Amer: 55 mL/min — ABNORMAL LOW (ref >60)
GFR calc non Af Amer: 48 mL/min — ABNORMAL LOW (ref >60)
Glucose, Bld: 109 mg/dL — ABNORMAL HIGH (ref 70–99)
Potassium: 4 mmol/L (ref 3.5–5.1)
Sodium: 140 mmol/L (ref 135–145)

## 2020-02-21 LAB — CBC
HCT: 39.9 % (ref 36.0–46.0)
Hemoglobin: 12.9 g/dL (ref 12.0–15.0)
MCH: 30.9 pg (ref 26.0–34.0)
MCHC: 32.3 g/dL (ref 30.0–36.0)
MCV: 95.5 fL (ref 80.0–100.0)
Platelets: 235 10*3/uL (ref 150–400)
RBC: 4.18 MIL/uL (ref 3.87–5.11)
RDW: 13.9 % (ref 11.5–15.5)
WBC: 17.4 10*3/uL — ABNORMAL HIGH (ref 4.0–10.5)
nRBC: 0 % (ref 0.0–0.2)

## 2020-02-21 NOTE — Patient Instructions (Addendum)
Your procedure is scheduled on: Monday, June 7 Report to Day Surgery on the 2nd floor of the [facility]. To find out your arrival time, please call [phone] between 1PM - 3PM on: Friday, June 4  REMEMBER: Instructions that are not followed completely may result in serious medical risk, up to and including death; or upon the discretion of your surgeon and anesthesiologist your surgery may need to be rescheduled.  Do not eat food after midnight the night before surgery.  No gum chewing, lozengers or hard candies.  You may however, drink CLEAR liquids up to 2 hours before you are scheduled to arrive for your surgery. Do not drink anything within 2 hours of your scheduled arrival time.  Clear liquids include: - water  - apple juice without pulp - gatorade (not RED) - black coffee or tea (Do NOT add milk or creamers to the coffee or tea) Do NOT drink anything that is not on this list.  TAKE THESE MEDICATIONS THE MORNING OF SURGERY WITH A SIP OF WATER:  1.  BUSPIRONE  2.  METOPROLOL 3.  OMEPRAZOLE - (take one the night before and one on the morning of surgery - helps to prevent nausea after surgery.)  Stop Anti-inflammatories (NSAIDS) such as Advil, Aleve, Ibuprofen, Motrin, Naproxen, Naprosyn and Aspirin based products such as Excedrin, Goodys Powder, BC Powder. (May take Tylenol or Acetaminophen if needed.)  Stop ANY OVER THE COUNTER supplements until after surgery. (MAGNESIUM, VITAMIN E) (May continue Vitamin D, Vitamin B, and multivitamin.)  On the morning of surgery brush your teeth with toothpaste and water, you may rinse your mouth with mouthwash if you wish. Do not swallow any toothpaste or mouthwash.  Do not wear jewelry, make-up, hairpins, clips or nail polish.  Do not wear lotions, powders, or perfumes.   Do not shave 48 hours prior to surgery.   Contact lenses, hearing aids and dentures may not be worn into surgery.  Do not bring valuables to the hospital. [facility] is not responsible for any missing/lost belongings or valuables.   Use CHG Soap as directed on instruction sheet.  Notify your doctor if there is any change in your medical condition (cold, fever, infection).  Wear comfortable clothing (specific to your surgery type) to the hospital.  If you are being discharged the day of surgery, you will not be allowed to drive home. You will need a responsible adult (18 years or older) to drive you home and stay with you that night.   Please call the [facility] Dept. at [phone] if you have any questions about these instructions.  Visitation Policy:  Patients undergoing a surgery or procedure may have one family member or support person with them as long as that person is not COVID-19 positive or experiencing its symptoms.  That person may remain in the waiting area during the procedure.  As a reminder, masks are still required for all [facility] team members, patients and visitors in all [facility] facilities.   Systemwide, no visitors 17 or younger.

## 2020-02-24 ENCOUNTER — Ambulatory Visit: Payer: Medicare Other | Admitting: Registered Nurse

## 2020-02-24 ENCOUNTER — Encounter
Admission: RE | Disposition: A | Discharge status: Home / Self Care | Payer: Self-pay | Source: Home / Self Care | Attending: Surgery

## 2020-02-24 ENCOUNTER — Encounter: Payer: Self-pay | Admitting: Surgery

## 2020-02-24 ENCOUNTER — Ambulatory Visit
Admission: RE | Admit: 2020-02-24 | Discharge: 2020-02-24 | Disposition: A | Discharge status: Home / Self Care | Payer: Medicare Other | Attending: Surgery | Admitting: Surgery

## 2020-02-24 ENCOUNTER — Other Ambulatory Visit: Payer: Self-pay

## 2020-02-24 DIAGNOSIS — M316 Other giant cell arteritis: Secondary | ICD-10-CM | POA: Insufficient documentation

## 2020-02-24 DIAGNOSIS — Z8249 Family history of ischemic heart disease and other diseases of the circulatory system: Secondary | ICD-10-CM | POA: Diagnosis not present

## 2020-02-24 DIAGNOSIS — R519 Headache, unspecified: Secondary | ICD-10-CM | POA: Diagnosis present

## 2020-02-24 DIAGNOSIS — Z886 Allergy status to analgesic agent status: Secondary | ICD-10-CM | POA: Diagnosis not present

## 2020-02-24 DIAGNOSIS — E1122 Type 2 diabetes mellitus with diabetic chronic kidney disease: Secondary | ICD-10-CM | POA: Diagnosis not present

## 2020-02-24 DIAGNOSIS — Z882 Allergy status to sulfonamides status: Secondary | ICD-10-CM | POA: Diagnosis not present

## 2020-02-24 DIAGNOSIS — Z880 Allergy status to penicillin: Secondary | ICD-10-CM | POA: Insufficient documentation

## 2020-02-24 DIAGNOSIS — N189 Chronic kidney disease, unspecified: Secondary | ICD-10-CM | POA: Insufficient documentation

## 2020-02-24 DIAGNOSIS — I4891 Unspecified atrial fibrillation: Secondary | ICD-10-CM | POA: Insufficient documentation

## 2020-02-24 DIAGNOSIS — I129 Hypertensive chronic kidney disease with stage 1 through stage 4 chronic kidney disease, or unspecified chronic kidney disease: Secondary | ICD-10-CM | POA: Diagnosis not present

## 2020-02-24 DIAGNOSIS — Z881 Allergy status to other antibiotic agents status: Secondary | ICD-10-CM | POA: Diagnosis not present

## 2020-02-24 DIAGNOSIS — M797 Fibromyalgia: Secondary | ICD-10-CM | POA: Insufficient documentation

## 2020-02-24 DIAGNOSIS — Z888 Allergy status to other drugs, medicaments and biological substances status: Secondary | ICD-10-CM | POA: Diagnosis not present

## 2020-02-24 DIAGNOSIS — Z79899 Other long term (current) drug therapy: Secondary | ICD-10-CM | POA: Diagnosis not present

## 2020-02-24 DIAGNOSIS — Z833 Family history of diabetes mellitus: Secondary | ICD-10-CM | POA: Insufficient documentation

## 2020-02-24 DIAGNOSIS — Z7952 Long term (current) use of systemic steroids: Secondary | ICD-10-CM | POA: Diagnosis not present

## 2020-02-24 HISTORY — PX: ARTERY BIOPSY: SHX891

## 2020-02-24 SURGERY — BIOPSY TEMPORAL ARTERY
Anesthesia: General | Site: Head

## 2020-02-24 MED ORDER — PHENYLEPHRINE HCL (PRESSORS) 10 MG/ML IV SOLN
INTRAVENOUS | Status: DC | PRN
  Administered 2020-02-24 (×4): 50 ug via INTRAVENOUS
  Administered 2020-02-24 (×4): 100 ug via INTRAVENOUS
  Administered 2020-02-24 (×2): 50 ug via INTRAVENOUS

## 2020-02-24 MED ORDER — CHLORHEXIDINE GLUCONATE 0.12 % MT SOLN: 15.0000 mL | Freq: Once | OROMUCOSAL | Status: AC

## 2020-02-24 MED ORDER — LIDOCAINE HCL (PF) 2 % IJ SOLN
INTRAMUSCULAR | Status: AC
  Filled 2020-02-24: qty 5

## 2020-02-24 MED ORDER — PROPOFOL 10 MG/ML IV BOLUS
INTRAVENOUS | Status: AC
  Filled 2020-02-24: qty 20

## 2020-02-24 MED ORDER — CHLORHEXIDINE GLUCONATE 0.12 % MT SOLN
OROMUCOSAL | Status: AC
  Administered 2020-02-24: 15 mL via OROMUCOSAL
  Filled 2020-02-24: qty 15

## 2020-02-24 MED ORDER — EPHEDRINE SULFATE 50 MG/ML IJ SOLN
INTRAMUSCULAR | Status: DC | PRN
  Administered 2020-02-24: 10 mg via INTRAVENOUS

## 2020-02-24 MED ORDER — FENTANYL CITRATE (PF) 100 MCG/2ML IJ SOLN
INTRAMUSCULAR | Status: DC | PRN
  Administered 2020-02-24 (×2): 25 ug via INTRAVENOUS

## 2020-02-24 MED ORDER — OXYCODONE HCL 5 MG/5ML PO SOLN: 5.0000 mg | Freq: Once | ORAL | Status: AC | PRN

## 2020-02-24 MED ORDER — ACETAMINOPHEN 325 MG PO TABS: 650.0000 mg | ORAL_TABLET | Freq: Three times a day (TID) | ORAL | 0 refills | Status: AC | PRN

## 2020-02-24 MED ORDER — OXYCODONE HCL 5 MG PO TABS: 5.0000 mg | ORAL_TABLET | Freq: Once | ORAL | Status: AC | PRN

## 2020-02-24 MED ORDER — HYDROCODONE-ACETAMINOPHEN 5-325 MG PO TABS: 1.0000 | ORAL_TABLET | Freq: Four times a day (QID) | ORAL | 0 refills | Status: DC | PRN

## 2020-02-24 MED ORDER — FENTANYL CITRATE (PF) 100 MCG/2ML IJ SOLN
INTRAMUSCULAR | Status: AC
  Administered 2020-02-24: 25 ug via INTRAVENOUS
  Filled 2020-02-24: qty 2

## 2020-02-24 MED ORDER — GLYCOPYRROLATE 0.2 MG/ML IJ SOLN
INTRAMUSCULAR | Status: DC | PRN
  Administered 2020-02-24: .2 mg via INTRAVENOUS

## 2020-02-24 MED ORDER — CLINDAMYCIN PHOSPHATE 900 MG/50ML IV SOLN
INTRAVENOUS | Status: AC
  Filled 2020-02-24: qty 50

## 2020-02-24 MED ORDER — DEXMEDETOMIDINE HCL 200 MCG/2ML IV SOLN
INTRAVENOUS | Status: DC | PRN
  Administered 2020-02-24: 8 ug via INTRAVENOUS
  Administered 2020-02-24: 12 ug via INTRAVENOUS

## 2020-02-24 MED ORDER — FENTANYL CITRATE (PF) 100 MCG/2ML IJ SOLN
25.0000 ug | INTRAMUSCULAR | Status: DC | PRN
  Administered 2020-02-24 (×2): 25 ug via INTRAVENOUS

## 2020-02-24 MED ORDER — CHLORHEXIDINE GLUCONATE CLOTH 2 % EX PADS: 6.0000 | MEDICATED_PAD | Freq: Once | CUTANEOUS | Status: DC

## 2020-02-24 MED ORDER — GLYCOPYRROLATE 0.2 MG/ML IJ SOLN
INTRAMUSCULAR | Status: AC
  Filled 2020-02-24: qty 1

## 2020-02-24 MED ORDER — ROCURONIUM BROMIDE 10 MG/ML (PF) SYRINGE
PREFILLED_SYRINGE | INTRAVENOUS | Status: AC
  Filled 2020-02-24: qty 10

## 2020-02-24 MED ORDER — FENTANYL CITRATE (PF) 100 MCG/2ML IJ SOLN
INTRAMUSCULAR | Status: AC
  Filled 2020-02-24: qty 2

## 2020-02-24 MED ORDER — IBUPROFEN 400 MG PO TABS
400.0000 mg | ORAL_TABLET | Freq: Three times a day (TID) | ORAL | 0 refills | Status: DC | PRN
Start: 2020-02-24 — End: 2020-12-21

## 2020-02-24 MED ORDER — SODIUM CHLORIDE 0.9 % IV SOLN: INTRAVENOUS | Status: DC

## 2020-02-24 MED ORDER — LABETALOL HCL 5 MG/ML IV SOLN
INTRAVENOUS | Status: AC
  Filled 2020-02-24: qty 4

## 2020-02-24 MED ORDER — DOCUSATE SODIUM 100 MG PO CAPS: 100.0000 mg | ORAL_CAPSULE | Freq: Two times a day (BID) | ORAL | 0 refills | Status: AC | PRN

## 2020-02-24 MED ORDER — LIDOCAINE HCL 1 % IJ SOLN
INTRAMUSCULAR | Status: DC | PRN
  Administered 2020-02-24: 2 mL

## 2020-02-24 MED ORDER — ONDANSETRON HCL 4 MG/2ML IJ SOLN
INTRAMUSCULAR | Status: AC
  Filled 2020-02-24: qty 2

## 2020-02-24 MED ORDER — PROPOFOL 500 MG/50ML IV EMUL
INTRAVENOUS | Status: DC | PRN
  Administered 2020-02-24: 100 ug/kg/min via INTRAVENOUS

## 2020-02-24 MED ORDER — LIDOCAINE HCL (CARDIAC) PF 100 MG/5ML IV SOSY
PREFILLED_SYRINGE | INTRAVENOUS | Status: DC | PRN
  Administered 2020-02-24: 40 mg via INTRAVENOUS

## 2020-02-24 MED ORDER — DEXAMETHASONE SODIUM PHOSPHATE 10 MG/ML IJ SOLN
INTRAMUSCULAR | Status: AC
  Filled 2020-02-24: qty 1

## 2020-02-24 MED ORDER — CLINDAMYCIN PHOSPHATE 900 MG/50ML IV SOLN
900.0000 mg | INTRAVENOUS | Status: AC
  Administered 2020-02-24: 900 mg via INTRAVENOUS

## 2020-02-24 MED ORDER — OXYCODONE HCL 5 MG PO TABS
ORAL_TABLET | ORAL | Status: AC
  Administered 2020-02-24: 5 mg via ORAL
  Filled 2020-02-24: qty 1

## 2020-02-24 MED ORDER — LIDOCAINE HCL (PF) 1 % IJ SOLN
INTRAMUSCULAR | Status: AC
  Filled 2020-02-24: qty 30

## 2020-02-24 MED ORDER — IBUPROFEN 800 MG PO TABS
800.0000 mg | ORAL_TABLET | Freq: Three times a day (TID) | ORAL | 0 refills | Status: DC | PRN
Start: 2020-02-24 — End: 2020-02-24

## 2020-02-24 MED ORDER — ACETAMINOPHEN 500 MG PO TABS: 1000.0000 mg | ORAL_TABLET | ORAL | Status: AC

## 2020-02-24 MED ORDER — ACETAMINOPHEN 500 MG PO TABS
ORAL_TABLET | ORAL | Status: AC
  Administered 2020-02-24: 1000 mg via ORAL
  Filled 2020-02-24: qty 2

## 2020-02-24 MED ORDER — PROPOFOL 10 MG/ML IV BOLUS
INTRAVENOUS | Status: DC | PRN
Start: 2020-02-24 — End: 2020-02-24
  Administered 2020-02-24: 30 mg via INTRAVENOUS

## 2020-02-24 MED ORDER — EPHEDRINE 5 MG/ML INJ
INTRAVENOUS | Status: AC
  Filled 2020-02-24: qty 10

## 2020-02-24 MED ORDER — ORAL CARE MOUTH RINSE: 15.0000 mL | Freq: Once | OROMUCOSAL | Status: AC

## 2020-02-24 SURGICAL SUPPLY — 49 items
"PENCIL ELECTRO HAND CTR " (MISCELLANEOUS) IMPLANT
BLADE SURG 15 STRL LF DISP TIS (BLADE) ×1 IMPLANT
BLADE SURG 15 STRL SS (BLADE) ×2
BLADE SURG SZ11 CARB STEEL (BLADE) ×3 IMPLANT
CNTNR SPEC 2.5X3XGRAD LEK (MISCELLANEOUS) ×1
CONT SPEC 4OZ STER OR WHT (MISCELLANEOUS) ×2
CONTAINER SPEC 2.5X3XGRAD LEK (MISCELLANEOUS) IMPLANT
COTTON BALL STRL MEDIUM (GAUZE/BANDAGES/DRESSINGS) ×3 IMPLANT
COVER WAND RF STERILE (DRAPES) ×3 IMPLANT
DERMABOND ADVANCED (GAUZE/BANDAGES/DRESSINGS) ×2
DERMABOND ADVANCED .7 DNX12 (GAUZE/BANDAGES/DRESSINGS) ×1 IMPLANT
DRAPE LAPAROTOMY 77X122 PED (DRAPES) ×3 IMPLANT
DRSG TELFA 4X3 1S NADH ST (GAUZE/BANDAGES/DRESSINGS) ×3 IMPLANT
ELECT CAUTERY BLADE TIP 2.5 (TIP) ×3
ELECT CAUTERY NDL 2.0 MIC (NEEDLE) IMPLANT
ELECT CAUTERY NEEDLE 2.0 MIC (NEEDLE) ×3 IMPLANT
ELECT REM PT RETURN 9FT ADLT (ELECTROSURGICAL) ×3
ELECTRODE CAUTERY BLDE TIP 2.5 (TIP) ×1 IMPLANT
ELECTRODE REM PT RTRN 9FT ADLT (ELECTROSURGICAL) ×1 IMPLANT
GLOVE BIOGEL PI IND STRL 7.0 (GLOVE) ×1 IMPLANT
GLOVE BIOGEL PI INDICATOR 7.0 (GLOVE) ×2
GLOVE SURG SYN 6.5 ES PF (GLOVE) ×3 IMPLANT
GLOVE SURG SYN 6.5 PF PI (GLOVE) ×1 IMPLANT
GOWN STRL REUS W/ TWL LRG LVL3 (GOWN DISPOSABLE) ×2 IMPLANT
GOWN STRL REUS W/TWL LRG LVL3 (GOWN DISPOSABLE) ×4
KIT TURNOVER KIT A (KITS) ×3 IMPLANT
LABEL OR SOLS (LABEL) ×3 IMPLANT
NDL HYPO 25X1 1.5 SAFETY (NEEDLE) IMPLANT
NEEDLE HYPO 25X1 1.5 SAFETY (NEEDLE) IMPLANT
NS IRRIG 500ML POUR BTL (IV SOLUTION) ×3 IMPLANT
PACK BASIN MINOR (MISCELLANEOUS) ×3 IMPLANT
PENCIL ELECTRO HAND CTR (MISCELLANEOUS) ×2 IMPLANT
SOL PREP PVP 2OZ (MISCELLANEOUS) ×3
SOLUTION PREP PVP 2OZ (MISCELLANEOUS) ×1 IMPLANT
SUCTION FRAZIER HANDLE 10FR (MISCELLANEOUS) ×2
SUCTION TUBE FRAZIER 10FR DISP (MISCELLANEOUS) ×1 IMPLANT
SUT MNCRL AB 4-0 PS2 18 (SUTURE) ×3 IMPLANT
SUT SILK 2 0 (SUTURE)
SUT SILK 2-0 18XBRD TIE 12 (SUTURE) IMPLANT
SUT SILK 3 0 (SUTURE) ×2
SUT SILK 3-0 18XBRD TIE 12 (SUTURE) ×1 IMPLANT
SUT SILK 4 0 (SUTURE)
SUT SILK 4-0 18XBRD TIE 12 (SUTURE) IMPLANT
SUT VIC AB 3-0 SH 27 (SUTURE)
SUT VIC AB 3-0 SH 27X BRD (SUTURE) IMPLANT
SYR 10ML LL (SYRINGE) IMPLANT
SYR BULB IRRIG 60ML STRL (SYRINGE) ×3 IMPLANT
TUBING CONNECTING 10 (TUBING) ×1 IMPLANT
TUBING CONNECTING 10' (TUBING) ×1

## 2020-02-24 NOTE — Transfer of Care (Signed)
Immediate Anesthesia Transfer of Care Note  Patient: Anne Steele  Procedure(s) Performed: BIOPSY TEMPORAL ARTERY (N/A Head)  Patient Location: PACU  Anesthesia Type:General  Level of Consciousness: drowsy  Airway & Oxygen Therapy: Patient Spontanous Breathing and Patient connected to face mask oxygen  Post-op Assessment: Report given to RN and Post -op Vital signs reviewed and stable  Post vital signs: Reviewed and stable  Last Vitals:  Vitals Value Taken Time  BP 142/67 02/24/20 1625  Temp 36.3 C 02/24/20 1610  Pulse 66 02/24/20 1627  Resp 19 02/24/20 1627  SpO2 100 % 02/24/20 1627  Vitals shown include unvalidated device data.  Last Pain:  Vitals:   02/24/20 1624  TempSrc:   PainSc: 6          Complications: No apparent anesthesia complications

## 2020-02-24 NOTE — Anesthesia Preprocedure Evaluation (Signed)
Anesthesia Evaluation  Patient identified by MRN, date of birth, ID band Patient awake    Reviewed: Allergy & Precautions, H&P , NPO status , Patient's Chart, lab work & pertinent test results  History of Anesthesia Complications Negative for: history of anesthetic complications  Airway Mallampati: III  TM Distance: <3 FB Neck ROM: limited    Dental  (+) Chipped   Pulmonary neg shortness of breath, asthma ,    Pulmonary exam normal        Cardiovascular Exercise Tolerance: Good hypertension, + dysrhythmias Atrial Fibrillation      Neuro/Psych  Headaches, PSYCHIATRIC DISORDERS  Neuromuscular disease    GI/Hepatic Neg liver ROS, hiatal hernia, GERD  Medicated and Controlled,  Endo/Other  negative endocrine ROS  Renal/GU CRFRenal disease  negative genitourinary   Musculoskeletal   Abdominal   Peds  Hematology negative hematology ROS (+)   Anesthesia Other Findings Past Medical History: No date: Arthritis No date: Asthma 1973: Bell palsy No date: Bell's palsy No date: Benign bladder tumor No date: Depression No date: Fibromyalgia No date: GERD (gastroesophageal reflux disease) No date: Head ache No date: Hiatal hernia No date: History of kidney stones No date: Hypercalcemia No date: Hypertension No date: Kidney stones 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 Bary Castilla in office BENIGN EPIDERMAL INCLUSION CYST               WITH POSSIBLE RUPTURE AND ASSOCIATED INFLAMMATORY                RESPONSE 2012: [place]; Bilateral 2017: CATARACT EXTRACTION; Bilateral 2001: CHOLECYSTECTOMY No date: COLONOSCOPY No date: CYST EXCISION     Comment:  FINGER 08/29/2018: CYSTOSCOPY W/ RETROGRADES; Bilateral     Comment:  Procedure: CYSTOSCOPY WITH RETROGRADE PYELOGRAM;                Surgeon: Hollice Espy, MD;  Location: [facility] ORS;                Service: Urology;  Laterality: Bilateral; 08/29/2018: CYSTOSCOPY WITH BIOPSY; N/A     Comment:  Procedure: CYSTOSCOPY WITH Bladder BIOPSY;  Surgeon:               Hollice Espy, MD;  Location: [facility] ORS;  Service:               Urology;  Laterality: N/A; No date: ESOPHAGOGASTRODUODENOSCOPY No date: EYE SURGERY 2012: KNEE SURGERY; Left No date: REFRACTIVE SURGERY; Bilateral No date: SIGMOIDOSCOPY  BMI    Body Mass Index: 32.33 kg/m      Reproductive/Obstetrics negative OB ROS                             Anesthesia Physical Anesthesia Plan  ASA: III  Anesthesia Plan: General   Post-op Pain Management:    Induction: Intravenous  PONV Risk Score and Plan: Propofol infusion and TIVA  Airway Management Planned: Natural Airway and Nasal Cannula  Additional Equipment:   Intra-op Plan:  Post-operative Plan:   Informed Consent: I have reviewed the patients History and Physical, chart, labs and discussed the procedure including the risks, benefits and alternatives for the proposed anesthesia with the patient or authorized representative who has indicated his/her understanding and acceptance.     Dental Advisory Given  Plan Discussed with: Anesthesiologist, CRNA and Surgeon  Anesthesia Plan Comments: (Patient consented for risks of anesthesia including but not limited to:  - adverse reactions to medications - risk of intubation if required - damage to eyes, teeth, lips or other oral mucosa - nerve damage due to positioning  - sore throat or hoarseness - Damage to heart, brain,  nerves, lungs, other parts of body or loss of life  Patient voiced understanding.)        Anesthesia Quick Evaluation

## 2020-02-24 NOTE — Anesthesia Postprocedure Evaluation (Signed)
Anesthesia Post Note  Patient: Anne Steele  Procedure(s) Performed: BIOPSY TEMPORAL ARTERY (N/A Head)  Patient location during evaluation: PACU Anesthesia Type: General Level of consciousness: awake and alert Pain management: pain level controlled Vital Signs Assessment: post-procedure vital signs reviewed and stable Respiratory status: spontaneous breathing and respiratory function stable Cardiovascular status: stable Anesthetic complications: no     Last Vitals:  Vitals:   02/24/20 1625 02/24/20 1631  BP: (!) 142/67   Pulse: 73 68  Resp: 16 17  Temp:    SpO2: 99% 97%    Last Pain:  Vitals:   02/24/20 1636  TempSrc:   PainSc: Asleep    LLE Motor Response: Purposeful movement (02/24/20 1631) LLE Sensation: Full sensation (02/24/20 1631)   RLE Sensation: Full sensation (02/24/20 1631)      Dennard Nip K

## 2020-02-24 NOTE — Discharge Instructions (Addendum)
Biopsy, Care After This sheet gives you information about how to care for yourself after your procedure. Your health care provider may also give you more specific instructions. If you have problems or questions, contact your health care provider. What can I expect after the procedure? After the procedure, it is common to have:  Soreness.  Bruising.  Itching. Follow these instructions at home: site care Follow instructions from your health care provider about how to take care of your site. Make sure you:  Wash your hands with soap and water before and after you change your bandage (dressing). If soap and water are not available, use hand sanitizer.  Leave stitches (sutures), skin glue, or adhesive strips in place. These skin closures may need to stay in place for 2 weeks or longer. If adhesive strip edges start to loosen and curl up, you may trim the loose edges. Do not remove adhesive strips completely unless your health care provider tells you to do that.  If the area bleeds or bruises, apply gentle pressure for 10 minutes.  OK TO SHOWER IN 24HRS  Check your site every day for signs of infection. Check for:  Redness, swelling, or pain.  Fluid or blood.  Warmth.  Pus or a bad smell.  General instructions  Rest and then return to your normal activities as told by your health care provider. .  tylenol and advil as needed for discomfort.  Please alternate between the two every four hours as needed for pain.   .  Use narcotics, if prescribed, only when tylenol and motrin is not enough to control pain. .  325-650mg  every 8hrs to max of 3000mg /24hrs (including the 325mg  in every norco dose) for the tylenol.   .  Advil up to 400mg  per dose every 8hrs as needed for pain.    Keep all follow-up visits as told by your health care provider. This is important. Contact a health care provider if:  You have redness, swelling, or pain around your site.  You have fluid or blood coming from  your site.  Your site feels warm to the touch.  You have pus or a bad smell coming from your site.  You have a fever.  Your sutures, skin glue, or adhesive strips loosen or come off sooner than expected. Get help right away if:  You have bleeding that does not stop with pressure or a dressing. Summary  After the procedure, it is common to have some soreness, bruising, and itching at the site.  Follow instructions from your health care provider about how to take care of your site.  Check your site every day for signs of infection.  Contact a health care provider if you have redness, swelling, or pain around your site, or your site feels warm to the touch.  Keep all follow-up visits as told by your health care provider. This is important. This information is not intended to replace advice given to you by your health care provider. Make sure you discuss any questions you have with your health care provider. Document Released: 10/02/2015 Document Revised: 03/05/2018 Document Reviewed: 03/05/2018 Elsevier Interactive Patient Education  2019 [place]   1) The drugs that you were given will stay in your system until tomorrow so for the next 24 hours you should not:  A) Drive an automobile B) Make any legal decisions C) Drink any alcoholic beverage   2) You may resume regular meals tomorrow.  Today it is better to start with liquids and gradually work up to solid foods.  You may eat anything you prefer, but it is better to start with liquids, then soup and crackers, and gradually work up to solid foods.   3) Please notify your doctor immediately if you have any unusual bleeding, trouble breathing, redness and pain at the surgery site, drainage, fever, or pain not relieved by medication.    4) Additional Instructions:        Please contact your physician with any problems or Same Day Surgery at [phone], Monday  through Friday 6 am to 4 pm, or  at [facility] number at [phone].

## 2020-02-24 NOTE — Interval H&P Note (Signed)
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  The various methods of treatment have been discussed with the patient and family. After consideration of risks, benefits and other options for treatment, the patient has consented to  Procedure(s): BIOPSY TEMPORAL ARTERY (N/A) as a surgical intervention.  The patient's history has been reviewed, patient examined, no change in status, stable for surgery.  I have reviewed the patient's chart and labs.  Questions were answered to the patient's satisfaction.     Triston Lisanti Lysle Pearl

## 2020-02-24 NOTE — Op Note (Signed)
Pre-Op Dx: headache Post-Op Dx: same Anesthesia: MAC EBL: minimal Complications:  none apparent Specimen: left temporal artery Procedure: excisional biopsy of LEFT temporal artery Surgeon: Lysle Pearl  Indication for procedure: Patient presenting with symptoms above concerning for possible temporal arteritis surgery requested for biopsy to aid in diagnosis.  Description of Procedure:  Patient willing to the OR, transferred to the OR table in supine position.  Preoperative antibiotics given.  Area sterilized and draped in usual position.  Timeout performed.   Local infused to area previously marked after confirming palpable temporal artery as well as using Doppler.  4cm incision made through dermis with 15blade and the palpable temporal artery was noted overlying the fascia.  Dissection continued to obtain adequate specimen length which in the end resulted in roughly 3 cm.  The 2 ends were then clamped and then ligated and the specimen was passed off the operative field pending pathology.  2 ends were then suture ligated with 4-0 silk.  Prior to the removal of the biopsy specimen, any branching arteries were also suture-ligated with 4-0 silk.  Wound hemostasis noted, then skin closed with 3-0 vicyl for deep dermal, then running 4-0 monocryl in subcuticular fashion.  Wound then dressed with dermabond.  Pt tolerated procedure well, and transferred to PACU in stable condition. Sponge and instrument count correct at end of procedure.

## 2020-02-27 LAB — SURGICAL PATHOLOGY

## 2020-03-13 ENCOUNTER — Other Ambulatory Visit: Payer: Self-pay | Admitting: *Deleted

## 2020-03-13 DIAGNOSIS — R300 Dysuria: Secondary | ICD-10-CM

## 2020-03-15 NOTE — Progress Notes (Signed)
03/16/2020 2:48 PM   Anne Steele 1941/09/14 [id number]  Referring provider: Tracie Harrier, MD [address] [facility] [place],  [place] [address]  Chief Complaint  Patient presents with  . Acute Visit    trouble holding urine    HPI: Anne Steele is a 79 year old female dysuria, urge incontinence and vaginal atrophy who presents today for trouble holding her urine.  Dysuria Cystoscopy 03/2019 with Dr. Erlene Quan was NED.    Urge incontinence Patient states that she has had urinary incontinence for a few years.  She is having urge incontinence, but she is not having SUI.  She is wearing pads daily.  Her incontinence volume is large.   She is wearing one to pads pads/depends daily.  Patient denies any modifying or aggravating factors.  Patient denies any gross hematuria, dysuria or suprapubic/flank pain.  Patient denies any fevers, chills, nausea or vomiting.  She is post menopausal.  She is drinking five glasses of water daily.   She is drinking 2 to 3 Dr. Genia Harold daily.  She is drinking sweet tea as well.  She is not drinking alcoholic beverages daily.  PVR is 27 mL.  She is taking oxybutynin 5 mg BID.  She feels it helps.    Vaginal atrophy She is applying the vaginal cream as regular as she can.    PMH: Past Medical History:  Diagnosis Date  . Arthritis   . Asthma   . Bell palsy 1973  . Bell's palsy   . Benign bladder tumor   . Depression   . Fibromyalgia   . GERD (gastroesophageal reflux disease)   . Head ache   . Hiatal hernia   . History of kidney stones   . Hypercalcemia   . Hypertension   . Kidney stones   . Optic nerve disease   . Shingles   . Vitamin D deficiency     Surgical History: Past Surgical History:  Procedure Laterality Date  . ABDOMINAL HYSTERECTOMY  1982  . APPENDECTOMY    . ARTERY BIOPSY N/A 02/24/2020   Procedure: BIOPSY TEMPORAL ARTERY;  Surgeon: Benjamine Sprague, DO;  Location: [facility] ORS;  Service: General;   Laterality: N/A;  . BLADDER TUMOR EXCISION  08/2018  . BREAST BIOPSY Right 03/04/2016   US biopsy/ INTRADUCTAL PAPILLOMA  . BREAST BIOPSY Left 03/04/2016   Fibroadenoma  . BREAST EXCISIONAL BIOPSY Left 03/05/2019   Inflamed sebaceous cyst of the left breast, excised by dr. Bary Castilla in office BENIGN EPIDERMAL INCLUSION CYST WITH POSSIBLE RUPTURE AND ASSOCIATED INFLAMMATORY RESPONSE  . CARPAL TUNNEL RELEASE Bilateral 2012  . CATARACT EXTRACTION Bilateral 2017  . CHOLECYSTECTOMY  2001  . COLONOSCOPY    . CYST EXCISION     FINGER  . CYSTOSCOPY W/ RETROGRADES Bilateral 08/29/2018   Procedure: CYSTOSCOPY WITH RETROGRADE PYELOGRAM;  Surgeon: Hollice Espy, MD;  Location: [facility] ORS;  Service: Urology;  Laterality: Bilateral;  . CYSTOSCOPY WITH BIOPSY N/A 08/29/2018   Procedure: CYSTOSCOPY WITH Bladder BIOPSY;  Surgeon: Hollice Espy, MD;  Location: [facility] ORS;  Service: Urology;  Laterality: N/A;  . ESOPHAGOGASTRODUODENOSCOPY    . EYE SURGERY    . KNEE SURGERY Left 2012  . REFRACTIVE SURGERY Bilateral   . SIGMOIDOSCOPY      Home Medications:  Allergies as of 03/16/2020      Reactions   Ivp Dye [iodinated Diagnostic Agents] Anaphylaxis   Penicillins Shortness Of Breath   Patient states it "brings her asthma back on"  Duloxetine Other (See Comments)   nightmares   Iodine Itching   Latanoprost    Blood shot eyes   Tetracyclines & Related Swelling   Trusopt [dorzolamide Hcl]    Zocor [simvastatin]    MUSCLE CRAMPS   Zovirax [acyclovir]    Betadine [povidone Iodine] Rash   Macrobid [nitrofurantoin] Rash   Sulfa Antibiotics Rash   Tape Rash      Medication List       Accurate as of March 16, 2020  2:48 PM. If you have any questions, ask your nurse or doctor.        acetaminophen 500 MG tablet Commonly known as: TYLENOL Take 1,000 mg by mouth 2 (two) times daily.   acetaminophen 325 MG tablet Commonly known as: Tylenol Take 2 tablets (650 mg total) by mouth every 8 (eight)  hours as needed for mild pain.   allopurinol 100 MG tablet Commonly known as: ZYLOPRIM Take 100 mg by mouth daily.   busPIRone 10 MG tablet Commonly known as: BUSPAR Take 10 mg by mouth 2 (two) times daily.   cyanocobalamin 1000 MCG tablet Take 100 mcg by mouth daily.   enalapril 20 MG tablet Commonly known as: VASOTEC Take 20 mg by mouth daily.   fluticasone 50 MCG/ACT nasal spray Commonly known as: FLONASE Place 2 sprays into both nostrils daily.   HYDROcodone-acetaminophen 5-325 MG tablet Commonly known as: Norco Take 1 tablet by mouth every 6 (six) hours as needed for up to 6 doses for moderate pain.   ibuprofen 400 MG tablet Commonly known as: ADVIL Take 1 tablet (400 mg total) by mouth every 8 (eight) hours as needed for mild pain or moderate pain.   loratadine 10 MG tablet Commonly known as: CLARITIN Take 10 mg by mouth at bedtime.   magnesium oxide 400 (241.3 Mg) MG tablet Commonly known as: MAG-OX Take 400 mg by mouth daily.   metoprolol succinate 25 MG 24 hr tablet Commonly known as: TOPROL-XL Take 25 mg by mouth daily.   omeprazole 20 MG capsule Commonly known as: PRILOSEC Take 20 mg by mouth 2 (two) times daily before a meal.   oxybutynin 5 MG tablet Commonly known as: DITROPAN Take 1 tablet by mouth daily as needed.   Systane Nighttime Oint Apply 1 application to eye at bedtime. Both eyes   tiZANidine 4 MG tablet Commonly known as: ZANAFLEX Take 4 mg by mouth every 8 (eight) hours as needed for muscle spasms.   topiramate 25 MG tablet Commonly known as: TOPAMAX Take 25 mg by mouth daily.   Travoprost (BAK Free) 0.004 % Soln ophthalmic solution Commonly known as: TRAVATAN Place 1 drop into both eyes at bedtime.   vitamin E 180 MG (400 UNITS) capsule Generic drug: vitamin E Take 400 Units by mouth daily.       Allergies:  Allergies  Allergen Reactions  . Ivp Dye [Iodinated Diagnostic Agents] Anaphylaxis  . Penicillins Shortness Of  Breath    Patient states it "brings her asthma back on"  . Duloxetine Other (See Comments)    nightmares  . Iodine Itching  . Latanoprost     Blood shot eyes  . Tetracyclines & Related Swelling  . Trusopt [Dorzolamide Hcl]   . Zocor [Simvastatin]     MUSCLE CRAMPS  . Zovirax [Acyclovir]   . Betadine [Povidone Iodine] Rash  . Macrobid [Nitrofurantoin] Rash  . Sulfa Antibiotics Rash  . Tape Rash    Family History: Family History  Problem Relation Age  of Onset  . Leukemia Mother   . Diabetes Father   . Breast cancer Sister   . Brain cancer Brother   . Diabetes Sister   . Diabetes Brother   . Prostate cancer Neg Hx   . Kidney cancer Neg Hx   . Bladder Cancer Neg Hx     Social History:  reports that she has never smoked. She has never used smokeless tobacco. She reports that she does not drink alcohol and does not use drugs.  ROS: Pertinent ROS in HPI  Physical Exam: BP (!) 165/90   Pulse (!) 112   Ht 5' (1.524 m)   Wt 163 lb (73.9 kg)   BMI 31.83 kg/m   Constitutional:  Well nourished. Alert and oriented, No acute distress. HEENT: [place] AT, mask in place  Trachea midline Cardiovascular: No clubbing, cyanosis, or edema. Respiratory: Normal respiratory effort, no increased work of breathing. Neurologic: Grossly intact, no focal deficits, moving all 4 extremities. Psychiatric: Normal mood and affect.   Laboratory Data: Lab Results  Component Value Date   WBC 17.4 (H) 02/21/2020   HGB 12.9 02/21/2020   HCT 39.9 02/21/2020   MCV 95.5 02/21/2020   PLT 235 02/21/2020    Lab Results  Component Value Date   CREATININE 1.10 (H) 02/21/2020    Lab Results  Component Value Date   AST 21 08/26/2015   Lab Results  Component Value Date   ALT 14 08/26/2015    Urinalysis Component     Latest Ref Rng & Units 03/16/2020  Color, Urine     YELLOW STRAW (A)  Appearance     CLEAR CLEAR  Specific Gravity, Urine     1.005 - 1.030 1.015  pH     5.0 - 8.0 7.0    Glucose, UA     NEGATIVE mg/dL NEGATIVE  Hgb urine dipstick     NEGATIVE NEGATIVE  Bilirubin Urine     NEGATIVE NEGATIVE  Ketones, ur     NEGATIVE mg/dL NEGATIVE  Protein     NEGATIVE mg/dL NEGATIVE  Nitrite     NEGATIVE NEGATIVE  Leukocytes,Ua     NEGATIVE NEGATIVE  Squamous Epithelial / LPF     0 - 5 0-5  WBC, UA     0 - 5 WBC/hpf 0-5  RBC / HPF     0 - 5 RBC/hpf NONE SEEN  Bacteria, UA     NONE SEEN NONE SEEN   I have reviewed the labs.   Pertinent Imaging: Results for Anne, Steele (MRN [id number]) as of 03/16/2020 14:34  Ref. Range 03/16/2020 14:33  Scan Result Unknown 27 ml   I have independently reviewed the films.    Assessment & Plan:    1. Urge Incontinence Discussed behavioral therapies, bladder training and bladder control strategies Discussed fluid management  Discontinue oxybutynin as it is an anticholinergic and it is not recommended in individuals over the age of 29 due to cognitive effects Offered medical therapy with anticholinergic therapy or beta-3 adrenergic - would like to try the beta-3 adrenergic receptor agonist (Gemtesa 75 mg).  Given Gemtesa 75 mg samples, #28.  I have reviewed with the patient of the side effects of Myrbetriq, such as: elevation in BP, urinary retention and/or HA.  She will return in one month for PVR and symptom recheck.   RTC in 3 weeks for PVR and symptom recheck   2. Vaginal atrophy Continue applying vaginal estrogen cream 3 nights weekly   Return  in about 3 weeks (around 04/06/2020) for PVR and OAB questionnaire.  These notes generated with voice recognition software. I apologize for typographical errors.  Zara Council, PA-C  [facility] [address]  [place], [place] [address] [phone]

## 2020-03-16 ENCOUNTER — Other Ambulatory Visit
Admission: RE | Admit: 2020-03-16 | Discharge: 2020-03-16 | Disposition: A | Discharge status: Home / Self Care | Payer: Medicare Other | Attending: Urology | Admitting: Urology

## 2020-03-16 ENCOUNTER — Encounter: Payer: Self-pay | Admitting: Urology

## 2020-03-16 ENCOUNTER — Other Ambulatory Visit: Payer: Self-pay

## 2020-03-16 ENCOUNTER — Ambulatory Visit (INDEPENDENT_AMBULATORY_CARE_PROVIDER_SITE_OTHER): Payer: Medicare Other | Admitting: Urology

## 2020-03-16 VITALS — BP 165/90 | HR 112 | Ht 60.0 in | Wt 163.0 lb

## 2020-03-16 DIAGNOSIS — R300 Dysuria: Secondary | ICD-10-CM | POA: Insufficient documentation

## 2020-03-16 DIAGNOSIS — N3941 Urge incontinence: Secondary | ICD-10-CM

## 2020-03-16 DIAGNOSIS — N952 Postmenopausal atrophic vaginitis: Secondary | ICD-10-CM

## 2020-03-16 LAB — URINALYSIS, COMPLETE (UACMP) WITH MICROSCOPIC
Bacteria, UA: NONE SEEN
Bilirubin Urine: NEGATIVE
Glucose, UA: NEGATIVE mg/dL
Hgb urine dipstick: NEGATIVE
Ketones, ur: NEGATIVE mg/dL
Leukocytes,Ua: NEGATIVE
Nitrite: NEGATIVE
Protein, ur: NEGATIVE mg/dL
RBC / HPF: NONE SEEN RBC/hpf (ref 0–5)
Specific Gravity, Urine: 1.015 (ref 1.005–1.030)
pH: 7 (ref 5.0–8.0)

## 2020-03-16 LAB — BLADDER SCAN AMB NON-IMAGING: Scan Result: 27

## 2020-03-16 MED ORDER — GEMTESA 75 MG PO TABS: 75.0000 mg | ORAL_TABLET | Freq: Every day | ORAL | 0 refills | Status: DC

## 2020-03-18 ENCOUNTER — Ambulatory Visit: Payer: Medicare Other | Admitting: Urology

## 2020-04-05 NOTE — Progress Notes (Signed)
04/06/2020 3:01 PM   Anne Steele 1941/03/24 [id number]  Referring provider: Tracie Steele, [facility] [place],  [place] [address]  Chief Complaint  Patient presents with  . Follow-up    3 week follow-up    HPI: Anne Steele is a 79 year old female dysuria, urge incontinence and vaginal atrophy who presents today for after a trial of Gemtesa 75 mg daily.    Dysuria Cystoscopy 03/2019 with Anne Steele was NED.    Urge incontinence Patient states that she has had urinary incontinence for a few years.  She is having urge incontinence, but she is not having SUI.  She is wearing pads daily.  Her incontinence volume is large.   She is wearing one to pads pads/depends daily.  She is post menopausal.  She is drinking five glasses of water daily.   She is drinking 2 to 3 Anne Steele daily.  She is drinking sweet tea as well.  She is not drinking alcoholic beverages daily.   The patient is  experiencing urgency x 1-2, frequency x 4-7, not restricting fluids to avoid visits to the restroom, is engaging in toilet mapping, incontinence x 0-3 and nocturia x 0-3.   Her BP is 148/80.   Her PVR is 0 mL   Gemtesa 75 mg helped.    Vaginal atrophy She is applying the vaginal cream as regular as she can.    PMH: Past Medical History:  Diagnosis Date  . Arthritis   . Asthma   . Bell palsy 1973  . Bell's palsy   . Benign bladder tumor   . Depression   . Fibromyalgia   . GERD (gastroesophageal reflux disease)   . Head ache   . Hiatal hernia   . History of kidney stones   . Hypercalcemia   . Hypertension   . Kidney stones   . Optic nerve disease   . Shingles   . Vitamin D deficiency     Surgical History: Past Surgical History:  Procedure Laterality Date  . ABDOMINAL HYSTERECTOMY  1982  . APPENDECTOMY    . ARTERY BIOPSY N/A 02/24/2020   Procedure: BIOPSY TEMPORAL ARTERY;  Surgeon: Benjamine Sprague, DO;  Location: [facility] ORS;  Service: General;   Laterality: N/A;  . BLADDER TUMOR EXCISION  08/2018  . BREAST BIOPSY Right 03/04/2016   US biopsy/ INTRADUCTAL PAPILLOMA  . BREAST BIOPSY Left 03/04/2016   Fibroadenoma  . BREAST EXCISIONAL BIOPSY Left 03/05/2019   Inflamed sebaceous cyst of the left breast, excised by dr. Bary Castilla in office BENIGN EPIDERMAL INCLUSION CYST WITH POSSIBLE RUPTURE AND ASSOCIATED INFLAMMATORY RESPONSE  . CARPAL TUNNEL RELEASE Bilateral 2012  . CATARACT EXTRACTION Bilateral 2017  . CHOLECYSTECTOMY  2001  . COLONOSCOPY    . CYST EXCISION     FINGER  . CYSTOSCOPY W/ RETROGRADES Bilateral 08/29/2018   Procedure: CYSTOSCOPY WITH RETROGRADE PYELOGRAM;  Surgeon: Hollice Espy, MD;  Location: [facility] ORS;  Service: Urology;  Laterality: Bilateral;  . CYSTOSCOPY WITH BIOPSY N/A 08/29/2018   Procedure: CYSTOSCOPY WITH Bladder BIOPSY;  Surgeon: Hollice Espy, MD;  Location: [facility] ORS;  Service: Urology;  Laterality: N/A;  . ESOPHAGOGASTRODUODENOSCOPY    . EYE SURGERY    . KNEE SURGERY Left 2012  . REFRACTIVE SURGERY Bilateral   . SIGMOIDOSCOPY      Home Medications:  Allergies as of 04/06/2020      Reactions   Ivp Dye [iodinated Diagnostic Agents] Anaphylaxis   Penicillins Shortness  Of Breath   Patient states it "brings her asthma back on"   Duloxetine Other (See Comments)   nightmares   Iodine Itching   Latanoprost    Blood shot eyes   Tetracyclines & Related Swelling   Trusopt [dorzolamide Hcl]    Zocor [simvastatin]    MUSCLE CRAMPS   Zovirax [acyclovir]    Betadine [povidone Iodine] Rash   Macrobid [nitrofurantoin] Rash   Sulfa Antibiotics Rash   Tape Rash      Medication List       Accurate as of April 06, 2020  3:01 PM. If you have any questions, ask your nurse or doctor.        acetaminophen 500 MG tablet Commonly known as: TYLENOL Take 1,000 mg by mouth 2 (two) times daily.   allopurinol 100 MG tablet Commonly known as: ZYLOPRIM Take 100 mg by mouth daily.   busPIRone 10 MG  tablet Commonly known as: BUSPAR Take 10 mg by mouth 2 (two) times daily.   cyanocobalamin 1000 MCG tablet Take 100 mcg by mouth daily.   enalapril 20 MG tablet Commonly known as: VASOTEC Take 20 mg by mouth daily.   fluticasone 50 MCG/ACT nasal spray Commonly known as: FLONASE Place 2 sprays into both nostrils daily.   Gemtesa 75 MG Tabs Generic drug: Vibegron Take 75 mg by mouth daily. What changed: Another medication with the same name was added. Make sure you understand how and when to take each. Changed by: Zara Council, PA-C   Gemtesa 75 MG Tabs Generic drug: Vibegron Take 75 mg by mouth daily. What changed: You were already taking a medication with the same name, and this prescription was added. Make sure you understand how and when to take each. Changed by: Zara Council, PA-C   HYDROcodone-acetaminophen 5-325 MG tablet Commonly known as: Norco Take 1 tablet by mouth every 6 (six) hours as needed for up to 6 doses for moderate pain.   ibuprofen 400 MG tablet Commonly known as: ADVIL Take 1 tablet (400 mg total) by mouth every 8 (eight) hours as needed for mild pain or moderate pain.   loratadine 10 MG tablet Commonly known as: CLARITIN Take 10 mg by mouth at bedtime.   magnesium oxide 400 (241.3 Mg) MG tablet Commonly known as: MAG-OX Take 400 mg by mouth daily.   metoprolol succinate 25 MG 24 hr tablet Commonly known as: TOPROL-XL Take 25 mg by mouth daily.   omeprazole 20 MG capsule Commonly known as: PRILOSEC Take 20 mg by mouth 2 (two) times daily before a meal.   oxybutynin 5 MG tablet Commonly known as: DITROPAN Take 1 tablet by mouth daily as needed.   predniSONE 5 MG tablet Commonly known as: DELTASONE Take by mouth. As directed   Systane Nighttime Oint Apply 1 application to eye at bedtime. Both eyes   tiZANidine 4 MG tablet Commonly known as: ZANAFLEX Take 4 mg by mouth every 8 (eight) hours as needed for muscle spasms.    topiramate 25 MG tablet Commonly known as: TOPAMAX Take 25 mg by mouth daily.   Travoprost (BAK Free) 0.004 % Soln ophthalmic solution Commonly known as: TRAVATAN Place 1 drop into both eyes at bedtime.   vitamin E 180 MG (400 UNITS) capsule Generic drug: vitamin E Take 400 Units by mouth daily.       Allergies:  Allergies  Allergen Reactions  . Ivp Dye [Iodinated Diagnostic Agents] Anaphylaxis  . Penicillins Shortness Of Breath    Patient  states it "brings her asthma back on"  . Duloxetine Other (See Comments)    nightmares  . Iodine Itching  . Latanoprost     Blood shot eyes  . Tetracyclines & Related Swelling  . Trusopt [Dorzolamide Hcl]   . Zocor [Simvastatin]     MUSCLE CRAMPS  . Zovirax [Acyclovir]   . Betadine [Povidone Iodine] Rash  . Macrobid [Nitrofurantoin] Rash  . Sulfa Antibiotics Rash  . Tape Rash    Family History: Family History  Problem Relation Age of Onset  . Leukemia Mother   . Diabetes Father   . Breast cancer Sister   . Brain cancer Brother   . Diabetes Sister   . Diabetes Brother   . Prostate cancer Neg Hx   . Kidney cancer Neg Hx   . Bladder Cancer Neg Hx     Social History:  reports that she has never smoked. She has never used smokeless tobacco. She reports that she does not drink alcohol and does not use drugs.  ROS: Pertinent ROS in HPI  Physical Exam: BP (!) 148/80   Pulse (!) 108   Ht 5' (1.524 m)   Wt 171 lb (77.6 kg)   BMI 33.40 kg/m   Constitutional:  Well nourished. Alert and oriented, No acute distress. HEENT: [place] AT, mask in place.  Trachea midline Cardiovascular: No clubbing, cyanosis, or edema. Respiratory: Normal respiratory effort, no increased work of breathing. Neurologic: Grossly intact, no focal deficits, moving all 4 extremities. Psychiatric: Normal mood and affect.   Laboratory Data: Lab Results  Component Value Date   WBC 17.4 (H) 02/21/2020   HGB 12.9 02/21/2020   HCT 39.9 02/21/2020   MCV  95.5 02/21/2020   PLT 235 02/21/2020    Lab Results  Component Value Date   CREATININE 1.10 (H) 02/21/2020    Lab Results  Component Value Date   AST 21 08/26/2015   Lab Results  Component Value Date   ALT 14 08/26/2015    Urinalysis Component     Latest Ref Rng & Units 03/16/2020  Color, Urine     YELLOW STRAW (A)  Appearance     CLEAR CLEAR  Specific Gravity, Urine     1.005 - 1.030 1.015  pH     5.0 - 8.0 7.0  Glucose, UA     NEGATIVE mg/dL NEGATIVE  Hgb urine dipstick     NEGATIVE NEGATIVE  Bilirubin Urine     NEGATIVE NEGATIVE  Ketones, ur     NEGATIVE mg/dL NEGATIVE  Protein     NEGATIVE mg/dL NEGATIVE  Nitrite     NEGATIVE NEGATIVE  Leukocytes,Ua     NEGATIVE NEGATIVE  Squamous Epithelial / LPF     0 - 5 0-5  WBC, UA     0 - 5 WBC/hpf 0-5  RBC / HPF     0 - 5 RBC/hpf NONE SEEN  Bacteria, UA     NONE SEEN NONE SEEN   I have reviewed the labs.   Pertinent Imaging: Results for TATYM, SCHERMER (MRN [id number]) as of 04/06/2020 15:00  Ref. Range 04/06/2020 14:46  Scan Result Unknown 0 ml    Assessment & Plan:    1. Urge Incontinence Gemtesa 75 mg was helpful in controlling her incontinence RTC in 3 months for PVR and symptom recheck   2. Vaginal atrophy Continue applying vaginal estrogen cream 3 nights weekly   Return in about 3 months (around 07/07/2020) for PVR and OAB questionnaire.  These notes generated with voice recognition software. I apologize for typographical errors.  Zara Council, PA-C  [facility]"{';p=/ [address]  [place], [place] [address] [phone]

## 2020-04-06 ENCOUNTER — Encounter: Payer: Self-pay | Admitting: Urology

## 2020-04-06 ENCOUNTER — Other Ambulatory Visit: Payer: Self-pay

## 2020-04-06 ENCOUNTER — Ambulatory Visit (INDEPENDENT_AMBULATORY_CARE_PROVIDER_SITE_OTHER): Payer: Medicare Other | Admitting: Urology

## 2020-04-06 VITALS — BP 148/80 | HR 108 | Ht 60.0 in | Wt 171.0 lb

## 2020-04-06 DIAGNOSIS — N952 Postmenopausal atrophic vaginitis: Secondary | ICD-10-CM

## 2020-04-06 DIAGNOSIS — N3941 Urge incontinence: Secondary | ICD-10-CM | POA: Diagnosis not present

## 2020-04-06 LAB — BLADDER SCAN AMB NON-IMAGING: Scan Result: 0

## 2020-04-06 MED ORDER — GEMTESA 75 MG PO TABS: 75.0000 mg | ORAL_TABLET | Freq: Every day | ORAL | 11 refills | Status: DC

## 2020-05-01 ENCOUNTER — Other Ambulatory Visit: Payer: Self-pay

## 2020-05-01 ENCOUNTER — Ambulatory Visit
Admission: RE | Admit: 2020-05-01 | Discharge: 2020-05-01 | Disposition: A | Discharge status: Home / Self Care | Payer: Medicare Other | Source: Ambulatory Visit | Attending: Internal Medicine | Admitting: Internal Medicine

## 2020-05-01 DIAGNOSIS — Z1231 Encounter for screening mammogram for malignant neoplasm of breast: Secondary | ICD-10-CM | POA: Diagnosis not present

## 2020-05-19 ENCOUNTER — Ambulatory Visit: Payer: Medicare Other | Admitting: Urology

## 2020-06-30 ENCOUNTER — Other Ambulatory Visit: Payer: Self-pay | Admitting: Urology

## 2020-07-06 ENCOUNTER — Ambulatory Visit: Payer: Medicare Other | Admitting: Urology

## 2020-07-12 NOTE — Progress Notes (Signed)
07/13/2020 1:22 PM   Anne Steele 22-Apr-1941 [id number]  Referring provider: Tracie Harrier, [facility] [place],  [place] [address]  Chief Complaint  Patient presents with  . Urinary Incontinence    58mo w/PVR    HPI: Anne Steele is a 79 year old female dysuria, urge incontinence and vaginal atrophy who presents today for after a trial of Gemtesa 75 mg daily.    Dysuria Cystoscopy 03/2019 with Dr. Erlene Quan was NED.    Urge incontinence The patient is  experiencing urgency x 0-3 (stable), frequency x 4-7, is restricting fluids to avoid visits to the restroom, is engaging in toilet mapping, incontinence x 4-7 (worse) and nocturia x 0-3 (stable).   Her BP is 167/101.   Her PVR is 64 mL   Gemtesa 75 mg is no longer effective.  She states when she waits too long to urinate, she will have urge incontinence.  She states her water is bad, it is well water.  She drinks tea, coffee and Dr. Marilynn Latino.  She states that she is only going to the restroom 2 or 3 times a day.  She states she does not have to get up at night or have incontinence at night.   Patient denies any modifying or aggravating factors.  Patient denies any gross hematuria or suprapubic/flank pain.  Patient denies any fevers, chills, nausea or vomiting.   Vaginal atrophy She is applying the vaginal cream as regular as she can.    She has been experiencing burning in her vaginal area for the last several weeks.   Her UA is negative.    PMH: Past Medical History:  Diagnosis Date  . Arthritis   . Asthma   . Bell palsy 1973  . Bell's palsy   . Benign bladder tumor   . Depression   . Fibromyalgia   . GERD (gastroesophageal reflux disease)   . Head ache   . Hiatal hernia   . History of kidney stones   . Hypercalcemia   . Hypertension   . Kidney stones   . Optic nerve disease   . Shingles   . Vitamin D deficiency     Surgical History: Past Surgical History:  Procedure  Laterality Date  . ABDOMINAL HYSTERECTOMY  1982  . APPENDECTOMY    . ARTERY BIOPSY N/A 02/24/2020   Procedure: BIOPSY TEMPORAL ARTERY;  Surgeon: Benjamine Sprague, DO;  Location: [facility] ORS;  Service: General;  Laterality: N/A;  . BLADDER TUMOR EXCISION  08/2018  . BREAST BIOPSY Right 03/04/2016   US biopsy/ INTRADUCTAL PAPILLOMA  . BREAST BIOPSY Left 03/04/2016   Fibroadenoma  . BREAST EXCISIONAL BIOPSY Left 03/05/2019   Inflamed sebaceous cyst of the left breast, excised by dr. Bary Castilla in office BENIGN EPIDERMAL INCLUSION CYST WITH POSSIBLE RUPTURE AND ASSOCIATED INFLAMMATORY RESPONSE  . CARPAL TUNNEL RELEASE Bilateral 2012  . CATARACT EXTRACTION Bilateral 2017  . CHOLECYSTECTOMY  2001  . COLONOSCOPY    . CYST EXCISION     FINGER  . CYSTOSCOPY W/ RETROGRADES Bilateral 08/29/2018   Procedure: CYSTOSCOPY WITH RETROGRADE PYELOGRAM;  Surgeon: Hollice Espy, MD;  Location: [facility] ORS;  Service: Urology;  Laterality: Bilateral;  . CYSTOSCOPY WITH BIOPSY N/A 08/29/2018   Procedure: CYSTOSCOPY WITH Bladder BIOPSY;  Surgeon: Hollice Espy, MD;  Location: [facility] ORS;  Service: Urology;  Laterality: N/A;  . ESOPHAGOGASTRODUODENOSCOPY    . EYE SURGERY    . KNEE SURGERY Left 2012  . REFRACTIVE SURGERY Bilateral   .  SIGMOIDOSCOPY      Home Medications:  Allergies as of 07/13/2020      Reactions   Ivp Dye [iodinated Diagnostic Agents] Anaphylaxis   Penicillins Shortness Of Breath   Patient states it "brings her asthma back on"   Duloxetine Other (See Comments)   nightmares   Iodine Itching   Latanoprost    Blood shot eyes   Tetracyclines & Related Swelling   Trusopt [dorzolamide Hcl]    Zocor [simvastatin]    MUSCLE CRAMPS   Zovirax [acyclovir]    Betadine [povidone Iodine] Rash   Macrobid [nitrofurantoin] Rash   Sulfa Antibiotics Rash   Tape Rash      Medication List       Accurate as of July 13, 2020  1:22 PM. If you have any questions, ask your nurse or doctor.        STOP  taking these medications   HYDROcodone-acetaminophen 5-325 MG tablet Commonly known as: Norco Stopped by: Zara Council, PA-C     TAKE these medications   acetaminophen 500 MG tablet Commonly known as: TYLENOL Take 1,000 mg by mouth 2 (two) times daily.   allopurinol 100 MG tablet Commonly known as: ZYLOPRIM Take 100 mg by mouth daily.   busPIRone 10 MG tablet Commonly known as: BUSPAR Take 10 mg by mouth 2 (two) times daily.   cyanocobalamin 1000 MCG tablet Take 100 mcg by mouth daily.   enalapril 20 MG tablet Commonly known as: VASOTEC Take 20 mg by mouth daily.   fluticasone 50 MCG/ACT nasal spray Commonly known as: FLONASE Place 2 sprays into both nostrils daily.   gabapentin 100 MG capsule Commonly known as: NEURONTIN Take by mouth.   Gemtesa 75 MG Tabs Generic drug: Vibegron Take 75 mg by mouth daily. What changed: Another medication with the same name was removed. Continue taking this medication, and follow the directions you see here. Changed by: Zara Council, PA-C   ibuprofen 400 MG tablet Commonly known as: ADVIL Take 1 tablet (400 mg total) by mouth every 8 (eight) hours as needed for mild pain or moderate pain.   loratadine 10 MG tablet Commonly known as: CLARITIN Take 10 mg by mouth at bedtime.   magnesium oxide 400 (241.3 Mg) MG tablet Commonly known as: MAG-OX Take 400 mg by mouth daily.   metoprolol succinate 25 MG 24 hr tablet Commonly known as: TOPROL-XL Take 25 mg by mouth daily.   omeprazole 20 MG capsule Commonly known as: PRILOSEC Take 20 mg by mouth 2 (two) times daily before a meal.   oxybutynin 5 MG tablet Commonly known as: DITROPAN Take 1 tablet by mouth daily as needed.   pantoprazole 40 MG tablet Commonly known as: PROTONIX Take 40 mg by mouth daily.   predniSONE 5 MG tablet Commonly known as: DELTASONE Take 5 mg by mouth daily.   predniSONE 1 MG tablet Commonly known as: DELTASONE Take 4 mg by mouth daily.    Premarin vaginal cream Generic drug: conjugated estrogens USE PEA SIZED AMOUNT VAGINALLY ON MONDAY, WEDNESDAY AND FRIDAY ONLY BEFORE BEDTIME.   Systane Nighttime Oint Apply 1 application to eye at bedtime. Both eyes   tiZANidine 4 MG tablet Commonly known as: ZANAFLEX Take 4 mg by mouth every 8 (eight) hours as needed for muscle spasms.   topiramate 25 MG tablet Commonly known as: TOPAMAX Take 25 mg by mouth daily.   Travoprost (BAK Free) 0.004 % Soln ophthalmic solution Commonly known as: TRAVATAN Place 1 drop into both  eyes at bedtime.   vitamin E 180 MG (400 UNITS) capsule Generic drug: vitamin E Take 400 Units by mouth daily.       Allergies:  Allergies  Allergen Reactions  . Ivp Dye [Iodinated Diagnostic Agents] Anaphylaxis  . Penicillins Shortness Of Breath    Patient states it "brings her asthma back on"  . Duloxetine Other (See Comments)    nightmares  . Iodine Itching  . Latanoprost     Blood shot eyes  . Tetracyclines & Related Swelling  . Trusopt [Dorzolamide Hcl]   . Zocor [Simvastatin]     MUSCLE CRAMPS  . Zovirax [Acyclovir]   . Betadine [Povidone Iodine] Rash  . Macrobid [Nitrofurantoin] Rash  . Sulfa Antibiotics Rash  . Tape Rash    Family History: Family History  Problem Relation Age of Onset  . Leukemia Mother   . Diabetes Father   . Breast cancer Sister   . Brain cancer Brother   . Diabetes Sister   . Diabetes Brother   . Prostate cancer Neg Hx   . Kidney cancer Neg Hx   . Bladder Cancer Neg Hx     Social History:  reports that she has never smoked. She has never used smokeless tobacco. She reports that she does not drink alcohol and does not use drugs.  ROS: Pertinent ROS in HPI  Physical Exam: BP (!) 167/101   Pulse (!) 106   Ht 4\' 11"  (1.499 m)   BMI 34.54 kg/m   Constitutional:  Well nourished. Alert and oriented, No acute distress. HEENT: [place] AT, mask in place.  Trachea midline Cardiovascular: No clubbing, cyanosis,  or edema. Respiratory: Normal respiratory effort, no increased work of breathing. Neurologic: Grossly intact, no focal deficits, moving all 4 extremities. Psychiatric: Normal mood and affect.   Laboratory Data: Lab Results  Component Value Date   WBC 17.4 (H) 02/21/2020   HGB 12.9 02/21/2020   HCT 39.9 02/21/2020   MCV 95.5 02/21/2020   PLT 235 02/21/2020    Lab Results  Component Value Date   CREATININE 1.10 (H) 02/21/2020    Lab Results  Component Value Date   AST 21 08/26/2015   Lab Results  Component Value Date   ALT 14 08/26/2015    Urinalysis Component     Latest Ref Rng & Units 07/13/2020  Color, Urine     YELLOW YELLOW  Appearance     CLEAR HAZY (A)  Specific Gravity, Urine     1.005 - 1.030 <1.005 (L)  pH     5.0 - 8.0 5.5  Glucose, UA     NEGATIVE mg/dL NEGATIVE  Hgb urine dipstick     NEGATIVE NEGATIVE  Bilirubin Urine     NEGATIVE NEGATIVE  Ketones, ur     NEGATIVE mg/dL NEGATIVE  Protein     NEGATIVE mg/dL NEGATIVE  Nitrite     NEGATIVE NEGATIVE  Leukocytes,Ua     NEGATIVE TRACE (A)  Squamous Epithelial / LPF     0 - 5 NONE SEEN  WBC, UA     0 - 5 WBC/hpf 0-5  RBC / HPF     0 - 5 RBC/hpf NONE SEEN  Bacteria, UA     NONE SEEN NONE SEEN   I have reviewed the labs.   Pertinent Imaging: Results for CLOIS, MONT[place]ON (MRN [id number]) as of 07/13/2020 10:43  Ref. Range 07/13/2020 10:30  Scan Result Unknown 35ml     Assessment & Plan:  1. Urge Incontinence Patient will engage in timed voids, visiting the restroom every two hours while awake Discontinue the Gemtesa as it is not helping with her urinary symptoms explained the PTNS provides treatment by indirectly providing electrical stimulation to the nerves responsible for bladder and pelvic floor function - a needle electrode generates an adjustable electrical pulse that travels to the sacral plexus via the tibial nerve which is located in the ankle, among other functions, the  sacral nerve plexus regulates bladder and pelvic floor function - treatment protocol requires once-a-week treatments for 12 weeks, 30 minutes per session and many patients begin to see improvements by the 6th treatment. Patients who respond to treatment may require occasional treatments (~ once every 3 weeks) to sustain improvements. PTNS is a low-risk procedure. The most common side-effects with PTNS treatment are temporary and minor, resulting from the placement of the needle electrode. They include minor bleeding, mild pain and skin inflammation and patients have seen up to an 80% success rate with this form of treatment RTC for PTNS  2. Vaginal atrophy Continue applying vaginal estrogen cream 3 nights weekly   Return for RTC for PTNS - will need to check with insurance prior to scheduling .  These notes generated with voice recognition software. I apologize for typographical errors.  Zara Council, PA-C  [facility]"{';p=/ [address]  [place], [place] [address] [phone]

## 2020-07-13 ENCOUNTER — Other Ambulatory Visit
Admission: RE | Admit: 2020-07-13 | Discharge: 2020-07-13 | Disposition: A | Discharge status: Home / Self Care | Payer: Medicare Other | Source: Ambulatory Visit | Attending: Urology | Admitting: Urology

## 2020-07-13 ENCOUNTER — Other Ambulatory Visit: Payer: Self-pay

## 2020-07-13 ENCOUNTER — Encounter: Payer: Self-pay | Admitting: Urology

## 2020-07-13 ENCOUNTER — Ambulatory Visit (INDEPENDENT_AMBULATORY_CARE_PROVIDER_SITE_OTHER): Payer: Medicare Other | Admitting: Urology

## 2020-07-13 VITALS — BP 167/101 | HR 106 | Ht 59.0 in

## 2020-07-13 DIAGNOSIS — N952 Postmenopausal atrophic vaginitis: Secondary | ICD-10-CM | POA: Diagnosis not present

## 2020-07-13 DIAGNOSIS — N3941 Urge incontinence: Secondary | ICD-10-CM | POA: Diagnosis not present

## 2020-07-13 LAB — URINALYSIS, COMPLETE (UACMP) WITH MICROSCOPIC
Bacteria, UA: NONE SEEN
Bilirubin Urine: NEGATIVE
Glucose, UA: NEGATIVE mg/dL
Hgb urine dipstick: NEGATIVE
Ketones, ur: NEGATIVE mg/dL
Nitrite: NEGATIVE
Protein, ur: NEGATIVE mg/dL
RBC / HPF: NONE SEEN RBC/hpf (ref 0–5)
Specific Gravity, Urine: <1.005 — ABNORMAL LOW (ref 1.005–1.030)
Squamous Epithelial / HPF: NONE SEEN (ref 0–5)
pH: 5.5 (ref 5.0–8.0)

## 2020-07-13 LAB — BLADDER SCAN AMB NON-IMAGING

## 2020-07-15 ENCOUNTER — Other Ambulatory Visit: Payer: Self-pay | Admitting: Internal Medicine

## 2020-07-15 DIAGNOSIS — M7989 Other specified soft tissue disorders: Secondary | ICD-10-CM

## 2020-07-15 DIAGNOSIS — M79605 Pain in left leg: Secondary | ICD-10-CM

## 2020-07-16 ENCOUNTER — Ambulatory Visit: Payer: Medicare Other

## 2020-07-17 ENCOUNTER — Ambulatory Visit
Admission: RE | Admit: 2020-07-17 | Discharge: 2020-07-17 | Disposition: A | Discharge status: Home / Self Care | Payer: Medicare Other | Source: Ambulatory Visit | Attending: Internal Medicine | Admitting: Internal Medicine

## 2020-07-17 ENCOUNTER — Other Ambulatory Visit: Payer: Self-pay

## 2020-07-17 DIAGNOSIS — M7989 Other specified soft tissue disorders: Secondary | ICD-10-CM

## 2020-07-17 DIAGNOSIS — M79605 Pain in left leg: Secondary | ICD-10-CM | POA: Diagnosis present

## 2020-12-20 NOTE — Progress Notes (Signed)
12/21/2020 12:28 PM   Anne Steele 04/23/1941 [id number]  Referring provider: Tracie Harrier, MD [address] [facility] [place],  [place] [address]  Chief Complaint  Patient presents with  . Urinary Tract Infection   Urological history: 1. Dysuria -cysto 03/2019 NED  2. Urge incontinence -failed OAB agents  3. Benign cyst of bladder dome -resected 2019  4. Bilateral renal cysts -last imaged 2019-benign  5. Bilateral renal calculi -last imaged 2018  6. Vaginal atrophy -managed vaginal estrogen cream three nights weekly  HPI: Anne Steele is a 80 y.o. female who presents today for complaints of burning and pain in the bladder.   She has had some pain in her bottom, since she had taken a round of antibiotics for a facial infection and then she took a pill for a yeast infection.  She thinks she may have a yeast infection or an UTI at this time.    She has not had a vaginal discharge.    She is still having irritation in her vaginal area.    Patient denies any modifying or aggravating factors.  Patient denies any gross hematuria, dysuria or suprapubic/flank pain.  Patient denies any fevers, chills, nausea or vomiting.   UA 6-10 WBC's, few bacteria and WBC clumps.     PMH: Past Medical History:  Diagnosis Date  . Arthritis   . Asthma   . Bell palsy 1973  . Bell's palsy   . Benign bladder tumor   . Depression   . Fibromyalgia   . GERD (gastroesophageal reflux disease)   . Head ache   . Hiatal hernia   . History of kidney stones   . Hypercalcemia   . Hypertension   . Kidney stones   . Optic nerve disease   . Shingles   . Vitamin D deficiency     Surgical History: Past Surgical History:  Procedure Laterality Date  . ABDOMINAL HYSTERECTOMY  1982  . APPENDECTOMY    . ARTERY BIOPSY N/A 02/24/2020   Procedure: BIOPSY TEMPORAL ARTERY;  Surgeon: Benjamine Sprague, DO;  Location: [facility] ORS;  Service: General;  Laterality: N/A;   . BLADDER TUMOR EXCISION  08/2018  . BREAST BIOPSY Right 03/04/2016   US biopsy/ INTRADUCTAL PAPILLOMA  . BREAST BIOPSY Left 03/04/2016   Fibroadenoma  . BREAST EXCISIONAL BIOPSY Left 03/05/2019   Inflamed sebaceous cyst of the left breast, excised by dr. Bary Castilla in office BENIGN EPIDERMAL INCLUSION CYST WITH POSSIBLE RUPTURE AND ASSOCIATED INFLAMMATORY RESPONSE  . CARPAL TUNNEL RELEASE Bilateral 2012  . CATARACT EXTRACTION Bilateral 2017  . CHOLECYSTECTOMY  2001  . COLONOSCOPY    . CYST EXCISION     FINGER  . CYSTOSCOPY W/ RETROGRADES Bilateral 08/29/2018   Procedure: CYSTOSCOPY WITH RETROGRADE PYELOGRAM;  Surgeon: Hollice Espy, MD;  Location: [facility] ORS;  Service: Urology;  Laterality: Bilateral;  . CYSTOSCOPY WITH BIOPSY N/A 08/29/2018   Procedure: CYSTOSCOPY WITH Bladder BIOPSY;  Surgeon: Hollice Espy, MD;  Location: [facility] ORS;  Service: Urology;  Laterality: N/A;  . ESOPHAGOGASTRODUODENOSCOPY    . EYE SURGERY    . KNEE SURGERY Left 2012  . REFRACTIVE SURGERY Bilateral   . SIGMOIDOSCOPY      Home Medications:  Allergies as of 12/21/2020      Reactions   Ivp Dye [iodinated Diagnostic Agents] Anaphylaxis   Penicillins Shortness Of Breath   Patient states it "brings her asthma back on"   Duloxetine Other (See Comments)   nightmares  Iodine Itching   Latanoprost    Blood shot eyes   Tetracyclines & Related Swelling   Trusopt [dorzolamide Hcl]    Zocor [simvastatin]    MUSCLE CRAMPS   Zovirax [acyclovir]    Betadine [povidone Iodine] Rash   Macrobid [nitrofurantoin] Rash   Sulfa Antibiotics Rash   Tape Rash      Medication List       Accurate as of December 21, 2020 12:28 PM. If you have any questions, ask your nurse or doctor.        STOP taking these medications   ibuprofen 400 MG tablet Commonly known as: ADVIL Stopped by: Lawrance Wiedemann, PA-C   loratadine 10 MG tablet Commonly known as: CLARITIN Stopped by: Macon Sandiford, PA-C   magnesium oxide 400  (241.3 Mg) MG tablet Commonly known as: MAG-OX Stopped by: Shawntrice Salle, PA-C   omeprazole 20 MG capsule Commonly known as: PRILOSEC Stopped by: Aarian Cleaver, PA-C   oxybutynin 5 MG tablet Commonly known as: DITROPAN Stopped by: Zara Council, PA-C   Premarin vaginal cream Generic drug: conjugated estrogens Stopped by: Hugh Garrow, PA-C   Systane Nighttime Oint Stopped by: Alani Lacivita, PA-C   tiZANidine 4 MG tablet Commonly known as: ZANAFLEX Stopped by: Mathias Bogacki, PA-C   Travoprost (BAK Free) 0.004 % Soln ophthalmic solution Commonly known as: TRAVATAN Stopped by: Mahrosh Donnell, PA-C   vitamin E 180 MG (400 UNITS) capsule Stopped by: Habeeb Puertas, PA-C     TAKE these medications   acetaminophen 500 MG tablet Commonly known as: TYLENOL Take 1,000 mg by mouth 2 (two) times daily.   allopurinol 100 MG tablet Commonly known as: ZYLOPRIM Take 100 mg by mouth daily.   busPIRone 10 MG tablet Commonly known as: BUSPAR Take 10 mg by mouth 2 (two) times daily.   cetirizine 10 MG tablet Commonly known as: ZYRTEC Take 10 mg by mouth daily.   cyanocobalamin 1000 MCG tablet Take 100 mcg by mouth daily.   Eliquis 5 MG Tabs tablet Generic drug: apixaban Take 1 tablet by mouth 2 (two) times daily.   enalapril 20 MG tablet Commonly known as: VASOTEC Take 20 mg by mouth daily.   fluticasone 50 MCG/ACT nasal spray Commonly known as: FLONASE Place 2 sprays into both nostrils daily.   gabapentin 100 MG capsule Commonly known as: NEURONTIN Take by mouth.   Gemtesa 75 MG Tabs Generic drug: Vibegron Take 75 mg by mouth daily.   metoprolol succinate 25 MG 24 hr tablet Commonly known as: TOPROL-XL Take 25 mg by mouth daily.   nortriptyline 10 MG capsule Commonly known as: PAMELOR Take 10 mg by mouth at bedtime.   pantoprazole 40 MG tablet Commonly known as: PROTONIX Take 40 mg by mouth daily.   predniSONE 10 MG tablet Commonly known  as: DELTASONE Take by mouth. What changed: Another medication with the same name was removed. Continue taking this medication, and follow the directions you see here. Changed by: Zara Council, PA-C   topiramate 25 MG tablet Commonly known as: TOPAMAX Take 25 mg by mouth daily.       Allergies:  Allergies  Allergen Reactions  . Ivp Dye [Iodinated Diagnostic Agents] Anaphylaxis  . Penicillins Shortness Of Breath    Patient states it "brings her asthma back on"  . Duloxetine Other (See Comments)    nightmares  . Iodine Itching  . Latanoprost     Blood shot eyes  . Tetracyclines & Related Swelling  . Trusopt [Dorzolamide Hcl]   .  Zocor [Simvastatin]     MUSCLE CRAMPS  . Zovirax [Acyclovir]   . Betadine [Povidone Iodine] Rash  . Macrobid [Nitrofurantoin] Rash  . Sulfa Antibiotics Rash  . Tape Rash    Family History: Family History  Problem Relation Age of Onset  . Leukemia Mother   . Diabetes Father   . Breast cancer Sister   . Brain cancer Brother   . Diabetes Sister   . Diabetes Brother   . Prostate cancer Neg Hx   . Kidney cancer Neg Hx   . Bladder Cancer Neg Hx     Social History:  reports that she has never smoked. She has never used smokeless tobacco. She reports that she does not drink alcohol and does not use drugs.  ROS: Pertinent ROS in HPI  Physical Exam: BP (!) 155/75   Pulse 80   Ht '4\' 11"'  (1.499 m)   Wt 166 lb (75.3 kg)   BMI 33.53 kg/m   Constitutional:  Well nourished. Alert and oriented, No acute distress. HEENT: [place] AT, mask in place.  Trachea midline Cardiovascular: No clubbing, cyanosis, or edema. Respiratory: Normal respiratory effort, no increased work of breathing. GU: No CVA tenderness.  No bladder fullness or masses.  Atrophic  external genitalia, sparse pubic hair distribution, no lesions.  Normal urethral meatus, no lesions, no prolapse, no discharge.   No urethral masses, tenderness and/or tenderness. No bladder fullness,  tenderness or masses. pale vagina mucosa, fair estrogen effect, no discharge, no lesions, fair pelvic support, grade I cystocele and no rectocele noted.  Anus and perineum are without rashes or lesions.    Neurologic: Grossly intact, no focal deficits, moving all 4 extremities. Psychiatric: Normal mood and affect.   Laboratory Data: Specimen:  Blood  Ref Range & Units 2 mo ago  WBC (White Blood Cell Count) 4.1 - 10.2 10^3/uL 9.6   RBC (Red Blood Cell Count) 4.04 - 5.48 10^6/uL 4.09   Hemoglobin 12.0 - 15.0 gm/dL 12.9   Hematocrit 35.0 - 47.0 % 39.4   MCV (Mean Corpuscular Volume) 80.0 - 100.0 fl 96.3   MCH (Mean Corpuscular Hemoglobin) 27.0 - 31.2 pg 31.5High   MCHC (Mean Corpuscular Hemoglobin Concentration) 32.0 - 36.0 gm/dL 32.7   Platelet Count 150 - 450 10^3/uL 306   RDW-CV (Red Cell Distribution Width) 11.6 - 14.8 % 13.1   MPV (Mean Platelet Volume) 9.4 - 12.4 fl 10.4   Neutrophils 1.50 - 7.80 10^3/uL 4.55   Lymphocytes 1.00 - 3.60 10^3/uL 3.71High   Monocytes 0.00 - 1.50 10^3/uL 0.88   Eosinophils 0.00 - 0.55 10^3/uL 0.38   Basophils 0.00 - 0.09 10^3/uL 0.06   Neutrophil % 32.0 - 70.0 % 47.4   Lymphocyte % 10.0 - 50.0 % 38.7   Monocyte % 4.0 - 13.0 % 9.2   Eosinophil % 1.0 - 5.0 % 4.0   Basophil% 0.0 - 2.0 % 0.6   Immature Granulocyte % <=0.7 % 0.1   Immature Granulocyte Count <=0.06 10^3/L 0.01   Resulting [facility] - [facility]  Specimen Collected: 10/13/20 9:10 AM Last Resulted: 10/13/20 10:10 AM  Received From: [facility]  Result Received: 12/20/20 6:56 PM   Specimen:  Blood  Ref Range & Units 2 mo ago  Glucose 70 - 110 mg/dL 104   Sodium 136 - 145 mmol/L 139   Potassium 3.6 - 5.1 mmol/L 4.0   Chloride 97 - 109 mmol/L 102   Carbon Dioxide (CO2) 22.0 - 32.0  mmol/L 28.9   Urea Nitrogen (BUN) 7 - 25 mg/dL 17   Creatinine 0.6 - 1.1 mg/dL 1.2High   Glomerular Filtration Rate (eGFR), MDRD Estimate >60 mL/min/1.73sq m 43Low    Calcium 8.7 - 10.3 mg/dL 11.4High   AST  8 - 39 U/L 23   ALT  5 - 38 U/L 15   Alk Phos (alkaline Phosphatase) 34 - 104 U/L 47   Albumin 3.5 - 4.8 g/dL 4.4   Bilirubin, Total 0.3 - 1.2 mg/dL 0.4   Protein, Total 6.1 - 7.9 g/dL 7.2   A/G Ratio 1.0 - 5.0 gm/dL 1.6   Resulting Agency  [facility] - [facility]  Specimen Collected: 10/13/20 9:10 AM Last Resulted: 10/13/20 10:43 AM  Received From: [facility]  Result Received: 12/20/20 6:56 PM   Specimen:  Blood  Ref Range & Units 2 mo ago  Hemoglobin A1C 4.2 - 5.6 % 5.7High   Average Blood Glucose (Calc) mg/dL 117   Resulting Agency  [facility] - [facility]   Narrative Performed by [facility] - [facility] Normal Range:  4.2 - 5.6%  Increased Risk: 5.7 - 6.4%  Diabetes:    >= 6.5%  Glycemic Control for adults with diabetes: <7%  Specimen Collected: 10/13/20 9:10 AM Last Resulted: 10/13/20 1:12 PM  Received From: [facility]  Result Received: 12/20/20 6:56 PM   Specimen:  Blood  Ref Range & Units 2 mo ago  Cholesterol, Total 100 - 200 mg/dL 238High   Triglyceride 35 - 199 mg/dL 381High   HDL (High Density Lipoprotein) Cholesterol 35.0 - 85.0 mg/dL 38.0   LDL Calculated 0 - 130 mg/dL 124   VLDL Cholesterol mg/dL 76   Cholesterol/HDL Ratio  6.3   Resulting Agency  [facility] - LAB  Specimen Collected: 10/13/20 9:10 AM Last Resulted: 10/13/20 10:43 AM  Received From: [facility]  Result Received: 12/20/20 6:56 PM   Specimen:  Urine  Ref Range & Units 2 mo ago  Color Yellow, Violet, Light Violet, Dark Violet Yellow   Clarity Clear Clear   Specific Gravity 1.000 - 1.030 1.020   pH, Urine 5.0 - 8.0 5.5   Protein, Urinalysis Negative, Trace mg/dL Negative   Glucose, Urinalysis Negative mg/dL Negative   Ketones, Urinalysis Negative mg/dL Negative   Blood, Urinalysis Negative Negative   Nitrite, Urinalysis Negative Negative   Leukocyte  Esterase, Urinalysis Negative SmallAbnormal   White Blood Cells, Urinalysis None Seen, 0-3 /hpf 4-10Abnormal   Red Blood Cells, Urinalysis None Seen, 0-3 /hpf 0-3   Bacteria, Urinalysis None Seen /hpf FewAbnormal   Squamous Epithelial Cells, Urinalysis Rare, Few, None Seen /hpf Few   Resulting Agency  [facility] - [facility]  Specimen Collected: 10/13/20 9:10 AM Last Resulted: 10/13/20 2:37 PM  Received From: [facility]  Result Received: 12/20/20 6:56 PM   Urinalysis Component     Latest Ref Rng & Units 12/21/2020  Color, Urine     YELLOW YELLOW  Appearance     CLEAR CLEAR  Specific Gravity, Urine     1.005 - 1.030 <1.005 (L)  pH     5.0 - 8.0 5.5  Glucose, UA     NEGATIVE mg/dL NEGATIVE  Hgb urine dipstick     NEGATIVE NEGATIVE  Bilirubin Urine     NEGATIVE NEGATIVE  Ketones, ur     NEGATIVE mg/dL NEGATIVE  Protein     NEGATIVE mg/dL NEGATIVE  Nitrite  NEGATIVE NEGATIVE  Leukocytes,Ua     NEGATIVE TRACE (A)  Squamous Epithelial / LPF     0 - 5 0-5  WBC, UA     0 - 5 WBC/hpf 6-10  RBC / HPF     0 - 5 RBC/hpf NONE SEEN  Bacteria, UA     NONE SEEN FEW (A)  WBC Clumps      PRESENT  I have reviewed the labs.   Pertinent Imaging: No recent imaging  Assessment & Plan:    1. Bladder pain -UA -urine culture -will hold on prescribing an antibiotic at this time   2. Urge incontinence -managed with Gemtesa 75 mg daily  3. Vaginal atrophy -Continue applying vaginal estrogen cream 3 nights weekly -use vaginal lubricants for discomfort    Return for pending urine cutlure results .  These notes generated with voice recognition software. I apologize for typographical errors.  Zara Council, PA-C  [facility]"{';p=/ [address]  [place], [place] [address] [phone]

## 2020-12-21 ENCOUNTER — Other Ambulatory Visit
Admission: RE | Admit: 2020-12-21 | Discharge: 2020-12-21 | Disposition: A | Discharge status: Home / Self Care | Payer: Medicare Other | Attending: Urology | Admitting: Urology

## 2020-12-21 ENCOUNTER — Other Ambulatory Visit: Payer: Self-pay | Admitting: *Deleted

## 2020-12-21 ENCOUNTER — Ambulatory Visit: Payer: Medicare Other | Admitting: Urology

## 2020-12-21 ENCOUNTER — Encounter: Payer: Self-pay | Admitting: Urology

## 2020-12-21 ENCOUNTER — Other Ambulatory Visit: Payer: Self-pay

## 2020-12-21 VITALS — BP 155/75 | HR 80 | Ht 59.0 in | Wt 166.0 lb

## 2020-12-21 DIAGNOSIS — R3989 Other symptoms and signs involving the genitourinary system: Secondary | ICD-10-CM

## 2020-12-21 DIAGNOSIS — N952 Postmenopausal atrophic vaginitis: Secondary | ICD-10-CM

## 2020-12-21 DIAGNOSIS — N3941 Urge incontinence: Secondary | ICD-10-CM

## 2020-12-21 DIAGNOSIS — R300 Dysuria: Secondary | ICD-10-CM

## 2020-12-21 LAB — URINALYSIS, COMPLETE (UACMP) WITH MICROSCOPIC
Bilirubin Urine: NEGATIVE
Glucose, UA: NEGATIVE mg/dL
Hgb urine dipstick: NEGATIVE
Ketones, ur: NEGATIVE mg/dL
Nitrite: NEGATIVE
Protein, ur: NEGATIVE mg/dL
RBC / HPF: NONE SEEN RBC/hpf (ref 0–5)
Specific Gravity, Urine: <1.005 — ABNORMAL LOW (ref 1.005–1.030)
pH: 5.5 (ref 5.0–8.0)

## 2020-12-22 LAB — URINE CULTURE: Culture: NO GROWTH

## 2021-02-09 ENCOUNTER — Telehealth: Payer: Self-pay | Admitting: Urology

## 2021-02-09 NOTE — Progress Notes (Deleted)
02/10/2021 1:10 PM   Anne Steele [birth year] [id number]  Referring provider: Tracie Harrier, [place] [facility] [place],  [place] [address]  No chief complaint on file.  Urological history: 1. Dysuria -cysto 03/2019 NED  2. Urge incontinence -failed OAB agents  3. Benign cyst of bladder dome -resected 2019  4. Bilateral renal cysts -last imaged 2019-benign  5. Bilateral renal calculi -last imaged 2018  6. Vaginal atrophy -managed vaginal estrogen cream three nights weekly  HPI: Anne Steele is a 80 y.o. female who presents today for complaints of burning and pain in the bladder.     PMH: Past Medical History:  Diagnosis Date  . Arthritis   . Asthma   . Bell palsy 1973  . Bell's palsy   . Benign bladder tumor   . Depression   . Fibromyalgia   . GERD (gastroesophageal reflux disease)   . Head ache   . Hiatal hernia   . History of kidney stones   . Hypercalcemia   . Hypertension   . Kidney stones   . Optic nerve disease   . Shingles   . Vitamin D deficiency     Surgical History: Past Surgical History:  Procedure Laterality Date  . ABDOMINAL HYSTERECTOMY  1982  . APPENDECTOMY    . ARTERY BIOPSY N/A 02/24/2020   Procedure: BIOPSY TEMPORAL ARTERY;  Surgeon: Benjamine Sprague, DO;  Location: [facility] ORS;  Service: General;  Laterality: N/A;  . BLADDER TUMOR EXCISION  08/2018  . BREAST BIOPSY Right 03/04/2016   US biopsy/ INTRADUCTAL PAPILLOMA  . BREAST BIOPSY Left 03/04/2016   Fibroadenoma  . BREAST EXCISIONAL BIOPSY Left 03/05/2019   Inflamed sebaceous cyst of the left breast, excised by dr. Bary Castilla in office BENIGN EPIDERMAL INCLUSION CYST WITH POSSIBLE RUPTURE AND ASSOCIATED INFLAMMATORY RESPONSE  . CARPAL TUNNEL RELEASE Bilateral 2012  . CATARACT EXTRACTION Bilateral 2017  . CHOLECYSTECTOMY  2001  . COLONOSCOPY    . CYST EXCISION     FINGER  . CYSTOSCOPY W/ RETROGRADES Bilateral 08/29/2018   Procedure:  CYSTOSCOPY WITH RETROGRADE PYELOGRAM;  Surgeon: Hollice Espy, MD;  Location: [facility] ORS;  Service: Urology;  Laterality: Bilateral;  . CYSTOSCOPY WITH BIOPSY N/A 08/29/2018   Procedure: CYSTOSCOPY WITH Bladder BIOPSY;  Surgeon: Hollice Espy, MD;  Location: [facility] ORS;  Service: Urology;  Laterality: N/A;  . ESOPHAGOGASTRODUODENOSCOPY    . EYE SURGERY    . KNEE SURGERY Left 2012  . REFRACTIVE SURGERY Bilateral   . SIGMOIDOSCOPY      Home Medications:  Allergies as of 02/10/2021      Reactions   Ivp Dye [iodinated Diagnostic Agents] Anaphylaxis   Penicillins Shortness Of Breath   Patient states it "brings her asthma back on"   Duloxetine Other (See Comments)   nightmares   Iodine Itching   Latanoprost    Blood shot eyes   Tetracyclines & Related Swelling   Trusopt [dorzolamide Hcl]    Zocor [simvastatin]    MUSCLE CRAMPS   Zovirax [acyclovir]    Betadine [povidone Iodine] Rash   Macrobid [nitrofurantoin] Rash   Sulfa Antibiotics Rash   Tape Rash      Medication List       Accurate as of Feb 09, 2021  1:10 PM. If you have any questions, ask your nurse or doctor.        acetaminophen 500 MG tablet Commonly known as: TYLENOL Take 1,000 mg by mouth 2 (two) times daily.  allopurinol 100 MG tablet Commonly known as: ZYLOPRIM Take 100 mg by mouth daily.   busPIRone 10 MG tablet Commonly known as: BUSPAR Take 10 mg by mouth 2 (two) times daily.   cetirizine 10 MG tablet Commonly known as: ZYRTEC Take 10 mg by mouth daily.   cyanocobalamin 1000 MCG tablet Take 100 mcg by mouth daily.   Eliquis 5 MG Tabs tablet Generic drug: apixaban Take 1 tablet by mouth 2 (two) times daily.   enalapril 20 MG tablet Commonly known as: VASOTEC Take 20 mg by mouth daily.   fluticasone 50 MCG/ACT nasal spray Commonly known as: FLONASE Place 2 sprays into both nostrils daily.   gabapentin 100 MG capsule Commonly known as: NEURONTIN Take by mouth.   Gemtesa 75 MG  Tabs Generic drug: Vibegron Take 75 mg by mouth daily.   metoprolol succinate 25 MG 24 hr tablet Commonly known as: TOPROL-XL Take 25 mg by mouth daily.   nortriptyline 10 MG capsule Commonly known as: PAMELOR Take 10 mg by mouth at bedtime.   pantoprazole 40 MG tablet Commonly known as: PROTONIX Take 40 mg by mouth daily.   topiramate 25 MG tablet Commonly known as: TOPAMAX Take 25 mg by mouth daily.       Allergies:  Allergies  Allergen Reactions  . Ivp Dye [Iodinated Diagnostic Agents] Anaphylaxis  . Penicillins Shortness Of Breath    Patient states it "brings her asthma back on"  . Duloxetine Other (See Comments)    nightmares  . Iodine Itching  . Latanoprost     Blood shot eyes  . Tetracyclines & Related Swelling  . Trusopt [Dorzolamide Hcl]   . Zocor [Simvastatin]     MUSCLE CRAMPS  . Zovirax [Acyclovir]   . Betadine [Povidone Iodine] Rash  . Macrobid [Nitrofurantoin] Rash  . Sulfa Antibiotics Rash  . Tape Rash    Family History: Family History  Problem Relation Age of Onset  . Leukemia Mother   . Diabetes Father   . Breast cancer Sister   . Brain cancer Brother   . Diabetes Sister   . Diabetes Brother   . Prostate cancer Neg Hx   . Kidney cancer Neg Hx   . Bladder Cancer Neg Hx     Social History:  reports that she has never smoked. She has never used smokeless tobacco. She reports that she does not drink alcohol and does not use drugs.  ROS: Pertinent ROS in HPI  Physical Exam: There were no vitals taken for this visit.  Constitutional:  Well nourished. Alert and oriented, No acute distress. HEENT: [place] AT, moist mucus membranes.  Trachea midline, no masses. Cardiovascular: No clubbing, cyanosis, or edema. Respiratory: Normal respiratory effort, no increased work of breathing. GI: Abdomen is soft, non tender, non distended, no abdominal masses. Liver and spleen not palpable.  No hernias appreciated.  Stool sample for occult testing is not  indicated.   GU: No CVA tenderness.  No bladder fullness or masses.  *** external genitalia, *** pubic hair distribution, no lesions.  Normal urethral meatus, no lesions, no prolapse, no discharge.   No urethral masses, tenderness and/or tenderness. No bladder fullness, tenderness or masses. *** vagina mucosa, *** estrogen effect, no discharge, no lesions, *** pelvic support, *** cystocele and *** rectocele noted.  No cervical motion tenderness.  Uterus is freely mobile and non-fixed.  No adnexal/parametria masses or tenderness noted.  Anus and perineum are without rashes or lesions.   ***  Skin: No rashes,  bruises or suspicious lesions. Lymph: No cervical or inguinal adenopathy. Neurologic: Grossly intact, no focal deficits, moving all 4 extremities. Psychiatric: Normal mood and affect.   Laboratory Data: No new labs since last visit   Pertinent Imaging: No recent imaging  Assessment & Plan:    1. Bladder pain -UA -urine culture -will hold on prescribing an antibiotic at this time   2. Urge incontinence -managed with Gemtesa 75 mg daily  3. Vaginal atrophy -Continue applying vaginal estrogen cream 3 nights weekly -use vaginal lubricants for discomfort    No follow-ups on file.  These notes generated with voice recognition software. I apologize for typographical errors.  Zara Council, PA-C  [facility]"{';p=/ [address]  [place], [place] [address] [phone]

## 2021-02-09 NOTE — Telephone Encounter (Signed)
Spoke with patient and rescheduled for Thursday at 1:00pm

## 2021-02-09 NOTE — Telephone Encounter (Signed)
Pt called to let us know she can't make a 9:30 appt tomorrow, but she can drop off a urine at 10:00.  She's in bad shape and needs to been seen.  I told pt I would leave 10:00 appt w/Shannon for tomorrow, until we know for sure she can't come.  Pt also wants to know why she was referred to a skin dr.

## 2021-02-09 NOTE — Telephone Encounter (Signed)
Anne Steele is on my schedule for 930.  If she comes at 10:00 I will not be able to see her.  She cannot just drop a urine at 10 AM.  Is she able to come tomorrow afternoon?

## 2021-02-10 ENCOUNTER — Ambulatory Visit: Payer: Medicare Other | Admitting: Urology

## 2021-02-10 NOTE — Progress Notes (Deleted)
02/11/2021 2:21 PM   Anne Steele [birth year] [id number]  Referring provider: Tracie Harrier, [place] [facility] [place],  [place] [id number]  No chief complaint on file.  Urological history: 1. Dysuria -cysto 03/2019 NED  2. Urge incontinence -failed OAB agents  3. Benign cyst of bladder dome -resected 2019  4. Bilateral renal cysts -last imaged 2019-benign  5. Bilateral renal calculi -last imaged 2018  6. Vaginal atrophy -managed vaginal estrogen cream three nights weekly  HPI: Anne Steele is a 80 y.o. female who presents today for complaints of burning and pain in the bladder.     PMH: Past Medical History:  Diagnosis Date  . Arthritis   . Asthma   . Bell palsy 1973  . Bell's palsy   . Benign bladder tumor   . Depression   . Fibromyalgia   . GERD (gastroesophageal reflux disease)   . Head ache   . Hiatal hernia   . History of kidney stones   . Hypercalcemia   . Hypertension   . Kidney stones   . Optic nerve disease   . Shingles   . Vitamin D deficiency     Surgical History: Past Surgical History:  Procedure Laterality Date  . ABDOMINAL HYSTERECTOMY  1982  . APPENDECTOMY    . ARTERY BIOPSY N/A 02/24/2020   Procedure: BIOPSY TEMPORAL ARTERY;  Surgeon: Benjamine Sprague, DO;  Location: [facility] ORS;  Service: General;  Laterality: N/A;  . BLADDER TUMOR EXCISION  08/2018  . BREAST BIOPSY Right 03/04/2016   US biopsy/ INTRADUCTAL PAPILLOMA  . BREAST BIOPSY Left 03/04/2016   Fibroadenoma  . BREAST EXCISIONAL BIOPSY Left 03/05/2019   Inflamed sebaceous cyst of the left breast, excised by dr. Bary Castilla in office BENIGN EPIDERMAL INCLUSION CYST WITH POSSIBLE RUPTURE AND ASSOCIATED INFLAMMATORY RESPONSE  . CARPAL TUNNEL RELEASE Bilateral 2012  . CATARACT EXTRACTION Bilateral 2017  . CHOLECYSTECTOMY  2001  . COLONOSCOPY    . CYST EXCISION     FINGER  . CYSTOSCOPY W/ RETROGRADES Bilateral 08/29/2018   Procedure:  CYSTOSCOPY WITH RETROGRADE PYELOGRAM;  Surgeon: Hollice Espy, MD;  Location: [facility] ORS;  Service: Urology;  Laterality: Bilateral;  . CYSTOSCOPY WITH BIOPSY N/A 08/29/2018   Procedure: CYSTOSCOPY WITH Bladder BIOPSY;  Surgeon: Hollice Espy, MD;  Location: [facility] ORS;  Service: Urology;  Laterality: N/A;  . ESOPHAGOGASTRODUODENOSCOPY    . EYE SURGERY    . KNEE SURGERY Left 2012  . REFRACTIVE SURGERY Bilateral   . SIGMOIDOSCOPY      Home Medications:  Allergies as of 02/11/2021      Reactions   Ivp Dye [iodinated Diagnostic Agents] Anaphylaxis   Penicillins Shortness Of Breath   Patient states it "brings her asthma back on"   Duloxetine Other (See Comments)   nightmares   Iodine Itching   Latanoprost    Blood shot eyes   Tetracyclines & Related Swelling   Trusopt [dorzolamide Hcl]    Zocor [simvastatin]    MUSCLE CRAMPS   Zovirax [acyclovir]    Betadine [povidone Iodine] Rash   Macrobid [nitrofurantoin] Rash   Sulfa Antibiotics Rash   Tape Rash      Medication List       Accurate as of Feb 10, 2021  2:21 PM. If you have any questions, ask your nurse or doctor.        acetaminophen 500 MG tablet Commonly known as: TYLENOL Take 1,000 mg by mouth 2 (two) times daily.  allopurinol 100 MG tablet Commonly known as: ZYLOPRIM Take 100 mg by mouth daily.   busPIRone 10 MG tablet Commonly known as: BUSPAR Take 10 mg by mouth 2 (two) times daily.   cetirizine 10 MG tablet Commonly known as: ZYRTEC Take 10 mg by mouth daily.   cyanocobalamin 1000 MCG tablet Take 100 mcg by mouth daily.   Eliquis 5 MG Tabs tablet Generic drug: apixaban Take 1 tablet by mouth 2 (two) times daily.   enalapril 20 MG tablet Commonly known as: VASOTEC Take 20 mg by mouth daily.   fluticasone 50 MCG/ACT nasal spray Commonly known as: FLONASE Place 2 sprays into both nostrils daily.   gabapentin 100 MG capsule Commonly known as: NEURONTIN Take by mouth.   Gemtesa 75 MG  Tabs Generic drug: Vibegron Take 75 mg by mouth daily.   metoprolol succinate 25 MG 24 hr tablet Commonly known as: TOPROL-XL Take 25 mg by mouth daily.   nortriptyline 10 MG capsule Commonly known as: PAMELOR Take 10 mg by mouth at bedtime.   pantoprazole 40 MG tablet Commonly known as: PROTONIX Take 40 mg by mouth daily.   topiramate 25 MG tablet Commonly known as: TOPAMAX Take 25 mg by mouth daily.       Allergies:  Allergies  Allergen Reactions  . Ivp Dye [Iodinated Diagnostic Agents] Anaphylaxis  . Penicillins Shortness Of Breath    Patient states it "brings her asthma back on"  . Duloxetine Other (See Comments)    nightmares  . Iodine Itching  . Latanoprost     Blood shot eyes  . Tetracyclines & Related Swelling  . Trusopt [Dorzolamide Hcl]   . Zocor [Simvastatin]     MUSCLE CRAMPS  . Zovirax [Acyclovir]   . Betadine [Povidone Iodine] Rash  . Macrobid [Nitrofurantoin] Rash  . Sulfa Antibiotics Rash  . Tape Rash    Family History: Family History  Problem Relation Age of Onset  . Leukemia Mother   . Diabetes Father   . Breast cancer Sister   . Brain cancer Brother   . Diabetes Sister   . Diabetes Brother   . Prostate cancer Neg Hx   . Kidney cancer Neg Hx   . Bladder Cancer Neg Hx     Social History:  reports that she has never smoked. She has never used smokeless tobacco. She reports that she does not drink alcohol and does not use drugs.  ROS: Pertinent ROS in HPI  Physical Exam: There were no vitals taken for this visit.  Constitutional:  Well nourished. Alert and oriented, No acute distress. HEENT: [place] AT, moist mucus membranes.  Trachea midline, no masses. Cardiovascular: No clubbing, cyanosis, or edema. Respiratory: Normal respiratory effort, no increased work of breathing. GI: Abdomen is soft, non tender, non distended, no abdominal masses. Liver and spleen not palpable.  No hernias appreciated.  Stool sample for occult testing is not  indicated.   GU: No CVA tenderness.  No bladder fullness or masses.  *** external genitalia, *** pubic hair distribution, no lesions.  Normal urethral meatus, no lesions, no prolapse, no discharge.   No urethral masses, tenderness and/or tenderness. No bladder fullness, tenderness or masses. *** vagina mucosa, *** estrogen effect, no discharge, no lesions, *** pelvic support, *** cystocele and *** rectocele noted.  No cervical motion tenderness.  Uterus is freely mobile and non-fixed.  No adnexal/parametria masses or tenderness noted.  Anus and perineum are without rashes or lesions.   ***  Skin: No rashes,  bruises or suspicious lesions. Lymph: No cervical or inguinal adenopathy. Neurologic: Grossly intact, no focal deficits, moving all 4 extremities. Psychiatric: Normal mood and affect.   Laboratory Data: No new labs since last visit   Pertinent Imaging: No recent imaging  Assessment & Plan:    1. Bladder pain -UA -urine culture -will hold on prescribing an antibiotic at this time   2. Urge incontinence -managed with Gemtesa 75 mg daily  3. Vaginal atrophy -Continue applying vaginal estrogen cream 3 nights weekly -use vaginal lubricants for discomfort    No follow-ups on file.  These notes generated with voice recognition software. I apologize for typographical errors.  Zara Council, PA-C  [facility]"{';p=/ [address]  [place], [place] [address] [phone]

## 2021-02-11 ENCOUNTER — Ambulatory Visit: Payer: Medicare Other | Admitting: Urology

## 2021-02-11 DIAGNOSIS — R300 Dysuria: Secondary | ICD-10-CM

## 2021-02-12 ENCOUNTER — Encounter: Payer: Self-pay | Admitting: Urology

## 2021-02-26 ENCOUNTER — Other Ambulatory Visit: Payer: Self-pay | Admitting: Urology

## 2021-03-19 ENCOUNTER — Other Ambulatory Visit: Payer: Self-pay | Admitting: Internal Medicine

## 2021-03-19 ENCOUNTER — Other Ambulatory Visit: Payer: Self-pay

## 2021-03-19 ENCOUNTER — Ambulatory Visit
Admission: RE | Admit: 2021-03-19 | Discharge: 2021-03-19 | Disposition: A | Discharge status: Home / Self Care | Payer: Medicare Other | Source: Ambulatory Visit | Attending: Internal Medicine | Admitting: Internal Medicine

## 2021-03-19 DIAGNOSIS — R600 Localized edema: Secondary | ICD-10-CM | POA: Insufficient documentation

## 2021-03-25 ENCOUNTER — Other Ambulatory Visit: Payer: Self-pay | Admitting: Urology

## 2021-04-12 ENCOUNTER — Ambulatory Visit: Payer: Medicare Other

## 2021-05-04 ENCOUNTER — Telehealth: Payer: Self-pay | Admitting: Urology

## 2021-05-04 NOTE — Telephone Encounter (Signed)
Pt called office and wanted to be seen for a bladder infection.  Pt's bladder is irritated, it feels like it's pulling out and she can't hold her water.  She is using a cream and medicine for leakage.  She can't hold it, it just comes out.

## 2021-05-04 NOTE — Telephone Encounter (Signed)
Yes she can just schedule an appointment

## 2021-05-04 NOTE — Telephone Encounter (Signed)
Pt offered same day appt in [place], pt declined as she prefers to see a female provider. Pt scheduled for 05/07/2021.

## 2021-05-07 ENCOUNTER — Ambulatory Visit: Payer: Medicare Other | Admitting: Urology

## 2021-05-19 NOTE — Progress Notes (Incomplete)
05/19/21 1:08 PM   Anne Steele Apr 26, 1941 [id number]  Referring provider:  Tracie Harrier, [place] [facility] [place],  [place] [address] No chief complaint on file.  Urological history:  1. Dysuria  -cysto 03/2019 NED   2. Urge incontinence  -failed OAB agents     3. Benign cyst of bladder dome  -resected 2019     4. Bilateral renal cysts  -last imaged 2019-benign   5. Bilateral renal calculi  -last imaged 2018     6. Vaginal atrophy  -managed vaginal estrogen cream three nights   HPI: Anne Steele is a 79 y.o.female who presents today for further evaluation of possible UTI.   She has a recent micrscopic urinalysis on 05/07/2021 that showed rare bacteria but was otherwise unremarkable.     PMH: Past Medical History:  Diagnosis Date   Arthritis    Asthma    Bell palsy 1973   Bell's palsy    Benign bladder tumor    Depression    Fibromyalgia    GERD (gastroesophageal reflux disease)    Head ache    Hiatal hernia    History of kidney stones    Hypercalcemia    Hypertension    Kidney stones    Optic nerve disease    Shingles    Vitamin D deficiency     Surgical History: Past Surgical History:  Procedure Laterality Date   ABDOMINAL HYSTERECTOMY  1982   APPENDECTOMY     ARTERY BIOPSY N/A 02/24/2020   Procedure: BIOPSY TEMPORAL ARTERY;  Surgeon: Benjamine Sprague, DO;  Location: [facility] ORS;  Service: General;  Laterality: N/A;   BLADDER TUMOR EXCISION  08/2018   BREAST BIOPSY Right 03/04/2016   US biopsy/ INTRADUCTAL PAPILLOMA   BREAST BIOPSY Left 03/04/2016   Fibroadenoma   BREAST EXCISIONAL BIOPSY Left 03/05/2019   Inflamed sebaceous cyst of the left breast, excised by dr. Bary Castilla in office BENIGN EPIDERMAL INCLUSION CYST WITH POSSIBLE RUPTURE AND ASSOCIATED INFLAMMATORY RESPONSE   CARPAL TUNNEL RELEASE Bilateral 2012   CATARACT EXTRACTION Bilateral 2017   CHOLECYSTECTOMY  2001   COLONOSCOPY     CYST EXCISION      FINGER   CYSTOSCOPY W/ RETROGRADES Bilateral 08/29/2018   Procedure: CYSTOSCOPY WITH RETROGRADE PYELOGRAM;  Surgeon: Hollice Espy, MD;  Location: [facility] ORS;  Service: Urology;  Laterality: Bilateral;   CYSTOSCOPY WITH BIOPSY N/A 08/29/2018   Procedure: CYSTOSCOPY WITH Bladder BIOPSY;  Surgeon: Hollice Espy, MD;  Location: [facility] ORS;  Service: Urology;  Laterality: N/A;   ESOPHAGOGASTRODUODENOSCOPY     EYE SURGERY     KNEE SURGERY Left 2012   REFRACTIVE SURGERY Bilateral    SIGMOIDOSCOPY      Home Medications:  Allergies as of 05/20/2021       Reactions   Ivp Dye [iodinated Diagnostic Agents] Anaphylaxis   Penicillins Shortness Of Breath   Patient states it "brings her asthma back on"   Duloxetine Other (See Comments)   nightmares   Iodine Itching   Latanoprost    Blood shot eyes   Tetracyclines & Related Swelling   Trusopt [dorzolamide Hcl]    Zocor [simvastatin]    MUSCLE CRAMPS   Zovirax [acyclovir]    Betadine [povidone Iodine] Rash   Macrobid [nitrofurantoin] Rash   Sulfa Antibiotics Rash   Tape Rash        Medication List        Accurate as of May 19, 2021  1:08 PM. If  you have any questions, ask your nurse or doctor.          acetaminophen 500 MG tablet Commonly known as: TYLENOL Take 1,000 mg by mouth 2 (two) times daily.   allopurinol 100 MG tablet Commonly known as: ZYLOPRIM Take 100 mg by mouth daily.   busPIRone 10 MG tablet Commonly known as: BUSPAR Take 10 mg by mouth 2 (two) times daily.   cetirizine 10 MG tablet Commonly known as: ZYRTEC Take 10 mg by mouth daily.   cyanocobalamin 1000 MCG tablet Take 100 mcg by mouth daily.   Eliquis 5 MG Tabs tablet Generic drug: apixaban Take 1 tablet by mouth 2 (two) times daily.   enalapril 20 MG tablet Commonly known as: VASOTEC Take 20 mg by mouth daily.   fluticasone 50 MCG/ACT nasal spray Commonly known as: FLONASE Place 2 sprays into both nostrils daily.   gabapentin 100 MG  capsule Commonly known as: NEURONTIN Take by mouth.   Gemtesa 75 MG Tabs Generic drug: Vibegron TAKE 1 TABLET BY MOUTH ONCE DAILY.   metoprolol succinate 25 MG 24 hr tablet Commonly known as: TOPROL-XL Take 25 mg by mouth daily.   nortriptyline 10 MG capsule Commonly known as: PAMELOR Take 10 mg by mouth at bedtime.   pantoprazole 40 MG tablet Commonly known as: PROTONIX Take 40 mg by mouth daily.   topiramate 25 MG tablet Commonly known as: TOPAMAX Take 25 mg by mouth daily.        Allergies:  Allergies  Allergen Reactions   Ivp Dye [Iodinated Diagnostic Agents] Anaphylaxis   Penicillins Shortness Of Breath    Patient states it "brings her asthma back on"   Duloxetine Other (See Comments)    nightmares   Iodine Itching   Latanoprost     Blood shot eyes   Tetracyclines & Related Swelling   Trusopt [Dorzolamide Hcl]    Zocor [Simvastatin]     MUSCLE CRAMPS   Zovirax [Acyclovir]    Betadine [Povidone Iodine] Rash   Macrobid [Nitrofurantoin] Rash   Sulfa Antibiotics Rash   Tape Rash    Family History: Family History  Problem Relation Age of Onset   Leukemia Mother    Diabetes Father    Breast cancer Sister    Brain cancer Brother    Diabetes Sister    Diabetes Brother    Prostate cancer Neg Hx    Kidney cancer Neg Hx    Bladder Cancer Neg Hx     Social History:  reports that she has never smoked. She has never used smokeless tobacco. She reports that she does not drink alcohol and does not use drugs.   Physical Exam: There were no vitals taken for this visit.  Constitutional:  Alert and oriented, No acute distress. HEENT: [place] AT, moist mucus membranes.  Trachea midline, no masses. Cardiovascular: No clubbing, cyanosis, or edema. Respiratory: Normal respiratory effort, no increased work of breathing. GU: No CVA tenderness Skin: No rashes, bruises or suspicious lesions. Neurologic: Grossly intact, no focal deficits, moving all 4  extremities. Psychiatric: Normal mood and affect.  Laboratory Data:  Lab Results  Component Value Date   CREATININE 1.10 (H) 02/21/2020   Ref Range & Units 2 mo ago   Glucose 70 - 110 mg/dL 98   Sodium 136 - 145 mmol/L 142   Potassium 3.6 - 5.1 mmol/L 4.3   Chloride 97 - 109 mmol/L 106   Carbon Dioxide (CO2) 22.0 - 32.0 mmol/L 27.8   Urea Nitrogen (  BUN) 7 - 25 mg/dL 18   Creatinine 0.6 - 1.1 mg/dL 1.3 High    Glomerular Filtration Rate (eGFR), MDRD Estimate >60 mL/min/1.73sq m 39 Low    Calcium 8.7 - 10.3 mg/dL 11.2 High    AST  8 - 39 U/L 14   ALT  5 - 38 U/L 9   Alk Phos (alkaline Phosphatase) 34 - 104 U/L 53   Albumin 3.5 - 4.8 g/dL 4.4   Bilirubin, Total 0.3 - 1.2 mg/dL 0.4   Protein, Total 6.1 - 7.9 g/dL 7.3   A/G Ratio 1.0 - 5.0 gm/dL 1.5   Resulting Agency  [facility]  Specimen Collected: 03/19/21 14:36 Last Resulted: 03/19/21 15:51  Received From: [facility]  Result Received: 03/23/21 07:44   Urinalysis Ref Range & Units 12 d ago   Color Yellow, Violet, Light Violet, Dark Violet Yellow   Clarity Clear, Other Clear   Specific Gravity 1.000 - 1.030 <=1.005   pH, Urine 5.0 - 8.0 6.5   Protein, Urinalysis Negative, Trace mg/dL Negative   Glucose, Urinalysis Negative mg/dL Negative   Ketones, Urinalysis Negative mg/dL Negative   Blood, Urinalysis Negative Negative   Nitrite, Urinalysis Negative Negative   Leukocyte Esterase, Urinalysis Negative Negative   White Blood Cells, Urinalysis None Seen, 0-3 /hpf None Seen   Red Blood Cells, Urinalysis None Seen, 0-3 /hpf None Seen   Bacteria, Urinalysis None Seen /hpf Rare Abnormal    Squamous Epithelial Cells, Urinalysis Rare, Few, None Seen /hpf Rare   Resulting Agency  [facility]  Specimen Collected: 05/07/21 11:25 Last Resulted: 05/07/21 11:57  Received From: [facility]  Result Received: 05/17/21 15:35    Pertinent Imaging:    Assessment &  Plan:     No follow-ups on file.  [facility] [address] [place], [place] [address] [phone]  I,Kailey Littlejohn,acting as a scribe for [facility], PA-C.,have documented all relevant documentation on the behalf of Anne MCGOWAN, PA-C,as directed by  [facility], PA-C while in the presence of [place], PA-C.

## 2021-05-20 ENCOUNTER — Ambulatory Visit: Payer: Medicare Other | Admitting: Urology

## 2021-05-21 ENCOUNTER — Encounter: Payer: Self-pay | Admitting: Urology

## 2021-06-08 NOTE — Progress Notes (Signed)
06/24/21 4:48 PM   Anne Steele October 01, 1940 [id number]  Referring provider:  Tracie Harrier, MD [address] [facility] [place],  [place] [address]  Chief Complaint  Patient presents with   Follow-up    Bladder and kidney pain, left flank pain    Urological history:  1. Dysuria  -cysto 03/2019 NED   2. Urge incontinence  -failed OAB agents     3. Benign cyst of bladder dome  -resected 2019     4. Bilateral renal cysts  -last imaged 2019-benign   5. Bilateral renal calculi  -last imaged 2018     6. Vaginal atrophy  -managed vaginal estrogen cream three nights   HPI: Anne Steele is a 80 y.o.female who presents today for further evaluation of bladder/kidney pain and left flank pain.   She is hurting in her bladder hole and when she moves she feels like something is trying to come out.  This feeling has been getting worse over the last several months.    She tried the vaginal estrogen cream, but she states that it made her hurt (swell).    She things her urinating is getting better.  She has noticed this as she doesn't urinate on herself as often.    UA 6-10 WBC's and moderate bacteria.  PVR 6 mL.   KUB left lower pole stone and pelvic phleboliths.      PMH: Past Medical History:  Diagnosis Date   Arthritis    Asthma    Bell palsy 1973   Bell's palsy    Benign bladder tumor    Depression    Fibromyalgia    GERD (gastroesophageal reflux disease)    Head ache    Hiatal hernia    History of kidney stones    Hypercalcemia    Hypertension    Kidney stones    Optic nerve disease    Shingles    Vitamin D deficiency     Surgical History: Past Surgical History:  Procedure Laterality Date   ABDOMINAL HYSTERECTOMY  1982   APPENDECTOMY     ARTERY BIOPSY N/A 02/24/2020   Procedure: BIOPSY TEMPORAL ARTERY;  Surgeon: Benjamine Sprague, DO;  Location: [facility] OR[facility];  Service: General;  Laterality: N/A;   BLADDER TUMOR EXCISION   08/2018   BREAST BIOPSY Right 03/04/2016   US biopsy/ INTRADUCTAL PAPILLOMA   BREAST BIOPSY Left 03/04/2016   Fibroadenoma   BREAST EXCISIONAL BIOPSY Left 03/05/2019   Inflamed sebaceous cyst of the left breast, excised by dr. Bary Castilla in office BENIGN EPIDERMAL INCLUSION CYST WITH POSSIBLE RUPTURE AND ASSOCIATED INFLAMMATORY RESPONSE   CARPAL TUNNEL RELEASE Bilateral 2012   CATARACT EXTRACTION Bilateral 2017   CHOLECYSTECTOMY  2001   COLONOSCOPY     CYST EXCISION     FINGER   CYSTOSCOPY W/ RETROGRADES Bilateral 08/29/2018   Procedure: CYSTOSCOPY WITH RETROGRADE PYELOGRAM;  Surgeon: Hollice Espy, MD;  Location: [facility];  Service: Urology;  Laterality: Bilateral;   CYSTOSCOPY WITH BIOPSY N/A 08/29/2018   Procedure: CYSTOSCOPY WITH Bladder BIOPSY;  Surgeon: Hollice Espy, MD;  Location: [facility];  Service: Urology;  Laterality: N/A;   ESOPHAGOGASTRODUODENOSCOPY     EYE SURGERY     KNEE SURGERY Left 2012   REFRACTIVE SURGERY Bilateral    SIGMOIDOSCOPY      Home Medications:  Allergies as of 06/09/2021       Reactions   Ivp Dye [iodinated Diagnostic Agents] Anaphylaxis   Penicillins Shortness Of Breath  Patient states it "brings her asthma back on"   Duloxetine Other (See Comments)   nightmares   Iodine Itching   Latanoprost    Blood shot eyes   Tetracyclines & Related Swelling   Trusopt [dorzolamide Hcl]    Zocor [simvastatin]    MUSCLE CRAMPS   Zovirax [acyclovir]    Betadine [povidone Iodine] Rash   Macrobid [nitrofurantoin] Rash   Sulfa Antibiotics Rash   Tape Rash        Medication List        Accurate as of June 09, 2021 11:59 PM. If you have any questions, ask your nurse or doctor.          acetaminophen 500 MG tablet Commonly known as: TYLENOL Take 1,000 mg by mouth 2 (two) times daily.   allopurinol 100 MG tablet Commonly known as: ZYLOPRIM Take 100 mg by mouth daily.   busPIRone 10 MG tablet Commonly known as: BUSPAR Take 10 mg by  mouth 2 (two) times daily.   cetirizine 10 MG tablet Commonly known as: ZYRTEC Take 10 mg by mouth daily.   cyanocobalamin 1000 MCG tablet Take 100 mcg by mouth daily.   Eliquis 5 MG Tabs tablet Generic drug: apixaban Take 1 tablet by mouth 2 (two) times daily.   enalapril 20 MG tablet Commonly known as: VASOTEC Take 20 mg by mouth daily.   fluticasone 50 MCG/ACT nasal spray Commonly known as: FLONASE Place 2 sprays into both nostrils daily.   gabapentin 100 MG capsule Commonly known as: NEURONTIN Take by mouth.   Gemtesa 75 MG Tabs Generic drug: Vibegron TAKE 1 TABLET BY MOUTH ONCE DAILY.   metoprolol succinate 25 MG 24 hr tablet Commonly known as: TOPROL-XL Take 25 mg by mouth daily.   nortriptyline 10 MG capsule Commonly known as: PAMELOR Take 10 mg by mouth at bedtime.   pantoprazole 40 MG tablet Commonly known as: PROTONIX Take 40 mg by mouth daily.   topiramate 25 MG tablet Commonly known as: TOPAMAX Take 25 mg by mouth daily.        Allergies:  Allergies  Allergen Reactions   Ivp Dye [Iodinated Diagnostic Agents] Anaphylaxis   Penicillins Shortness Of Breath    Patient states it "brings her asthma back on"   Duloxetine Other (See Comments)    nightmares   Iodine Itching   Latanoprost     Blood shot eyes   Tetracyclines & Related Swelling   Trusopt [Dorzolamide Hcl]    Zocor [Simvastatin]     MUSCLE CRAMPS   Zovirax [Acyclovir]    Betadine [Povidone Iodine] Rash   Macrobid [Nitrofurantoin] Rash   Sulfa Antibiotics Rash   Tape Rash    Family History: Family History  Problem Relation Age of Onset   Leukemia Mother    Diabetes Father    Breast cancer Sister    Brain cancer Brother    Diabetes Sister    Diabetes Brother    Prostate cancer Neg Hx    Kidney cancer Neg Hx    Bladder Cancer Neg Hx     Social History:  reports that she has never smoked. She has never used smokeless tobacco. She reports that she does not drink alcohol  and does not use drugs.   Physical Exam: BP (!) 157/83   Pulse 69   Ht 4' 11" (1.499 m)   Wt 165 lb (74.8 kg)   BMI 33.33 kg/m   Constitutional:  Well nourished. Alert and oriented, No acute distress. HEENT:  [place] AT, mask in place.  Trachea midline Cardiovascular: No clubbing, cyanosis, or edema. Respiratory: Normal respiratory effort, no increased work of breathing. GU: No CVA tenderness.  No bladder fullness or masses.  Atrophic external genitalia, normal pubic hair distribution, no lesions.  Normal urethral meatus, no lesions, no prolapse, no discharge.   No urethral masses, tenderness and/or tenderness. No bladder fullness, tenderness or masses. Pale vagina mucosa, poor estrogen effect, no discharge, no lesions, fair pelvic support, grade I cystocele and no rectocele noted. Anus and perineum are without rashes or lesions.     Neurologic: Grossly intact, no focal deficits, moving all 4 extremities. Psychiatric: Normal mood and affect.    Laboratory Data: Lab Results  Component Value Date   CREATININE 1.10 (H) 02/21/2020   Ref Range & Units 2 mo ago   Glucose 70 - 110 mg/dL 98   Sodium 136 - 145 mmol/L 142   Potassium 3.6 - 5.1 mmol/L 4.3   Chloride 97 - 109 mmol/L 106   Carbon Dioxide (CO2) 22.0 - 32.0 mmol/L 27.8   Urea Nitrogen (BUN) 7 - 25 mg/dL 18   Creatinine 0.6 - 1.1 mg/dL 1.3 High    Glomerular Filtration Rate (eGFR), MDRD Estimate >60 mL/min/1.73sq m 39 Low    Calcium 8.7 - 10.3 mg/dL 11.2 High    AST  8 - 39 U/L 14   ALT  5 - 38 U/L 9   Alk Phos (alkaline Phosphatase) 34 - 104 U/L 53   Albumin 3.5 - 4.8 g/dL 4.4   Bilirubin, Total 0.3 - 1.2 mg/dL 0.4   Protein, Total 6.1 - 7.9 g/dL 7.3   A/G Ratio 1.0 - 5.0 gm/dL 1.5   Resulting Agency  [facility]  Specimen Collected: 03/19/21 14:36 Last Resulted: 03/19/21 15:51  Received From: [facility]  Result Received: 03/23/21 07:44   Urinalysis Ref Range & Units 12 d ago   Color Yellow,  Violet, Light Violet, Dark Violet Yellow   Clarity Clear, Other Clear   Specific Gravity 1.000 - 1.030 <=1.005   pH, Urine 5.0 - 8.0 6.5   Protein, Urinalysis Negative, Trace mg/dL Negative   Glucose, Urinalysis Negative mg/dL Negative   Ketones, Urinalysis Negative mg/dL Negative   Blood, Urinalysis Negative Negative   Nitrite, Urinalysis Negative Negative   Leukocyte Esterase, Urinalysis Negative Negative   White Blood Cells, Urinalysis None Seen, 0-3 /hpf None Seen   Red Blood Cells, Urinalysis None Seen, 0-3 /hpf None Seen   Bacteria, Urinalysis None Seen /hpf Rare Abnormal    Squamous Epithelial Cells, Urinalysis Rare, Few, None Seen /hpf Rare   Resulting Agency  [facility]  Specimen Collected: 05/07/21 11:25 Last Resulted: 05/07/21 11:57  Received From: [facility]  Result Received: 05/17/21 15:35   Urinalysis Component     Latest Ref Rng & Units 06/09/2021  Specific Gravity, UA     1.005 - 1.030 <1.005 (L)  pH, UA     5.0 - 7.5 5.5  Color, UA     Yellow Straw  Appearance Ur     Clear Hazy (A)  Leukocytes,UA     Negative Trace (A)  Protein,UA     Negative/Trace Negative  Glucose, UA     Negative Negative  Ketones, UA     Negative Negative  RBC, UA     Negative Negative  Bilirubin, UA     Negative Negative  Urobilinogen, Ur     0.2 -  1.0 mg/dL 0.2  Nitrite, UA     Negative Negative  Microscopic Examination      See below:   Component     Latest Ref Rng & Units 06/09/2021  WBC, UA     0 - 5 /hpf 6-10 (A)  RBC     0 - 2 /hpf 0-2  Epithelial Cells (non renal)     0 - 10 /hpf 0-10  Bacteria, UA     None seen/Few Moderate (A)   I have reviewed the labs.   Pertinent Imaging: Results for JAZSMINE, MACARI (MRN [id number]) as of 06/09/2021 09:52  Ref. Range 06/09/2021 09:15  Scan Result Unknown 4m  CLINICAL DATA:  Flank pain history of kidney stone   EXAM: ABDOMEN - 1 VIEW   COMPARISON:  Ultrasound 05/30/2018, CT  10/21/2016   FINDINGS: Nonobstructed gas pattern. There are multiple left-sided kidney stones. The largest measures up to 9 mm in size. Stool artifact versus left paraspinal calcifications at L4. there are multiple pelvic calcifications which are probably phleboliths   IMPRESSION: 1. Multiple left-sided kidney stones. Rounded stool artifact versus left paraspinal calcifications at L4. If high suspicion for urinary tract stone, would correlate with CT KUB.     Electronically Signed   By: KDonavan FoilM.D.   On: 06/09/2021 21:45 I have independently reviewed the films.  See HPI.    Assessment & Plan:    1. Vaginal atrophy -encouraged her to apply the cream on Monday, Wednesday and Friday nights  -she continue to use vaginal lubricants daily for comfort -explained that the pelvic discomfort she is feeling may be due to the vaginal atrophy and to continue applying the cream and it will take up to 8 months to take effect   2. Mixed incontinence -refer to gynecology for pessary fitting to address her stress incontinence -asked her to bring her medication bottles with her next time so that we can determine what OAB agent she is taking  3. Bladder pain -Difficult to determine from patient's history whether this is vaginal or bladder pain -I will send the urine for culture to rule out any indolent infection and treat to see if this improves her pain  4. Left flank pain -will order CT renal stone study as she is having pain for further evaluation of the rounded stool artifact versus left paraspinal calcifications at L4  Return in about 3 months (around 09/08/2021) for symptom recheck .  [place] [place] [phone]

## 2021-06-09 ENCOUNTER — Other Ambulatory Visit: Payer: Self-pay | Admitting: Urology

## 2021-06-09 ENCOUNTER — Ambulatory Visit
Admission: RE | Admit: 2021-06-09 | Discharge: 2021-06-09 | Disposition: A | Discharge status: Home / Self Care | Payer: Medicare Other | Source: Ambulatory Visit | Attending: Urology | Admitting: Urology

## 2021-06-09 ENCOUNTER — Other Ambulatory Visit: Payer: Self-pay

## 2021-06-09 ENCOUNTER — Ambulatory Visit (INDEPENDENT_AMBULATORY_CARE_PROVIDER_SITE_OTHER): Payer: Medicare Other | Admitting: Urology

## 2021-06-09 ENCOUNTER — Encounter: Payer: Self-pay | Admitting: Urology

## 2021-06-09 VITALS — BP 157/83 | HR 69 | Ht 59.0 in | Wt 165.0 lb

## 2021-06-09 DIAGNOSIS — N393 Stress incontinence (female) (male): Secondary | ICD-10-CM | POA: Diagnosis not present

## 2021-06-09 DIAGNOSIS — R109 Unspecified abdominal pain: Secondary | ICD-10-CM | POA: Insufficient documentation

## 2021-06-09 DIAGNOSIS — R3989 Other symptoms and signs involving the genitourinary system: Secondary | ICD-10-CM

## 2021-06-09 LAB — BLADDER SCAN AMB NON-IMAGING

## 2021-06-09 NOTE — Patient Instructions (Signed)
Apply 0.5mg  (pea-sized amount)  just inside the vaginal introitus with a finger-tip on Monday, Wednesday and Friday nights,

## 2021-06-10 ENCOUNTER — Telehealth: Payer: Self-pay

## 2021-06-10 LAB — URINALYSIS, COMPLETE
Bilirubin, UA: NEGATIVE
Glucose, UA: NEGATIVE
Ketones, UA: NEGATIVE
Nitrite, UA: NEGATIVE
Protein,UA: NEGATIVE
RBC, UA: NEGATIVE
Specific Gravity, UA: <1.005 — ABNORMAL LOW (ref 1.005–1.030)
Urobilinogen, Ur: 0.2 mg/dL (ref 0.2–1.0)
pH, UA: 5.5 (ref 5.0–7.5)

## 2021-06-10 LAB — MICROSCOPIC EXAMINATION

## 2021-06-10 NOTE — Telephone Encounter (Signed)
[facility] referring for Stress incontinence, female.Attempt to reach patient via both numbers on file. [phone] is not accepting calls and [phone] voicemail isn't set up unable to leave message

## 2021-06-11 NOTE — Telephone Encounter (Signed)
Patient is scheduled for 06/17/21 with [facility]

## 2021-06-14 ENCOUNTER — Other Ambulatory Visit: Payer: Self-pay | Admitting: Urology

## 2021-06-14 DIAGNOSIS — R109 Unspecified abdominal pain: Secondary | ICD-10-CM

## 2021-06-14 LAB — CULTURE, URINE COMPREHENSIVE

## 2021-06-16 ENCOUNTER — Telehealth: Payer: Self-pay

## 2021-06-16 MED ORDER — CIPROFLOXACIN HCL 250 MG PO TABS: 250.0000 mg | ORAL_TABLET | Freq: Two times a day (BID) | ORAL | 0 refills | Status: DC

## 2021-06-16 NOTE — Telephone Encounter (Signed)
-----   Message from Nori Riis, PA-C sent at 06/14/2021  8:21 AM EDT ----- Please let Mrs. Norden know that the radiologist may have seen some stones on her Xray, so we need to get a CT scan for conformation.  Orders are in.

## 2021-06-16 NOTE — Telephone Encounter (Signed)
Patient called back and was notified of both result messages. Script for Cipro was sent into local pharmacy. Patient verbalized understanding and states that her CTscan has been scheduled for 06/28/21.

## 2021-06-16 NOTE — Telephone Encounter (Signed)
-----   Message from Nori Riis, PA-C sent at 06/14/2021  2:48 PM EDT ----- Please let Anne Steele know that her urine culture was positive and she needs to start Cipro 250 mg twice daily for seven days.

## 2021-06-17 ENCOUNTER — Other Ambulatory Visit: Payer: Self-pay

## 2021-06-17 ENCOUNTER — Ambulatory Visit: Payer: Medicare Other | Admitting: Obstetrics & Gynecology

## 2021-06-17 ENCOUNTER — Encounter: Payer: Self-pay | Admitting: Obstetrics & Gynecology

## 2021-06-17 VITALS — BP 122/82 | Ht 59.0 in | Wt 161.0 lb

## 2021-06-17 DIAGNOSIS — N952 Postmenopausal atrophic vaginitis: Secondary | ICD-10-CM | POA: Diagnosis not present

## 2021-06-17 DIAGNOSIS — N3281 Overactive bladder: Secondary | ICD-10-CM | POA: Diagnosis not present

## 2021-06-17 NOTE — Progress Notes (Signed)
Gynecology Evaluation   Chief Complaint: Leakage of urine  History of Present Illness:   Patient is a 80 y.o. G2P0010, presents today for a problem visit.  The patient is menopausal. She complains of urinary symptoms of dysuria, urinary frequency, and urinary incontinence. Also c/o burning or cutting like pain in her bladder more so than hjer vagina. Patient reports her symptoms began several months ago and its severity is described as severe.  Her symptoms include burning with urination, frequency, and incontinence.  She is not having leakage of urine with stressful activities, such as coughing, sneezing, laughter, or exercise.  She is having leakage of urine based on symptoms of urgency or bladder spasm/discomfort.  She is not having nocturia, with 1 episodes per night.  She is using pads or diapers daily for control of symptoms. She is having dysuria.  She has had a history of UTI.  She drinks about 3 caffeinated drinks per day.  She has not had prior procedures for incontinence.  PMHx: She  has a past medical history of Arthritis, Asthma, Bell palsy (1973), Bell's palsy, Benign bladder tumor, Depression, Fibromyalgia, GERD (gastroesophageal reflux disease), Head ache, Hiatal hernia, History of kidney stones, Hypercalcemia, Hypertension, Kidney stones, Optic nerve disease, Shingles, and Vitamin D deficiency. Also,  has a past surgical history that includes Carpal tunnel release (Bilateral, 2012); Abdominal hysterectomy (1982); Cholecystectomy (2001); Cataract extraction (Bilateral, 2017); Knee surgery (Left, 2012); Cystoscopy with biopsy (N/A, 08/29/2018); Cystoscopy w/ retrogrades (Bilateral, 08/29/2018); Breast biopsy (Right, 03/04/2016); Breast biopsy (Left, 03/04/2016); Breast excisional biopsy (Left, 03/05/2019); Refractive surgery (Bilateral); Eye surgery; Appendectomy; Colonoscopy; Bladder tumor excision (08/2018); Cyst excision; Esophagogastroduodenoscopy; Sigmoidoscopy; and Artery Biopsy (N/A,  02/24/2020)., family history includes Brain cancer in her brother; Breast cancer in her sister; Diabetes in her brother, father, and sister; Leukemia in her mother.,  reports that she has never smoked. She has never used smokeless tobacco. She reports that she does not drink alcohol and does not use drugs.  She has a current medication list which includes the following prescription(s): acetaminophen, allopurinol, buspirone, cetirizine, ciprofloxacin, cyanocobalamin, eliquis, enalapril, fluticasone, gabapentin, gemtesa, metoprolol succinate, nortriptyline, pantoprazole, and topiramate. Also, is allergic to ivp dye [iodinated diagnostic agents], penicillins, duloxetine, iodine, latanoprost, tetracyclines & related, trusopt [dorzolamide hcl], zocor [simvastatin], zovirax [acyclovir], betadine [povidone iodine], macrobid [nitrofurantoin], sulfa antibiotics, and tape.  Review of Systems  All other systems reviewed and are negative.  Objective: BP 122/82   Ht 4\' 11"  (1.499 m)   Wt 161 lb (73 kg)   BMI 32.52 kg/m  Physical Exam Constitutional:      General: She is not in acute distress.    Appearance: She is well-developed.  Genitourinary:     Bladder and urethral meatus normal.     Genitourinary Comments: Cuff intact/ no lesions  Absent uterus and cervix     Vaginal cuff intact.    No vaginal erythema or bleeding.     No vaginal prolapse present.    Moderate vaginal atrophy present.     Right Adnexa: not tender and no mass present.    Left Adnexa: not tender and no mass present.    Cervix is absent.     Uterus is absent.     Bladder exam comments: Min cystocele.     Pelvic exam was performed with patient in the lithotomy position.  HENT:     Head: Normocephalic and atraumatic.     Nose: Nose normal.  Abdominal:     General: There is no  distension.     Palpations: Abdomen is soft.     Tenderness: There is no abdominal tenderness.  Musculoskeletal:        General: Normal range of  motion.  Neurological:     Mental Status: She is alert and oriented to person, place, and time.     Cranial Nerves: No cranial nerve deficit.  Skin:    General: Skin is warm and dry.  Psychiatric:        Attention and Perception: Attention normal.        Mood and Affect: Mood normal.        Speech: Speech normal.        Behavior: Behavior normal.        Cognition and Memory: Cognition normal.        Judgment: Judgment normal.    Female chaperone present for pelvic portion of the physical exam  Assessment: 80 y.o. G2P0010 for urologic evaluation of incontinence 1. Overactive bladder She is also having recurrent UTI recently, which adds to incontinence. She may need this to be examined further She is on [place] already (since July) as a tx for OAB She does not have a cystocele on exam today, and as her GSI sx's are min-none, I do not foresee a pessary helping her (more risk of side effect than reward).  2. Vaginal atrophy She is not sexually active If she does not feel there is benefit after taking the vag estrogen for several weeks, then she does not need to continue  Barnett Applebaum, MD, [facility], [facility] 06/17/2021  9:14 AM

## 2021-06-17 NOTE — Patient Instructions (Signed)

## 2021-06-28 ENCOUNTER — Other Ambulatory Visit: Payer: Self-pay

## 2021-06-28 ENCOUNTER — Ambulatory Visit
Admission: RE | Admit: 2021-06-28 | Discharge: 2021-06-28 | Disposition: A | Discharge status: Home / Self Care | Payer: Medicare Other | Source: Ambulatory Visit | Attending: Urology | Admitting: Urology

## 2021-06-28 DIAGNOSIS — R109 Unspecified abdominal pain: Secondary | ICD-10-CM | POA: Diagnosis not present

## 2021-06-30 ENCOUNTER — Telehealth: Payer: Self-pay | Admitting: Urology

## 2021-06-30 NOTE — Telephone Encounter (Signed)
Pt has some additional questions concerning her CT results.

## 2021-07-01 NOTE — Telephone Encounter (Signed)
Spoke with patient about CT again, pt is fine.

## 2021-07-12 ENCOUNTER — Other Ambulatory Visit: Payer: Self-pay | Admitting: Internal Medicine

## 2021-07-12 DIAGNOSIS — Z1231 Encounter for screening mammogram for malignant neoplasm of breast: Secondary | ICD-10-CM

## 2021-07-13 ENCOUNTER — Other Ambulatory Visit: Payer: Self-pay | Admitting: Urology

## 2021-09-06 ENCOUNTER — Ambulatory Visit: Payer: Medicare Other | Admitting: Urology

## 2021-09-08 ENCOUNTER — Other Ambulatory Visit: Payer: Self-pay | Admitting: Urology

## 2021-09-08 NOTE — Telephone Encounter (Signed)
Pt canceled appt on 09/06/2021 and now asking for refill of gemtesa, ok to fill?

## 2022-02-01 ENCOUNTER — Ambulatory Visit
Admission: RE | Admit: 2022-02-01 | Discharge: 2022-02-01 | Disposition: A | Discharge status: Home / Self Care | Payer: Medicare Other | Source: Ambulatory Visit | Attending: Internal Medicine | Admitting: Internal Medicine

## 2022-02-01 DIAGNOSIS — Z1231 Encounter for screening mammogram for malignant neoplasm of breast: Secondary | ICD-10-CM | POA: Diagnosis not present

## 2023-01-26 ENCOUNTER — Ambulatory Visit
Admission: RE | Admit: 2023-01-26 | Discharge: 2023-01-26 | Disposition: A | Discharge status: Home / Self Care | Payer: Medicare Other | Source: Ambulatory Visit | Attending: Internal Medicine | Admitting: Internal Medicine

## 2023-01-26 ENCOUNTER — Other Ambulatory Visit: Payer: Self-pay

## 2023-01-26 DIAGNOSIS — M25461 Effusion, right knee: Secondary | ICD-10-CM

## 2023-01-26 DIAGNOSIS — M79604 Pain in right leg: Secondary | ICD-10-CM | POA: Diagnosis present

## 2023-04-04 ENCOUNTER — Other Ambulatory Visit: Payer: Self-pay | Admitting: Internal Medicine

## 2023-04-04 DIAGNOSIS — Z1231 Encounter for screening mammogram for malignant neoplasm of breast: Secondary | ICD-10-CM

## 2023-04-12 ENCOUNTER — Ambulatory Visit
Admission: RE | Admit: 2023-04-12 | Discharge: 2023-04-12 | Disposition: A | Discharge status: Home / Self Care | Payer: Medicare Other | Source: Ambulatory Visit | Attending: Internal Medicine | Admitting: Internal Medicine

## 2023-04-12 DIAGNOSIS — Z1231 Encounter for screening mammogram for malignant neoplasm of breast: Secondary | ICD-10-CM | POA: Insufficient documentation

## 2023-11-06 ENCOUNTER — Emergency Department: Payer: Medicare Other

## 2023-11-06 ENCOUNTER — Other Ambulatory Visit: Payer: Self-pay

## 2023-11-06 ENCOUNTER — Ambulatory Visit
Admission: EM | Admit: 2023-11-06 | Discharge: 2023-11-06 | Payer: Medicare Other | Attending: Family Medicine | Admitting: Family Medicine

## 2023-11-06 DIAGNOSIS — R519 Headache, unspecified: Secondary | ICD-10-CM

## 2023-11-06 DIAGNOSIS — I10 Essential (primary) hypertension: Secondary | ICD-10-CM | POA: Diagnosis not present

## 2023-11-06 DIAGNOSIS — R8281 Pyuria: Principal | ICD-10-CM | POA: Insufficient documentation

## 2023-11-06 DIAGNOSIS — S2020XA Contusion of thorax, unspecified, initial encounter: Secondary | ICD-10-CM | POA: Insufficient documentation

## 2023-11-06 DIAGNOSIS — W182XXA Fall in (into) shower or empty bathtub, initial encounter: Secondary | ICD-10-CM | POA: Diagnosis not present

## 2023-11-06 DIAGNOSIS — E213 Hyperparathyroidism, unspecified: Secondary | ICD-10-CM | POA: Diagnosis not present

## 2023-11-06 DIAGNOSIS — M797 Fibromyalgia: Secondary | ICD-10-CM | POA: Diagnosis not present

## 2023-11-06 DIAGNOSIS — I129 Hypertensive chronic kidney disease with stage 1 through stage 4 chronic kidney disease, or unspecified chronic kidney disease: Secondary | ICD-10-CM | POA: Diagnosis not present

## 2023-11-06 DIAGNOSIS — R7989 Other specified abnormal findings of blood chemistry: Secondary | ICD-10-CM | POA: Insufficient documentation

## 2023-11-06 DIAGNOSIS — R0781 Pleurodynia: Secondary | ICD-10-CM | POA: Diagnosis not present

## 2023-11-06 DIAGNOSIS — W19XXXA Unspecified fall, initial encounter: Secondary | ICD-10-CM | POA: Diagnosis not present

## 2023-11-06 DIAGNOSIS — Z79899 Other long term (current) drug therapy: Secondary | ICD-10-CM | POA: Diagnosis not present

## 2023-11-06 DIAGNOSIS — J449 Chronic obstructive pulmonary disease, unspecified: Secondary | ICD-10-CM | POA: Diagnosis not present

## 2023-11-06 DIAGNOSIS — N1831 Chronic kidney disease, stage 3a: Secondary | ICD-10-CM | POA: Insufficient documentation

## 2023-11-06 DIAGNOSIS — R531 Weakness: Secondary | ICD-10-CM | POA: Diagnosis present

## 2023-11-06 DIAGNOSIS — E1122 Type 2 diabetes mellitus with diabetic chronic kidney disease: Secondary | ICD-10-CM | POA: Insufficient documentation

## 2023-11-06 DIAGNOSIS — Z7901 Long term (current) use of anticoagulants: Secondary | ICD-10-CM | POA: Diagnosis not present

## 2023-11-06 DIAGNOSIS — N390 Urinary tract infection, site not specified: Secondary | ICD-10-CM | POA: Insufficient documentation

## 2023-11-06 LAB — URINALYSIS, ROUTINE W REFLEX MICROSCOPIC
Bilirubin Urine: NEGATIVE
Glucose, UA: NEGATIVE mg/dL
Ketones, ur: NEGATIVE mg/dL
Nitrite: NEGATIVE
Protein, ur: NEGATIVE mg/dL
Specific Gravity, Urine: 1.004 — ABNORMAL LOW (ref 1.005–1.030)
Squamous Epithelial / HPF: 0 /[HPF] (ref 0–5)
WBC, UA: >50 WBC/hpf (ref 0–5)
pH: 6 (ref 5.0–8.0)

## 2023-11-06 LAB — BASIC METABOLIC PANEL
Anion gap: 12 (ref 5–15)
BUN: 21 mg/dL (ref 8–23)
CO2: 25 mmol/L (ref 22–32)
Calcium: 11.9 mg/dL — ABNORMAL HIGH (ref 8.9–10.3)
Chloride: 101 mmol/L (ref 98–111)
Creatinine, Ser: 1.19 mg/dL — ABNORMAL HIGH (ref 0.44–1.00)
GFR, Estimated: 46 mL/min — ABNORMAL LOW (ref >60)
Glucose, Bld: 116 mg/dL — ABNORMAL HIGH (ref 70–99)
Potassium: 4 mmol/L (ref 3.5–5.1)
Sodium: 138 mmol/L (ref 135–145)

## 2023-11-06 LAB — CBC
HCT: 39.1 % (ref 36.0–46.0)
Hemoglobin: 13 g/dL (ref 12.0–15.0)
MCH: 31.4 pg (ref 26.0–34.0)
MCHC: 33.2 g/dL (ref 30.0–36.0)
MCV: 94.4 fL (ref 80.0–100.0)
Platelets: 271 10*3/uL (ref 150–400)
RBC: 4.14 MIL/uL (ref 3.87–5.11)
RDW: 13.2 % (ref 11.5–15.5)
WBC: 12.7 10*3/uL — ABNORMAL HIGH (ref 4.0–10.5)
nRBC: 0 % (ref 0.0–0.2)

## 2023-11-06 LAB — TROPONIN I (HIGH SENSITIVITY)
Troponin I (High Sensitivity): 24 ng/L — ABNORMAL HIGH (ref <18)
Troponin I (High Sensitivity): 21 ng/L — ABNORMAL HIGH (ref <18)

## 2023-11-06 NOTE — ED Triage Notes (Signed)
 Pt to ED via POV c/o weakness and fall. Pt was in bathroom when she felt weak and started drifting woards the right side, falling and hitting right side of ribcage on bathtub. Around 2:15pm. Pt reports feeling weak for the past couple days. Denies hitting head. Pt reports feeling weaker on right side. Pt has slight facial droop, equal grip strength bilaterally and no extremity drift. Denies any blurry vision, slurred speech. Pt discussed with Anner Crete MD

## 2023-11-06 NOTE — ED Notes (Signed)
 Patient is being discharged from the Urgent Care and sent to the Emergency Department via POV . Per Dr.Brimage, patient is in need of higher level of care due to HA & facial changes. Patient is aware and verbalizes understanding of plan of care.  Vitals:   11/06/23 1902  BP: (!) 181/95  Pulse: 66  Resp: 19  Temp: 98.4 F (36.9 C)  SpO2: 97%

## 2023-11-06 NOTE — Discharge Instructions (Signed)
 Given your headache, facial changes, fall and rib pain, you have been advised to follow up immediately in the emergency department for concerning signs or symptoms as discussed during your visit. If you declined EMS transport, please have a family member take you directly to the ED at this time. Do not delay.   Based on concerns about condition, if you do not follow up in the ED, you may risk poor outcomes including worsening of condition, delayed treatment and potentially life threatening issues. If you have declined to go to the ED at this time, you should call your PCP immediately to set up a follow up appointment.   Go to ED for red flag symptoms, including; fevers you cannot reduce with Tylenol/Motrin, severe headaches, vision changes, numbness/weakness in part of the body, lethargy, confusion, intractable vomiting, severe dehydration, chest pain, breathing difficulty, severe persistent abdominal or pelvic pain, signs of severe infection (increased redness, swelling of an area), feeling faint or passing out, dizziness, etc. You should especially go to the ED for sudden acute worsening of condition if you do not elect to go at this time.

## 2023-11-06 NOTE — ED Triage Notes (Signed)
 Patient fell in bathtub around 2 today. Ems checked her out and today her to come to UC.  They checked her BP and told her it was high to come here. Right side pain

## 2023-11-06 NOTE — ED Provider Notes (Signed)
 [facility]    CSN: [id number] Arrival date & time: 11/06/23  1713      History   Chief Complaint Chief Complaint  Patient presents with   Fall    HPI  HPI Anne Steele is a 83 y.o. female.   Anne Steele presents after a fall today around 230 PM.  She fell against the side of the tub.  She is unsure what caused her to fall.  She has "spurs" in the back of her neck. No one was home when she fell. She lives alone. Denies hitting her head.  She felt lightheaded and "funny" in her head. She hasn't been feeling right over the past couple of days. Has been having troulbe with her urine and thinks she may have a UTI.    She no longer takes Eliquis.  Blood pressures have been running high at home.    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/2023   Chronic kidney disease, stage 3b (HCC) 11/07/2023   Primary hyperparathyroidism (HCC) 11/07/2023   Chest wall contusion, right, initial encounter 11/07/2023   AKI (acute kidney injury) (HCC) 11/07/2023   Stage 3a chronic kidney disease (HCC) 12/10/2019   Sebaceous cyst of skin of left breast 03/06/2019   Atrophic vaginitis 10/17/2018   Lumbar radiculopathy 05/01/2018   Major depressive disorder, recurrent, mild (HCC) 05/01/2018   DDD (degenerative disc disease), lumbar 06/09/2017   Obesity (BMI 30.0-34.9) 06/09/2017   Chronic kidney disease 10/11/2016   Depression  10/11/2016   Fibromyalgia 10/11/2016   History of kidney stones 10/11/2016   Pure hypercholesterolemia 10/11/2016   Leg cramps 04/06/2016   Papilloma of breast 03/15/2016   Fibroadenoma of left breast 03/15/2016   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 01/31/2016   Headache disorder 01/31/2016   Hypercalcemia 12/29/2015   GERD (gastroesophageal reflux disease) 06/24/2015   HTN (hypertension), benign 05/01/2014   Sprain of shoulder joint 02/27/2014    Past Surgical History:  Procedure Laterality Date   ABDOMINAL HYSTERECTOMY  1982   APPENDECTOMY     ARTERY BIOPSY N/A 02/24/2020   Procedure: BIOPSY TEMPORAL ARTERY;  Surgeon: Sung Amabile, DO;  Location: [facility];  Service: General;  Laterality: N/A;   BLADDER TUMOR EXCISION  08/2018   BREAST BIOPSY Right 03/04/2016   US biopsy/ INTRADUCTAL PAPILLOMA   BREAST BIOPSY Left 03/04/2016   Fibroadenoma   BREAST EXCISIONAL BIOPSY Left 03/05/2019   Inflamed sebaceous cyst of the left breast, excised by dr. Lemar Livings in office BENIGN EPIDERMAL INCLUSION CYST WITH POSSIBLE RUPTURE AND ASSOCIATED INFLAMMATORY RESPONSE   CARPAL TUNNEL RELEASE Bilateral 2012   CATARACT EXTRACTION Bilateral 2017   CHOLECYSTECTOMY  2001   COLONOSCOPY     CYST EXCISION     FINGER   CYSTOSCOPY  W/ RETROGRADES Bilateral 08/29/2018   Procedure: CYSTOSCOPY WITH RETROGRADE PYELOGRAM;  Surgeon: Vanna Scotland, MD;  Location: [facility];  Service: Urology;  Laterality: Bilateral;   CYSTOSCOPY WITH BIOPSY N/A 08/29/2018   Procedure: CYSTOSCOPY WITH Bladder BIOPSY;  Surgeon: Vanna Scotland, MD;  Location: [facility];  Service: Urology;  Laterality: N/A;   ESOPHAGOGASTRODUODENOSCOPY     EYE SURGERY     KNEE SURGERY Left 2012   REFRACTIVE SURGERY Bilateral    SIGMOIDOSCOPY      OB History     Gravida  2   Para  1   Term      Preterm      AB  1   Living         SAB  1   IAB      Ectopic      Multiple      Live Births           Obstetric Comments  1st  Menstrual Cycle:  14  1st Pregnancy:  24           Home Medications    Prior to Admission medications   Medication Sig Start Date End Date Taking? Authorizing Provider  acetaminophen (TYLENOL) 500 MG tablet Take 1,000 mg by mouth 2 (two) times daily.   Yes [provider]  allopurinol (ZYLOPRIM) 100 MG tablet Take 100 mg by mouth daily.   Yes [provider]  busPIRone (BUSPAR) 10 MG tablet Take 10 mg by mouth 2 (two) times daily.   Yes [provider]  cyanocobalamin 1000 MCG tablet Take 100 mcg by mouth daily.   Yes [provider]  ELIQUIS 5 MG TABS tablet Take 1 tablet by mouth 2 (two) times daily. Patient not taking: Reported on 11/07/2023 12/18/20  Yes [provider]  enalapril (VASOTEC) 20 MG tablet Take 20 mg by mouth daily.   Yes [provider]  fluticasone (FLONASE) 50 MCG/ACT nasal spray Place 2 sprays into both nostrils daily. Patient not taking: Reported on 11/07/2023   Yes [provider]  gabapentin (NEURONTIN) 100 MG capsule Take by mouth. Patient not taking: Reported on 11/07/2023 06/30/20  Yes [provider]  metoprolol succinate (TOPROL-XL) 25 MG 24 hr tablet Take 25 mg by mouth daily. Patient not taking: Reported on 11/07/2023   Yes [provider]  nortriptyline (PAMELOR) 10 MG capsule Take 10 mg by mouth at bedtime. Patient not taking: Reported on 11/07/2023 11/17/20  Yes [provider]  pantoprazole (PROTONIX) 40 MG tablet Take 40 mg by mouth daily. 05/30/20  Yes [provider]  topiramate (TOPAMAX) 25 MG tablet Take 25 mg by mouth daily.   Yes [provider]  ciprofloxacin (CIPRO) 250 MG tablet Take 1 tablet (250 mg total) by mouth 2 (two) times daily for 7 days. 11/07/23 11/14/23  Delfino Lovett, MD  lidocaine (LIDODERM) 5 % Place 1 patch onto the skin every 12 (twelve) hours for 10 days. Remove & Discard patch within 12 hours or as directed by MD 11/07/23 11/17/23   Delfino Lovett, MD  oxybutynin (DITROPAN) 5 MG tablet Take 5 mg by mouth once. Once daily    [provider]  rOPINIRole (REQUIP) 0.5 MG tablet Take 0.5 mg by mouth at bedtime.    [provider]  traMADol (ULTRAM) 50 MG tablet Take 1 tablet (50 mg total) by mouth every 6 (six) hours as needed for up to 5 days for moderate pain (pain score 4-6). 11/07/23 11/12/23  Delfino Lovett, MD    Family History Family History  Problem Relation Age of Onset   Leukemia Mother    Diabetes Father    Breast cancer Sister    Brain cancer Brother    Diabetes Sister    Diabetes Brother    Prostate cancer Neg Hx    Kidney cancer Neg Hx    Bladder Cancer Neg Hx     Social History Social History   Tobacco Use   Smoking status: Never   Smokeless tobacco: Never  Vaping Use   Vaping status: Never Used  Substance Use Topics   Alcohol use: No    Alcohol/week: 0.0 standard drinks of alcohol   Drug use: No     Allergies   Ivp dye [iodinated contrast media], Penicillins, Amitriptyline, Ciprofloxacin, Duloxetine, Iodine, Latanoprost, Tetracyclines & related, Trusopt [dorzolamide hcl], Zocor [simvastatin], Zovirax [acyclovir], Benzalkonium chloride, Betadine [povidone iodine], Macrobid [nitrofurantoin], Sulfa antibiotics, and Tape   Review of Systems Review of Systems: :negative unless otherwise stated in HPI.      Physical Exam Triage Vital Signs ED Triage Vitals  Encounter Vitals Group     BP 11/06/23 1902 (!) 181/95     Systolic BP Percentile --      Diastolic BP Percentile --      Pulse Rate 11/06/23 1902 66     Resp 11/06/23 1902 19     Temp 11/06/23 1902 98.4 F (36.9 C)     Temp Source 11/06/23 1902 Oral     SpO2 11/06/23 1902 97 %     Weight --      Height --      Head Circumference --      Peak Flow --      Pain Score 11/06/23 1858 10     Pain Loc --      Pain Education --      Exclude from Growth Chart --    No data found.  Updated Vital Signs BP (!) 181/95  (BP Location: Left Arm)   Pulse 66   Temp 98.4 F (36.9 C) (Oral)   Resp 19   SpO2 97%   Visual Acuity Right Eye Distance:   Left Eye Distance:   Bilateral Distance:    Right Eye Near:   Left Eye Near:    Bilateral Near:     Physical Exam GEN: uncomfortable appearing elderly female  CV: regular rate and rhythm RESP: no increased work of breathing, clear to ascultation bilaterally CHEST WALL:  right flank and anterior lower chest wall tenderness to light palpation  MSK: no edema, or calf tenderness SKIN: warm, dry, no rash on visible skin NEURO: alert, normal speech, facial droop on left (unsure if new however pt with hx of Bells Palsy), normal UE and LE strength      UC Treatments / Results  Labs (all labs ordered are listed, but only abnormal results are displayed) Labs Reviewed - No data to display  EKG   Radiology    Procedures Procedures (including critical care time)  Medications Ordered in UC Medications - No data to display  Initial Impression / Assessment and Plan / UC Course  I have reviewed the triage vital signs and the nursing notes.  Pertinent labs & imaging results that were available during my care of the patient were reviewed by me and considered in my medical decision making (see chart for details).      Pt is a 83 y.o.  female with history of COPD,  DM, CKD, HTN and Bells Palsy who presents for hypertension and a fall today. She is unsure how she fell. She has been having lightheadedness with neck pain and posterior headache. She has left side facial abnormalities but has history of Bells Palsy.  She has right sided chest wall tenderness.  She is significantly hypertensive here at 181/95.  She was seen by EMS have the fall and advised to go to the ED or UC for further evaluation. Reports taking her BP medications as prescribed.  She left the ED as it was too long of a wait.   Recommended ED evaluation as I am not able to perform head imaging  here.  Urgent care work up truncated. Reasons for higher level of care discussed in detail with patient and her daughter.  Understanding voiced.  Pt's daughter will drive her back to [facility] ED for further evaluation. Called and spoke with [facility] ED triage RN regarding pt's symptoms and RN will await her ED arrival.    Discussed MDM, treatment plan and plan for follow-up with patient/daughter  who agrees with plan.   Final Clinical Impressions(s) / UC Diagnoses   Final diagnoses:  Fall, initial encounter  Acute nonintractable headache, unspecified headache type  Rib pain on right side  Elevated blood pressure reading with diagnosis of hypertension     Discharge Instructions      Given your headache, facial changes, fall and rib pain, you have been advised to follow up immediately in the emergency department for concerning signs or symptoms as discussed during your visit. If you declined EMS transport, please have a family member take you directly to the ED at this time. Do not delay.   Based on concerns about condition, if you do not follow up in the ED, you may risk poor outcomes including worsening of condition, delayed treatment and potentially life threatening issues. If you have declined to go to the ED at this time, you should call your PCP immediately to set up a follow up appointment.   Go to ED for red flag symptoms, including; fevers you cannot reduce with Tylenol/Motrin, severe headaches, vision changes, numbness/weakness in part of the body, lethargy, confusion, intractable vomiting, severe dehydration, chest pain, breathing difficulty, severe persistent abdominal or pelvic pain, signs of severe infection (increased redness, swelling of an area), feeling faint or passing out, dizziness, etc. You should especially go to the ED for sudden acute worsening of condition if you do not elect to go at this time.       ED Prescriptions   None    PDMP not reviewed this encounter.    Katha Cabal, DO 11/10/23 1125

## 2023-11-07 ENCOUNTER — Observation Stay: Payer: Medicare Other

## 2023-11-07 ENCOUNTER — Other Ambulatory Visit: Payer: Self-pay

## 2023-11-07 ENCOUNTER — Observation Stay
Admission: EM | Admit: 2023-11-07 | Discharge: 2023-11-07 | Disposition: A | Discharge status: Home Health | Payer: Medicare Other | Attending: Internal Medicine | Admitting: Internal Medicine

## 2023-11-07 DIAGNOSIS — S20211A Contusion of right front wall of thorax, initial encounter: Secondary | ICD-10-CM

## 2023-11-07 DIAGNOSIS — R8281 Pyuria: Principal | ICD-10-CM

## 2023-11-07 DIAGNOSIS — M797 Fibromyalgia: Secondary | ICD-10-CM | POA: Diagnosis present

## 2023-11-07 DIAGNOSIS — E1122 Type 2 diabetes mellitus with diabetic chronic kidney disease: Secondary | ICD-10-CM | POA: Diagnosis present

## 2023-11-07 DIAGNOSIS — N179 Acute kidney failure, unspecified: Secondary | ICD-10-CM | POA: Diagnosis not present

## 2023-11-07 DIAGNOSIS — W19XXXA Unspecified fall, initial encounter: Secondary | ICD-10-CM

## 2023-11-07 DIAGNOSIS — N1831 Chronic kidney disease, stage 3a: Secondary | ICD-10-CM | POA: Diagnosis present

## 2023-11-07 DIAGNOSIS — R531 Weakness: Secondary | ICD-10-CM | POA: Diagnosis not present

## 2023-11-07 DIAGNOSIS — E210 Primary hyperparathyroidism: Secondary | ICD-10-CM

## 2023-11-07 DIAGNOSIS — N390 Urinary tract infection, site not specified: Secondary | ICD-10-CM

## 2023-11-07 DIAGNOSIS — R7989 Other specified abnormal findings of blood chemistry: Secondary | ICD-10-CM

## 2023-11-07 DIAGNOSIS — N1832 Chronic kidney disease, stage 3b: Secondary | ICD-10-CM | POA: Insufficient documentation

## 2023-11-07 LAB — HEMOGLOBIN A1C
Hgb A1c MFr Bld: 5.4 % (ref 4.8–5.6)
Mean Plasma Glucose: 108.28 mg/dL

## 2023-11-07 MED ORDER — INSULIN ASPART 100 UNIT/ML IJ SOLN: 0.0000 [IU] | Freq: Every day | INTRAMUSCULAR | Status: DC

## 2023-11-07 MED ORDER — ACETAMINOPHEN 650 MG RE SUPP: 650.0000 mg | Freq: Four times a day (QID) | RECTAL | Status: DC | PRN

## 2023-11-07 MED ORDER — KETOROLAC TROMETHAMINE 15 MG/ML IJ SOLN
15.0000 mg | Freq: Four times a day (QID) | INTRAMUSCULAR | Status: DC | PRN
  Administered 2023-11-07: 15 mg via INTRAVENOUS
  Filled 2023-11-07: qty 1

## 2023-11-07 MED ORDER — SODIUM CHLORIDE 0.9 % IV SOLN
1.0000 g | Freq: Three times a day (TID) | INTRAVENOUS | Status: DC
  Administered 2023-11-07: 1 g via INTRAVENOUS
  Filled 2023-11-07 (×3): qty 5

## 2023-11-07 MED ORDER — ONDANSETRON HCL 4 MG/2ML IJ SOLN
4.0000 mg | Freq: Once | INTRAMUSCULAR | Status: AC
  Administered 2023-11-07: 4 mg via INTRAVENOUS
  Filled 2023-11-07: qty 2

## 2023-11-07 MED ORDER — OXYCODONE HCL 5 MG PO TABS
5.0000 mg | ORAL_TABLET | Freq: Four times a day (QID) | ORAL | Status: DC | PRN
  Filled 2023-11-07: qty 1

## 2023-11-07 MED ORDER — CIPROFLOXACIN HCL 250 MG PO TABS
250.0000 mg | ORAL_TABLET | Freq: Two times a day (BID) | ORAL | 0 refills | Status: AC
Start: 2023-11-07 — End: 2023-11-14
  Filled 2023-11-07: qty 14, 7d supply, fill #0

## 2023-11-07 MED ORDER — LIDOCAINE 5 % EX PTCH
1.0000 | MEDICATED_PATCH | Freq: Two times a day (BID) | CUTANEOUS | 0 refills | Status: AC
  Filled 2023-11-07: qty 20, 10d supply, fill #0

## 2023-11-07 MED ORDER — ONDANSETRON HCL 4 MG/2ML IJ SOLN: 4.0000 mg | Freq: Four times a day (QID) | INTRAMUSCULAR | Status: DC | PRN

## 2023-11-07 MED ORDER — INSULIN ASPART 100 UNIT/ML IJ SOLN: 0.0000 [IU] | Freq: Three times a day (TID) | INTRAMUSCULAR | Status: DC

## 2023-11-07 MED ORDER — FOSFOMYCIN TROMETHAMINE 3 G PO PACK
3.0000 g | PACK | Freq: Once | ORAL | Status: AC
  Administered 2023-11-07: 3 g via ORAL
  Filled 2023-11-07: qty 3

## 2023-11-07 MED ORDER — TRAMADOL HCL 50 MG PO TABS
50.0000 mg | ORAL_TABLET | ORAL | Status: AC
  Administered 2023-11-07: 50 mg via ORAL
  Filled 2023-11-07: qty 1

## 2023-11-07 MED ORDER — MORPHINE SULFATE (PF) 2 MG/ML IV SOLN
2.0000 mg | Freq: Once | INTRAVENOUS | Status: AC
  Administered 2023-11-07: 2 mg via INTRAVENOUS
  Filled 2023-11-07: qty 1

## 2023-11-07 MED ORDER — METOPROLOL SUCCINATE ER 50 MG PO TB24
50.0000 mg | ORAL_TABLET | Freq: Every day | ORAL | Status: DC
  Administered 2023-11-07: 50 mg via ORAL
  Filled 2023-11-07: qty 1

## 2023-11-07 MED ORDER — TRAMADOL HCL 50 MG PO TABS
50.0000 mg | ORAL_TABLET | Freq: Four times a day (QID) | ORAL | 0 refills | Status: AC | PRN
  Filled 2023-11-07: qty 20, 5d supply, fill #0

## 2023-11-07 MED ORDER — TOPIRAMATE 25 MG PO TABS
25.0000 mg | ORAL_TABLET | Freq: Every day | ORAL | Status: DC
  Administered 2023-11-07: 25 mg via ORAL
  Filled 2023-11-07: qty 1

## 2023-11-07 MED ORDER — ENALAPRIL MALEATE 10 MG PO TABS
20.0000 mg | ORAL_TABLET | Freq: Every day | ORAL | Status: DC
  Administered 2023-11-07: 20 mg via ORAL
  Filled 2023-11-07: qty 2

## 2023-11-07 MED ORDER — ENSURE ENLIVE PO LIQD
237.0000 mL | Freq: Two times a day (BID) | ORAL | Status: DC
  Administered 2023-11-07: 237 mL via ORAL

## 2023-11-07 MED ORDER — GABAPENTIN 100 MG PO CAPS: 100.0000 mg | ORAL_CAPSULE | Freq: Every day | ORAL | Status: DC

## 2023-11-07 MED ORDER — SODIUM CHLORIDE 0.9 % IV BOLUS
1000.0000 mL | Freq: Once | INTRAVENOUS | Status: AC
  Administered 2023-11-07: 1000 mL via INTRAVENOUS

## 2023-11-07 MED ORDER — ACETAMINOPHEN 325 MG PO TABS
650.0000 mg | ORAL_TABLET | Freq: Four times a day (QID) | ORAL | Status: DC | PRN
  Administered 2023-11-07: 650 mg via ORAL
  Filled 2023-11-07: qty 2

## 2023-11-07 MED ORDER — ADULT MULTIVITAMIN W/MINERALS CH: 1.0000 | ORAL_TABLET | Freq: Every day | ORAL | Status: DC

## 2023-11-07 MED ORDER — SODIUM CHLORIDE 0.9 % IV SOLN
1.0000 g | Freq: Once | INTRAVENOUS | Status: AC
  Administered 2023-11-07: 1 g via INTRAVENOUS
  Filled 2023-11-07: qty 20

## 2023-11-07 MED ORDER — ONDANSETRON HCL 4 MG PO TABS: 4.0000 mg | ORAL_TABLET | Freq: Four times a day (QID) | ORAL | Status: DC | PRN

## 2023-11-07 MED ORDER — BUSPIRONE HCL 5 MG PO TABS
10.0000 mg | ORAL_TABLET | Freq: Two times a day (BID) | ORAL | Status: DC
  Administered 2023-11-07: 10 mg via ORAL
  Filled 2023-11-07 (×2): qty 2

## 2023-11-07 MED ORDER — ENOXAPARIN SODIUM 40 MG/0.4ML IJ SOSY
40.0000 mg | PREFILLED_SYRINGE | INTRAMUSCULAR | Status: DC
  Administered 2023-11-07: 40 mg via SUBCUTANEOUS
  Filled 2023-11-07: qty 0.4

## 2023-11-07 MED ORDER — NORTRIPTYLINE HCL 10 MG PO CAPS: 10.0000 mg | ORAL_CAPSULE | Freq: Every day | ORAL | Status: DC

## 2023-11-07 NOTE — TOC CM/SW Note (Signed)
 Patient admitted from Home PT eval pending Per SDOH food resources added to AVS

## 2023-11-07 NOTE — Discharge Instructions (Signed)
 Food Resources  Agency Name: [facility] Agency Address: [address], [place], [place] [address] Phone: [phone] Website: [email or web address] Service(s) Offered: Housing services, self-sufficiency, congregate meal program, weatherization program, Event organiser program, emergency food assistance,  housing counseling, home ownership program, wheels - to work program.  [employer] free for 60 and older at various locations from [employer], Monday-Friday:  [employer], [address] [place], [phone] -[facility], [address] [place] [phone]  -[facility], [address] [place] [phone]  -[address], [place], [phone]  Agency Name: [facility] Address: [phone] [address], [place], [place] [address] Phone: [phone] Website: [email or web address] Service(s) Offered: Home delivered hot, frozen, and emergency  meals. Grocery assistance program which matches  volunteers one-on-one with seniors unable to grocery shop  for themselves. Must be 60 years and older; less than 20  hours of in-home aide service, limited or no driving ability;  live alone or with someone with a disability; live in  [place].  Agency Name: [employer] [facility] [employer]) Address: [address], [place], [place] [address] Phone: [phone] Service(s) Offered: Food is served to shut-ins, homeless, elderly, and low income people in the community every Saturday (11:30 am-12:30 pm) and Sunday (12:30 pm-1:30pm). Volunteers also offer help and encouragement in seeking employment,  and spiritual guidance.  Agency Name: [employer] Address: [address] [place], [place] [address] Phone: [phone] Service(s) Offered: Child support services; child welfare services; food stamps; Medicaid; work first family assistance; and aid  with fuel,  rent, food and medicine.  Agency Name: [employer] Address: [address] [place], [place] Phone: [phone] Website: www.dreamalign.com Services Offered: Monday 10:00am-12:00, 8:00pm-9:00pm, and Friday 10:00am-12:00.  Agency Name: [employer] Address: [address] [place], [place] [address] Phone: [phone] Website: www.[email or web address] Service(s) Offered: Serves weekday meals, open from 11:30 am- 1:00 pm., and 6:30-7:30pm, Monday-Wednesday-Friday distributes food 3:30-6pm, Monday-Wednesday-Friday.  Agency Name: [facility] Address: [address] [place], [place] Phone: [phone] Website: [email or web address] Services Offered: Distributes food the 4th Saturday of the month, starting at 8:00 am  Agency Name: [facility] Address: [phone] [address], [place], [place] [address] Phone: [phone] Website: [email or web address] Service(s) Offered: [employer] life, weekly food pantry. Open Wednesdays from 10:00am-noon.  Agency Name: [facility] [employer] Address: [address] [place], [place], [place] Phone: [phone] Services Offered: Distributes food 9am-1pm, Monday-Thursday. Call for details.  Agency Name: [facility] Address: [address], [place], [place] [address] Phone: [phone] Website: [email or web address] Service(s) Offered: Games developer. Call for assistance.  Agency Name: [facility] Address: [address] [place], [place] [address] Phone: [phone] Service Offered: Emergency Food Pantry. Call for appointment.  Agency Name: [facility] Address: [address] [place], [place] [address] Phone: [phone] Website: [email or web address] Services Offered: Games developer. Call for details  Agency Name: [facility] Address: [address] [place], [place] Phone:  [phone] Website: [email or web address] Service(s) Offered: Emergency Food Pantry. Call for details.  Agency Name: [employer] Address: [address], [place], [place] [address] Phone: [phone] or [phone] Website: [email or web address] Service(s) Offered: Distribute food 9am-11:30 am, Tuesday-Friday, and 1-3:30pm, Monday-Friday. Food pantry Monday-Friday 1pm-3pm, fresh items, Mon.-Wed.-Fri.  Agency Name: [facility] [employer] [employer] Address: [address] [place], [place] [address] Phone: [phone] Website: [email or web address].org Services Offered: Distribute food Tues and Sats from 9:00am-noon.  Closed 1st Saturday of each month. Call for details  Agency Name: [employer] Address: [address] [facility] [address], [place] [address] Phone: [phone]  Services Offered: Delivers meals every Thursday

## 2023-11-07 NOTE — Plan of Care (Signed)
 GOC consult noted. Patient is currently in a semi private room at this time. Due to the nature of GOC conversations and the need for a quiet atmosphere, without distractions for extended periods of time, PMT will be happy to have full GOC discussion when patient is in a private room. Please re-consult PMT when patient is in a private room.

## 2023-11-07 NOTE — Plan of Care (Signed)
  Problem: Education: Goal: Ability to describe self-care measures that may prevent or decrease complications (Diabetes Survival Skills Education) will improve 11/07/2023 0738 by Judene Companion, RN Outcome: Progressing 11/07/2023 0737 by Judene Companion, RN Outcome: Progressing Goal: Individualized Educational Video(s) 11/07/2023 0738 by Judene Companion, RN Outcome: Progressing 11/07/2023 0737 by Judene Companion, RN Outcome: Progressing   Problem: Coping: Goal: Ability to adjust to condition or change in health will improve 11/07/2023 0738 by Judene Companion, RN Outcome: Progressing 11/07/2023 0737 by Judene Companion, RN Outcome: Progressing   Problem: Fluid Volume: Goal: Ability to maintain a balanced intake and output will improve 11/07/2023 0738 by Judene Companion, RN Outcome: Progressing 11/07/2023 0737 by Judene Companion, RN Outcome: Progressing

## 2023-11-07 NOTE — Assessment & Plan Note (Addendum)
 Generalized weakness Likely secondary to UTI in combination with hypercalcemia PT eval Fall precautions

## 2023-11-07 NOTE — Assessment & Plan Note (Signed)
 Sliding scale insulin coverage

## 2023-11-07 NOTE — Progress Notes (Signed)
 ED Pharmacy Antibiotic Sign Off An antibiotic consult was received from an ED provider for Meropenem per pharmacy dosing for UTI (h/o multiple abx allergies). A chart review was completed to assess appropriateness.   The following one time order(s) were placed: Meropenem 1 gm   Further antibiotic and/or antibiotic pharmacy consults should be ordered by the admitting provider if indicated.   Thank you for allowing pharmacy to be a part of this patient's care.   Thank you, Otelia Sergeant, PharmD, [facility] 11/07/2023 2:12 AM

## 2023-11-07 NOTE — Assessment & Plan Note (Addendum)
 Hypercalcemia Calcium elevated at 11.9 Followed by endocrinology-last appointment discussed long-term treatment Monitor calcium Received an NS bolus in the ED.  Continue to monitor

## 2023-11-07 NOTE — Progress Notes (Signed)
 PT Cancellation Note  Patient Details Name: Anne Steele MRN: [id number] DOB: 1940-11-24   Cancelled Treatment:    Reason Eval/Treat Not Completed: Other (comment) Chart reviewed, attempted to see pt.  She was laying in bed with family present.  Pt wanting to eat breakfast (thus far untouched.)  She and visitor both acted as if getting up today would be a stretch.  Will attempt to seek later as pt is willing and time allows.    Malachi Pro, DPT 11/07/2023, 9:25 AM

## 2023-11-07 NOTE — Progress Notes (Signed)
 Pharmacy Antibiotic Note  Anne Steele is a 83 y.o. female admitted on 11/07/2023 with UTI.  Pharmacy has been consulted for Aztreonam dosing d/t history of multiple abx allergies.  Plan: Aztreonam 1 gm q8hr per indication & renal fxn.  Pharmacy will continue to follow and will adjust abx dosing whenever warranted.  Temp (24hrs), Avg:98 F (36.7 C), Min:97.5 F (36.4 C), Max:98.4 F (36.9 C)   Recent Labs  Lab 11/06/23 2028  WBC 12.7*  CREATININE 1.19*    Estimated Creatinine Clearance: 30.2 mL/min (A) (by C-G formula based on SCr of 1.19 mg/dL (H)).    Allergies  Allergen Reactions   Ivp Dye [Iodinated Contrast Media] Anaphylaxis   Penicillins Shortness Of Breath    Patient states it "brings her asthma back on"   Amitriptyline Other (See Comments)    Heavy chest   Ciprofloxacin Other (See Comments)   Duloxetine Other (See Comments)    nightmares   Iodine Itching   Latanoprost     Blood shot eyes   Tetracyclines & Related Swelling   Trusopt [Dorzolamide Hcl]    Zocor [Simvastatin]     MUSCLE CRAMPS   Zovirax [Acyclovir]    Benzalkonium Chloride Rash   Betadine [Povidone Iodine] Rash   Macrobid [Nitrofurantoin] Rash   Sulfa Antibiotics Rash   Tape Rash    Antimicrobials this admission: 2/18 Meropenem >> x 1 dose 2/18 Aztreonam >>  Microbiology results: 2/17 UCx: Pending   Thank you for allowing pharmacy to be a part of this patient's care.  Otelia Sergeant, PharmD, MBA 11/07/2023 5:00 AM

## 2023-11-07 NOTE — H&P (Signed)
 History and Physical    Patient: Anne Steele ZOX:[id number] DOB: 11-07-1940 DOA: 11/07/2023 DOS: the patient was seen and examined on 11/07/2023 PCP: Barbette Reichmann, MD  Patient coming from: Home  Chief Complaint:  Chief Complaint  Patient presents with   Weakness   Fall    HPI: Anne Steele is a 83 y.o. female with medical history significant for Primary hyperparathyroidism, followed by endocrinology, bilateral nephrolithiasis, COPD, DM, fibromyalgia, CKD 3a, HTN,  who presents to the ED with generalized weakness leading to a fall in the bathroom on the day of arrival.  She complains of right-sided chest pain.  Patient has had decreased oral intake for the past few days and feels similar to when she has had a UTI in the past.  She has had no fever or chills, abdominal pain or vomiting or dysuria.  Denies cough or shortness of breath.  Daughter at bedside contributed to history. ED course and data review: BP elevated at 187/105 on arrival, improving to 162/75 without intervention.  Vitals otherwise unremarkable. Workup notable for WBC 12,700 and urinalysis with large leukocytes and rare bacteria Creatinine 1 9 which is near her baseline.  Troponin 21. EKG, personally viewed and interpreted showing NSR at 74 with no acute ST-T wave changes Chest x-ray clear Head CT nonacute Patient has multiple antibiotic allergies and was treated with meropenem in the ED following pharmacy consult.  She was given an NS bolus.  Hospitalist consulted for admission.     Past Medical History:  Diagnosis Date   Arthritis    Asthma    Bell palsy 1973   Bell's palsy    Benign bladder tumor    Depression    Fibromyalgia    GERD (gastroesophageal reflux disease)    Head ache    Hiatal hernia    History of kidney stones    Hypercalcemia    Hypertension    Kidney stones    Optic nerve disease    Shingles    Vitamin D deficiency    Past Surgical History:  Procedure Laterality  Date   ABDOMINAL HYSTERECTOMY  1982   APPENDECTOMY     ARTERY BIOPSY N/A 02/24/2020   Procedure: BIOPSY TEMPORAL ARTERY;  Surgeon: Sung Amabile, DO;  Location: [facility];  Service: General;  Laterality: N/A;   BLADDER TUMOR EXCISION  08/2018   BREAST BIOPSY Right 03/04/2016   US biopsy/ INTRADUCTAL PAPILLOMA   BREAST BIOPSY Left 03/04/2016   Fibroadenoma   BREAST EXCISIONAL BIOPSY Left 03/05/2019   Inflamed sebaceous cyst of the left breast, excised by dr. Lemar Livings in office BENIGN EPIDERMAL INCLUSION CYST WITH POSSIBLE RUPTURE AND ASSOCIATED INFLAMMATORY RESPONSE   CARPAL TUNNEL RELEASE Bilateral 2012   CATARACT EXTRACTION Bilateral 2017   CHOLECYSTECTOMY  2001   COLONOSCOPY     CYST EXCISION     FINGER   CYSTOSCOPY W/ RETROGRADES Bilateral 08/29/2018   Procedure: CYSTOSCOPY WITH RETROGRADE PYELOGRAM;  Surgeon: Vanna Scotland, MD;  Location: [facility];  Service: Urology;  Laterality: Bilateral;   CYSTOSCOPY WITH BIOPSY N/A 08/29/2018   Procedure: CYSTOSCOPY WITH Bladder BIOPSY;  Surgeon: Vanna Scotland, MD;  Location: [facility];  Service: Urology;  Laterality: N/A;   ESOPHAGOGASTRODUODENOSCOPY     EYE SURGERY     KNEE SURGERY Left 2012   REFRACTIVE SURGERY Bilateral    SIGMOIDOSCOPY     Social History:  reports that she has never smoked. She has never used smokeless tobacco. She reports that she does not drink alcohol  and does not use drugs.  Allergies  Allergen Reactions   Ivp Dye [Iodinated Contrast Media] Anaphylaxis   Penicillins Shortness Of Breath    Patient states it "brings her asthma back on"   Amitriptyline Other (See Comments)    Heavy chest   Ciprofloxacin Other (See Comments)   Duloxetine Other (See Comments)    nightmares   Iodine Itching   Latanoprost     Blood shot eyes   Tetracyclines & Related Swelling   Trusopt [Dorzolamide Hcl]    Zocor [Simvastatin]     MUSCLE CRAMPS   Zovirax [Acyclovir]    Benzalkonium Chloride Rash   Betadine [Povidone Iodine] Rash    Macrobid [Nitrofurantoin] Rash   Sulfa Antibiotics Rash   Tape Rash    Family History  Problem Relation Age of Onset   Leukemia Mother    Diabetes Father    Breast cancer Sister    Brain cancer Brother    Diabetes Sister    Diabetes Brother    Prostate cancer Neg Hx    Kidney cancer Neg Hx    Bladder Cancer Neg Hx     Prior to Admission medications   Medication Sig Start Date End Date Taking? Authorizing Provider  acetaminophen (TYLENOL) 500 MG tablet Take 1,000 mg by mouth 2 (two) times daily.    [provider]  allopurinol (ZYLOPRIM) 100 MG tablet Take 100 mg by mouth daily.    [provider]  busPIRone (BUSPAR) 10 MG tablet Take 10 mg by mouth 2 (two) times daily.    [provider]  cetirizine (ZYRTEC) 10 MG tablet Take 10 mg by mouth daily.    [provider]  ciprofloxacin (CIPRO) 250 MG tablet Take 1 tablet (250 mg total) by mouth 2 (two) times daily. 06/16/21   Michiel Cowboy A, PA-C  cyanocobalamin 1000 MCG tablet Take 100 mcg by mouth daily.    [provider]  ELIQUIS 5 MG TABS tablet Take 1 tablet by mouth 2 (two) times daily. 12/18/20   [provider]  enalapril (VASOTEC) 20 MG tablet Take 20 mg by mouth daily.    [provider]  fluticasone (FLONASE) 50 MCG/ACT nasal spray Place 2 sprays into both nostrils daily.    [provider]  gabapentin (NEURONTIN) 100 MG capsule Take by mouth. 06/30/20   [provider]  GEMTESA 75 MG TABS TAKE 1 TABLET BY MOUTH ONCE DAILY. 07/13/21   Michiel Cowboy A, PA-C  metoprolol succinate (TOPROL-XL) 25 MG 24 hr tablet Take 25 mg by mouth daily.    [provider]  nortriptyline (PAMELOR) 10 MG capsule Take 10 mg by mouth at bedtime. 11/17/20   [provider]  pantoprazole (PROTONIX) 40 MG tablet Take 40 mg by mouth daily. 05/30/20   [provider]  topiramate (TOPAMAX) 25 MG tablet Take 25 mg by mouth daily.    [provider]    Physical Exam: Vitals:   11/06/23 2035 11/06/23 2312  BP: (!) 187/105 (!) 162/75  Pulse: 79 67  Resp: 18 18  Temp: 97.8 F (36.6 C) 98.1 F (36.7 C)  TempSrc: Oral   SpO2: 98% 100%  Weight: 66.2 kg   Height: 4\' 11"  (1.499 m)    Physical Exam Vitals and nursing note reviewed.  Constitutional:      General: She is not in acute distress. HENT:     Head: Normocephalic and atraumatic.  Cardiovascular:     Rate and Rhythm: Normal rate  and regular rhythm.     Heart sounds: Normal heart sounds.  Pulmonary:     Effort: Pulmonary effort is normal.     Breath sounds: Normal breath sounds.  Abdominal:     Palpations: Abdomen is soft.     Tenderness: There is no abdominal tenderness.  Neurological:     Mental Status: Mental status is at baseline.     Labs on Admission: I have personally reviewed following labs and imaging studies  CBC: Recent Labs  Lab 11/06/23 2028  WBC 12.7*  HGB 13.0  HCT 39.1  MCV 94.4  PLT 271   Basic Metabolic Panel: Recent Labs  Lab 11/06/23 2028  NA 138  K 4.0  CL 101  CO2 25  GLUCOSE 116*  BUN 21  CREATININE 1.19*  CALCIUM 11.9*   GFR: Estimated Creatinine Clearance: 30.2 mL/min (A) (by C-G formula based on SCr of 1.19 mg/dL (H)). Liver Function Tests: No results for input(s): "AST", "ALT", "ALKPHOS", "BILITOT", "PROT", "ALBUMIN" in the last 168 hours. No results for input(s): "LIPASE", "AMYLASE" in the last 168 hours. No results for input(s): "AMMONIA" in the last 168 hours. Coagulation Profile: No results for input(s): "INR", "PROTIME" in the last 168 hours. Cardiac Enzymes: No results for input(s): "CKTOTAL", "CKMB", "CKMBINDEX", "TROPONINI" in the last 168 hours. BNP (last 3 results) No results for input(s): "PROBNP" in the last 8760 hours. HbA1C: No results for input(s): "HGBA1C" in the last 72 hours. CBG: No results for input(s): "GLUCAP" in the last 168 hours. Lipid Profile: No results for input(s):  "CHOL", "HDL", "LDLCALC", "TRIG", "CHOLHDL", "LDLDIRECT" in the last 72 hours. Thyroid Function Tests: No results for input(s): "TSH", "T4TOTAL", "FREET4", "T3FREE", "THYROIDAB" in the last 72 hours. Anemia Panel: No results for input(s): "VITAMINB12", "FOLATE", "FERRITIN", "TIBC", "IRON", "RETICCTPCT" in the last 72 hours. Urine analysis:    Component Value Date/Time   COLORURINE YELLOW (A) 11/06/2023 2028   APPEARANCEUR CLOUDY (A) 11/06/2023 2028   APPEARANCEUR Hazy (A) 06/09/2021 0909   LABSPEC 1.004 (L) 11/06/2023 2028   PHURINE 6.0 11/06/2023 2028   GLUCOSEU NEGATIVE 11/06/2023 2028   HGBUR SMALL (A) 11/06/2023 2028   BILIRUBINUR NEGATIVE 11/06/2023 2028   BILIRUBINUR Negative 06/09/2021 0909   KETONESUR NEGATIVE 11/06/2023 2028   PROTEINUR NEGATIVE 11/06/2023 2028   NITRITE NEGATIVE 11/06/2023 2028   LEUKOCYTESUR LARGE (A) 11/06/2023 2028    Radiological Exams on Admission: DG Chest 2 View Result Date: 11/06/2023 CLINICAL DATA:  Chest pain EXAM: CHEST - 2 VIEW COMPARISON:  None Available. FINDINGS: The heart size and mediastinal contours are within normal limits. Both lungs are clear. Degenerative changes of the spine IMPRESSION: No active cardiopulmonary disease. Electronically Signed   By: Jasmine Pang M.D.   On: 11/06/2023 21:57   CT Head Wo Contrast Result Date: 11/06/2023 CLINICAL DATA:  Acute neurologic deficit, weakness and fall EXAM: CT HEAD WITHOUT CONTRAST TECHNIQUE: Contiguous axial images were obtained from the base of the skull through the vertex without intravenous contrast. RADIATION DOSE REDUCTION: This exam was performed according to the departmental dose-optimization program which includes automated exposure control, adjustment of the mA and/or kV according to patient size and/or use of iterative reconstruction technique. COMPARISON:  None Available. FINDINGS: Brain: No mass,hemorrhage or extra-axial collection. Normal appearance of the parenchyma and CSF spaces.  Vascular: No hyperdense vessel or unexpected vascular calcification. Skull: The visualized skull base, calvarium and extracranial soft tissues are normal. Sinuses/Orbits: No fluid levels or advanced mucosal thickening of the visualized paranasal sinuses.  No mastoid or middle ear effusion. Normal orbits. Other: None. IMPRESSION: Normal head CT. Electronically Signed   By: Deatra Robinson M.D.   On: 11/06/2023 21:19     Data Reviewed: Relevant notes from primary care and specialist visits, past discharge summaries as available in EHR, including Care Everywhere. Prior diagnostic testing as pertinent to current admission diagnoses Updated medications and problem lists for reconciliation ED course, including vitals, labs, imaging, treatment and response to treatment Triage notes, nursing and pharmacy notes and ED provider's notes Notable results as noted in HPI   Assessment and Plan: * Pyuria Multiple antibiotic allergies Received meropenem in the ED due to multiple antibiotic allergies (penicillin, fluoroquinolones, tetracycline, Macrobid and sulfa) Will try aztreonam as patient is not septic Follow urine culture  Fall at home, initial encounter Generalized weakness Likely secondary to UTI in combination with hypercalcemia PT eval Fall precautions  Chest wall contusion, right, initial encounter Chest x-ray nonacute and with no evidence of rib fracture Pain control  Primary hyperparathyroidism (HCC) Hypercalcemia Calcium elevated at 11.9 Followed by endocrinology-last appointment discussed long-term treatment Monitor calcium Received an NS bolus in the ED.  Continue to monitor  Type 2 diabetes mellitus with diabetic chronic kidney disease (HCC) Sliding scale insulin coverage  Stage 3a chronic kidney disease (HCC) Renal function at baseline  Fibromyalgia Continue topiramate, nortriptyline and gabapentin    DVT prophylaxis: Lovenox  Consults: none  Advance Care Planning:  full code  Family Communication: daughter at bedside  Disposition Plan: Back to previous home environment  Severity of Illness: The appropriate patient status for this patient is OBSERVATION. Observation status is judged to be reasonable and necessary in order to provide the required intensity of service to ensure the patient's safety. The patient's presenting symptoms, physical exam findings, and initial radiographic and laboratory data in the context of their medical condition is felt to place them at decreased risk for further clinical deterioration. Furthermore, it is anticipated that the patient will be medically stable for discharge from the hospital within 2 midnights of admission.   Author: Andris Baumann, MD 11/07/2023 3:09 AM  For on call review [email or web address].

## 2023-11-07 NOTE — Plan of Care (Signed)
  Problem: Education: Goal: Ability to describe self-care measures that may prevent or decrease complications (Diabetes Survival Skills Education) will improve 11/07/2023 0741 by Judene Companion, RN Outcome: Progressing 11/07/2023 0738 by Judene Companion, RN Outcome: Progressing 11/07/2023 0737 by Judene Companion, RN Outcome: Progressing Goal: Individualized Educational Video(s) 11/07/2023 0741 by Judene Companion, RN Outcome: Progressing 11/07/2023 0738 by Judene Companion, RN Outcome: Progressing 11/07/2023 0737 by Judene Companion, RN Outcome: Progressing   Problem: Coping: Goal: Ability to adjust to condition or change in health will improve 11/07/2023 0741 by Judene Companion, RN Outcome: Progressing 11/07/2023 0738 by Judene Companion, RN Outcome: Progressing 11/07/2023 0737 by Judene Companion, RN Outcome: Progressing   Problem: Education: Goal: Individualized Educational Video(s) 11/07/2023 0741 by Judene Companion, RN Outcome: Progressing 11/07/2023 0738 by Judene Companion, RN Outcome: Progressing 11/07/2023 0737 by Judene Companion, RN Outcome: Progressing

## 2023-11-07 NOTE — Evaluation (Signed)
 Physical Therapy Evaluation Patient Details Name: Anne Steele MRN: [id number] DOB: 03/31/1941 Today's Date: 11/07/2023  History of Present Illness  83 y.o. female with medical history significant for Primary hyperparathyroidism, followed by endocrinology, bilateral nephrolithiasis, COPD, DM, fibromyalgia, CKD 3a, HTN,  who presents to the ED with generalized weakness leading to a fall in the bathroom on the day of arrival.  She complains of right-sided chest pain.  Clinical Impression  Pt showed some initial hesitancy but ultimately was able to perform all requested acts safely and with relative confidence.  She did use walker (baseline cane) for ambulation but did ~200 ft with consistent and safe cadence.  She had some R rib pain hesitancy with bed mobility transitions but ultimately did better than she indicated she thought she'd do and was pleasantly surprised by her lack of increased pain with the activity.  Pt is not at baseline and will benefit from continued PT to address fucntional limitations.        If plan is discharge home, recommend the following: Assist for transportation;Assistance with cooking/housework   Can travel by private vehicle        Equipment Recommendations None recommended by PT  Recommendations for Other Services       Functional Status Assessment Patient has had a recent decline in their functional status and demonstrates the ability to make significant improvements in function in a reasonable and predictable amount of time.     Precautions / Restrictions Precautions Precautions: Fall Restrictions Weight Bearing Restrictions Per Provider Order: No      Mobility  Bed Mobility Overal bed mobility: Needs Assistance Bed Mobility: Supine to Sit     Supine to sit: Contact guard     General bed mobility comments: Pt did need light HHA to pull up but did majority of the transition to sitting with rail and extra time w/o phyiscal assist     Transfers Overall transfer level: Modified independent Equipment used: Rolling walker (2 wheels)               General transfer comment: Pt was able to rise to standing with relative confidence, did not need assist, appropriate RW reliance    Ambulation/Gait Ambulation/Gait assistance: Contact guard assist Gait Distance (Feet): 200 Feet Assistive device: Rolling walker (2 wheels)         General Gait Details: Pt was able to assume smooth and consistent gait with RW after some initial hesitation.  Her O2 remained in the mid 90s on room air and she had no LOBs or overt safety issues. She did not wish to trial using hallway rail (simulaing single UE baseline cane use) but overall did well.  Stairs            Wheelchair Mobility     Tilt Bed    Modified Rankin (Stroke Patients Only)       Balance                                             Pertinent Vitals/Pain Pain Assessment Pain Assessment: 0-10 Pain Score: 8  Pain Location: R ribs, where she fell on the tub    Home Living Family/patient expects to be discharged to:: Private residence Living Arrangements: Alone Available Help at Discharge: Available PRN/intermittently;Family (family lives ~5 minutes away, check in QD) Type of Home: House Home Access:  (small threshold)  Home Layout: One level (6" step down to den, no rail) Home Equipment: Agricultural consultant (2 wheels);Rollator (4 wheels);Cane - single point      Prior Function Prior Level of Function : Independent/Modified Independent;Driving             Mobility Comments: Pt reports she manages to get out of the home multiple times per week, driving, etc - uses [facility] ADLs Comments: she does meals, light cleaning, family helps with heavier chores     Extremity/Trunk Assessment   Upper Extremity Assessment Upper Extremity Assessment: Overall WFL for tasks assessed;Generalized weakness (R RTC issues, minimal shoulder  elevation)    Lower Extremity Assessment Lower Extremity Assessment: Generalized weakness       Communication   Communication Communication: No apparent difficulties    Cognition Arousal: Alert Behavior During Therapy: WFL for tasks assessed/performed   PT - Cognitive impairments: No apparent impairments                         Following commands: Intact       Cueing Cueing Techniques: Verbal cues, Tactile cues     General Comments General comments (skin integrity, edema, etc.): Pt c/o R rib pain and hesitant to do a lot, but actually performed all tasks (bed mobility, transfers, prolonged gait) w/ only minimal hesitancy and ultimately did quite well.    Exercises     Assessment/Plan    PT Assessment Patient needs continued PT services  PT Problem List Decreased strength;Decreased activity tolerance;Decreased balance;Decreased range of motion;Decreased safety awareness;Decreased knowledge of use of DME;Pain       PT Treatment Interventions DME instruction;Gait training;Stair training;Functional mobility training;Therapeutic activities;Therapeutic exercise;Balance training;Patient/family education    PT Goals (Current goals can be found in the Care Plan section)  Acute Rehab PT Goals Patient Stated Goal: go home PT Goal Formulation: With patient Time For Goal Achievement: 11/20/23 Potential to Achieve Goals: Good    Frequency Min 1X/week     Co-evaluation               AM-PAC PT "6 Clicks" Mobility  Outcome Measure Help needed turning from your back to your side while in a flat bed without using bedrails?: None Help needed moving from lying on your back to sitting on the side of a flat bed without using bedrails?: A Little Help needed moving to and from a bed to a chair (including a wheelchair)?: None Help needed standing up from a chair using your arms (e.g., wheelchair or bedside chair)?: None Help needed to walk in hospital room?: A  Little Help needed climbing 3-5 steps with a railing? : A Little 6 Click Score: 21    End of Session Equipment Utilized During Treatment: Gait belt Activity Tolerance: Patient tolerated treatment well Patient left: with chair alarm set;with call bell/phone within reach;with family/visitor present Nurse Communication: Mobility status PT Visit Diagnosis: Muscle weakness (generalized) (M62.81);Difficulty in walking, not elsewhere classified (R26.2);Pain Pain - Right/Left: Right Pain - part of body:  (ribs)    Time: 1610-9604 PT Time Calculation (min) (ACUTE ONLY): 32 min   Charges:   PT Evaluation $PT Eval Low Complexity: 1 Low PT Treatments $Gait Training: 8-22 mins PT General Charges $$ ACUTE PT VISIT: 1 Visit         Malachi Pro, DPT 11/07/2023, 2:10 PM

## 2023-11-07 NOTE — TOC Transition Note (Addendum)
 Transition of Care [facility]) - Discharge Note   Patient Details  Name: Anne Steele MRN: [id number] Date of Birth: 07-Feb-1941  [facility] CM/SW Contact:  Allena Katz, LCSW Phone Number: 11/07/2023, 2:55 PM   Clinical Narrative:   CSW spoke with patient about HH. She does not have a preference. [facility] unable to take. [facility] unable to take. [facility] unable to check. Velna Hatchet with [facility] is checking to see. [facility] unable to take. Message left with cory with [facility]. [facility] unable to take. CSW spoke with carolyn with [facility] who states she likely can take the patient.   Final next level of care: Home w Home Health Services Barriers to Discharge: Barriers Resolved   Patient Goals and CMS Choice Patient states their goals for this hospitalization and ongoing recovery are:: home with [facility] CMS Medicare.gov Compare Post Acute Care list provided to:: Patient Choice offered to / list presented to : Patient      Discharge Placement                       Discharge Plan and Services Additional resources added to the After Visit Summary for                                       Social Drivers of Health (SDOH) Interventions SDOH Screenings   Food Insecurity: Food Insecurity Present (11/07/2023)  Housing: Low Risk  (11/07/2023)  Transportation Needs: No Transportation Needs (11/07/2023)  Utilities: Not At Risk (11/07/2023)  Financial Resource Strain: Low Risk  (07/11/2023)   Received from [facility]  Social Connections: Moderately Integrated (11/07/2023)  Tobacco Use: Low Risk  (11/06/2023)     Readmission Risk Interventions     No data to display

## 2023-11-07 NOTE — Assessment & Plan Note (Signed)
 Continue topiramate, nortriptyline and gabapentin

## 2023-11-07 NOTE — Hospital Course (Signed)
 Marland Kitchen

## 2023-11-07 NOTE — ED Notes (Signed)
 ED TO INPATIENT HANDOFF REPORT  ED Nurse Name and Phone #: [phone]  S Name/Age/Gender Anne Steele 83 y.o. female Room/Bed: [phone]  Code Status   Code Status: Full Code  Home/SNF/Other Home Patient oriented to: self, place, time, and situation Is this baseline? Yes   Triage Complete: Triage complete  Chief Complaint Pyuria [R82.81]  Triage Note Pt to ED via POV c/o weakness and fall. Pt was in bathroom when she felt weak and started drifting woards the right side, falling and hitting right side of ribcage on bathtub. Around 2:15pm. Pt reports feeling weak for the past couple days. Denies hitting head. Pt reports feeling weaker on right side. Pt has slight facial droop, equal grip strength bilaterally and no extremity drift. Denies any blurry vision, slurred speech. Pt discussed with Wells MD   Allergies Allergies  Allergen Reactions   Ivp Dye [Iodinated Contrast Media] Anaphylaxis   Penicillins Shortness Of Breath    Patient states it "brings her asthma back on"   Amitriptyline Other (See Comments)    Heavy chest   Ciprofloxacin Other (See Comments)   Duloxetine Other (See Comments)    nightmares   Iodine Itching   Latanoprost     Blood shot eyes   Tetracyclines & Related Swelling   Trusopt [Dorzolamide Hcl]    Zocor [Simvastatin]     MUSCLE CRAMPS   Zovirax [Acyclovir]    Benzalkonium Chloride Rash   Betadine [Povidone Iodine] Rash   Macrobid [Nitrofurantoin] Rash   Sulfa Antibiotics Rash   Tape Rash    Level of Care/Admitting Diagnosis ED Disposition     ED Disposition  Admit   Condition  --   Comment  Hospital Area: [facility] [[id number]]  Level of Care: Med-Surg [16]  Covid Evaluation: Asymptomatic - no recent exposure (last 10 days) testing not required  Diagnosis: Pyuria [301859]  Admitting Physician: Andris Baumann [[id number]]  Attending Physician: Andris Baumann [[id number]]          B Medical/Surgery  History Past Medical History:  Diagnosis Date   Arthritis    Asthma    Bell palsy 1973   Bell's palsy    Benign bladder tumor    Depression    Fibromyalgia    GERD (gastroesophageal reflux disease)    Head ache    Hiatal hernia    History of kidney stones    Hypercalcemia    Hypertension    Kidney stones    Optic nerve disease    Shingles    Vitamin D deficiency    Past Surgical History:  Procedure Laterality Date   ABDOMINAL HYSTERECTOMY  1982   APPENDECTOMY     ARTERY BIOPSY N/A 02/24/2020   Procedure: BIOPSY TEMPORAL ARTERY;  Surgeon: Sung Amabile, DO;  Location: [facility] ORS;  Service: General;  Laterality: N/A;   BLADDER TUMOR EXCISION  08/2018   BREAST BIOPSY Right 03/04/2016   US biopsy/ INTRADUCTAL PAPILLOMA   BREAST BIOPSY Left 03/04/2016   Fibroadenoma   BREAST EXCISIONAL BIOPSY Left 03/05/2019   Inflamed sebaceous cyst of the left breast, excised by dr. Lemar Livings in office BENIGN EPIDERMAL INCLUSION CYST WITH POSSIBLE RUPTURE AND ASSOCIATED INFLAMMATORY RESPONSE   CARPAL TUNNEL RELEASE Bilateral 2012   CATARACT EXTRACTION Bilateral 2017   CHOLECYSTECTOMY  2001   COLONOSCOPY     CYST EXCISION     FINGER   CYSTOSCOPY W/ RETROGRADES Bilateral 08/29/2018   Procedure: CYSTOSCOPY WITH RETROGRADE PYELOGRAM;  Surgeon: Vanna Scotland, MD;  Location: [facility] ORS;  Service: Urology;  Laterality: Bilateral;   CYSTOSCOPY WITH BIOPSY N/A 08/29/2018   Procedure: CYSTOSCOPY WITH Bladder BIOPSY;  Surgeon: Vanna Scotland, MD;  Location: [facility] OR[facility];  Service: Urology;  Laterality: N/A;   ESOPHAGOGASTRODUODENOSCOPY     EYE SURGERY     KNEE SURGERY Left 2012   REFRACTIVE SURGERY Bilateral    SIGMOIDOSCOPY       A IV Location/Drains/Wounds Patient Lines/Drains/Airways Status     Active Line/Drains/Airways     Name Placement date Placement time Site Days   Peripheral IV 11/07/23 20 G Left Antecubital 11/07/23  0205  Antecubital  less than 1            Intake/Output Last 24  hours No intake or output data in the 24 hours ending 11/07/23 0334  Labs/Imaging Results for orders placed or performed during the hospital encounter of 11/07/23 (from the past 48 hours)  Basic metabolic panel     Status: Abnormal   Collection Time: 11/06/23  8:28 PM  Result Value Ref Range   Sodium 138 135 - 145 mmol/L   Potassium 4.0 3.5 - 5.1 mmol/L   Chloride 101 98 - 111 mmol/L   CO2 25 22 - 32 mmol/L   Glucose, Bld 116 (H) 70 - 99 mg/dL    Comment: Glucose reference range applies only to samples taken after fasting for at least 8 hours.   BUN 21 8 - 23 mg/dL   Creatinine, Ser 1.61 (H) 0.44 - 1.00 mg/dL   Calcium 09.6 (H) 8.9 - 10.3 mg/dL   GFR, Estimated 46 (L) >60 mL/min    Comment: (NOTE) Calculated using the CKD-EPI Creatinine Equation (2021)    Anion gap 12 5 - 15    Comment: Performed at [facility], [address] [place], [place] [address]  CBC     Status: Abnormal   Collection Time: 11/06/23  8:28 PM  Result Value Ref Range   WBC 12.7 (H) 4.0 - 10.5 K/uL   RBC 4.14 3.87 - 5.11 MIL/uL   Hemoglobin 13.0 12.0 - 15.0 g/dL   HCT 98.1 19.1 - 47.8 %   MCV 94.4 80.0 - 100.0 fL   MCH 31.4 26.0 - 34.0 pg   MCHC 33.2 30.0 - 36.0 g/dL   RDW 29.5 62.1 - 30.8 %   Platelets 271 150 - 400 K/uL   nRBC 0.0 0.0 - 0.2 %    Comment: Performed at [facility], [address] [place], [place] [address]  Troponin I (High Sensitivity)     Status: Abnormal   Collection Time: 11/06/23  8:28 PM  Result Value Ref Range   Troponin I (High Sensitivity) 24 (H) <18 ng/L    Comment: (NOTE) Elevated high sensitivity troponin I (hsTnI) values and significant  changes across serial measurements may suggest ACS but many other  chronic and acute conditions are known to elevate hsTnI results.  Refer to the "Links" section for chest pain algorithms and additional  guidance. Performed at [facility], [address] [place], [place] [address]   Urinalysis,  Routine w reflex microscopic -Urine, Random     Status: Abnormal   Collection Time: 11/06/23  8:28 PM  Result Value Ref Range   Color, Urine YELLOW (A) YELLOW   APPearance CLOUDY (A) CLEAR   Specific Gravity, Urine 1.004 (L) 1.005 - 1.030   pH 6.0 5.0 - 8.0   Glucose, UA NEGATIVE NEGATIVE mg/dL   Hgb urine dipstick SMALL (  A) NEGATIVE   Bilirubin Urine NEGATIVE NEGATIVE   Ketones, ur NEGATIVE NEGATIVE mg/dL   Protein, ur NEGATIVE NEGATIVE mg/dL   Nitrite NEGATIVE NEGATIVE   Leukocytes,Ua LARGE (A) NEGATIVE   RBC / HPF 0-5 0 - 5 RBC/hpf   WBC, UA >50 0 - 5 WBC/hpf   Bacteria, UA RARE (A) NONE SEEN   Squamous Epithelial / HPF 0 0 - 5 /HPF   WBC Clumps PRESENT     Comment: Performed at [facility], [address] [place], [place] [address]  Troponin I (High Sensitivity)     Status: Abnormal   Collection Time: 11/06/23 11:09 PM  Result Value Ref Range   Troponin I (High Sensitivity) 21 (H) <18 ng/L    Comment: (NOTE) Elevated high sensitivity troponin I (hsTnI) values and significant  changes across serial measurements may suggest ACS but many other  chronic and acute conditions are known to elevate hsTnI results.  Refer to the "Links" section for chest pain algorithms and additional  guidance. Performed at [facility], [address] [place], [place] [address]    DG Chest 2 View Result Date: 11/06/2023 CLINICAL DATA:  Chest pain EXAM: CHEST - 2 VIEW COMPARISON:  None Available. FINDINGS: The heart size and mediastinal contours are within normal limits. Both lungs are clear. Degenerative changes of the spine IMPRESSION: No active cardiopulmonary disease. Electronically Signed   By: Jasmine Pang M.D.   On: 11/06/2023 21:57   CT Head Wo Contrast Result Date: 11/06/2023 CLINICAL DATA:  Acute neurologic deficit, weakness and fall EXAM: CT HEAD WITHOUT CONTRAST TECHNIQUE: Contiguous axial images were obtained from the base of the skull through the vertex without  intravenous contrast. RADIATION DOSE REDUCTION: This exam was performed according to the departmental dose-optimization program which includes automated exposure control, adjustment of the mA and/or kV according to patient size and/or use of iterative reconstruction technique. COMPARISON:  None Available. FINDINGS: Brain: No mass,hemorrhage or extra-axial collection. Normal appearance of the parenchyma and CSF spaces. Vascular: No hyperdense vessel or unexpected vascular calcification. Skull: The visualized skull base, calvarium and extracranial soft tissues are normal. Sinuses/Orbits: No fluid levels or advanced mucosal thickening of the visualized paranasal sinuses. No mastoid or middle ear effusion. Normal orbits. Other: None. IMPRESSION: Normal head CT. Electronically Signed   By: Deatra Robinson M.D.   On: 11/06/2023 21:19    Pending Labs Unresulted Labs (From admission, onward)     Start     Ordered   11/14/23 0500  Creatinine, serum  (enoxaparin (LOVENOX)    CrCl >/= 30 ml/min)  Weekly,   TIMED     Comments: while on enoxaparin therapy    11/07/23 0319   11/07/23 0316  Hemoglobin A1c  (Glycemic Control (SSI)  Q 4 Hours / Glycemic Control (SSI)  AC +/- HS)  Once,   R       Comments: To assess prior glycemic control    11/07/23 0319   11/07/23 0157  Urine Culture  Once,   URGENT       Question:  Indication  Answer:  Bacteriuria screening (OB/GYN or Uro)   11/07/23 0156            Vitals/Pain Today's Vitals   11/06/23 2035 11/06/23 2312 11/07/23 0220  BP: (!) 187/105 (!) 162/75   Pulse: 79 67   Resp: 18 18   Temp: 97.8 F (36.6 C) 98.1 F (36.7 C)   TempSrc: Oral    SpO2: 98% 100%  Weight: 66.2 kg    Height: 4\' 11"  (1.499 m)    PainSc: 10-Worst pain ever  3     Isolation Precautions No active isolations  Medications Medications  metoprolol succinate (TOPROL-XL) 24 hr tablet 25 mg (has no administration in time range)  enalapril (VASOTEC) tablet 20 mg (has no  administration in time range)  nortriptyline (PAMELOR) capsule 10 mg (has no administration in time range)  busPIRone (BUSPAR) tablet 10 mg (has no administration in time range)  gabapentin (NEURONTIN) capsule 100 mg (has no administration in time range)  topiramate (TOPAMAX) tablet 25 mg (has no administration in time range)  enoxaparin (LOVENOX) injection 40 mg (has no administration in time range)  acetaminophen (TYLENOL) tablet 650 mg (has no administration in time range)    Or  acetaminophen (TYLENOL) suppository 650 mg (has no administration in time range)  ondansetron (ZOFRAN) tablet 4 mg (has no administration in time range)    Or  ondansetron (ZOFRAN) injection 4 mg (has no administration in time range)  insulin aspart (novoLOG) injection 0-15 Units (has no administration in time range)  insulin aspart (novoLOG) injection 0-5 Units (has no administration in time range)  morphine (PF) 2 MG/ML injection 2 mg (2 mg Intravenous Given 11/07/23 0205)  ondansetron (ZOFRAN) injection 4 mg (4 mg Intravenous Given 11/07/23 0205)  sodium chloride 0.9 % bolus 1,000 mL (1,000 mLs Intravenous Bolus from Bag 11/07/23 0207)  meropenem (MERREM) 1 g in sodium chloride 0.9 % 100 mL IVPB (1 g Intravenous New Bag/Given 11/07/23 0249)    Mobility walks with device     Focused Assessments     R Recommendations: See Admitting Provider Note  Report given to:   Additional Notes:

## 2023-11-07 NOTE — Assessment & Plan Note (Signed)
 -  Renal function  at baseline

## 2023-11-07 NOTE — Plan of Care (Signed)

## 2023-11-07 NOTE — ED Provider Notes (Signed)
 [facility] Provider Note    Event Date/Time   First MD Initiated Contact with Patient 11/07/23 0128     (approximate)   History   Weakness and Fall   HPI  Anne Steele is a 83 y.o. female referred to the ED from PCP for a several day history of generalized weakness, decreased oral intake.  Larey Seat today secondary to weakness.  Struck her right ribs around 2:15 PM.  Denies striking head or LOC.  Denies slurred speech, extremity weakness, vision changes, headache.  Denies fever/chills, chest pain, shortness of breath, abdominal pain, nausea or vomiting.  Endorses dysuria.     Past Medical History   Past Medical History:  Diagnosis Date   Arthritis    Asthma    Bell palsy 1973   Bell's palsy    Benign bladder tumor    Depression    Fibromyalgia    GERD (gastroesophageal reflux disease)    Head ache    Hiatal hernia    History of kidney stones    Hypercalcemia    Hypertension    Kidney stones    Optic nerve disease    Shingles    Vitamin D deficiency      Active Problem List   Patient Active Problem List   Diagnosis Date Noted   Stage 3a chronic kidney disease (HCC) 12/10/2019   Sebaceous cyst of skin of left breast 03/06/2019   Atrophic vaginitis 10/17/2018   Lumbar radiculopathy 05/01/2018   Major depressive disorder, recurrent, mild (HCC) 05/01/2018   DDD (degenerative disc disease), lumbar 06/09/2017   Obesity (BMI 30.0-34.9) 06/09/2017   Chronic kidney disease 10/11/2016   Depression 10/11/2016   Fibromyalgia 10/11/2016   History of kidney stones 10/11/2016   Pure hypercholesterolemia 10/11/2016   Leg cramps 04/06/2016   Papilloma of breast 03/15/2016   Fibroadenoma of left breast 03/15/2016   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 01/31/2016   Headache disorder 01/31/2016   Hypercalcemia 12/29/2015   GERD (gastroesophageal reflux disease) 06/24/2015   HTN (hypertension), benign 05/01/2014   Sprain of shoulder joint  02/27/2014     Past Surgical History   Past Surgical History:  Procedure Laterality Date   ABDOMINAL HYSTERECTOMY  1982   APPENDECTOMY     ARTERY BIOPSY N/A 02/24/2020   Procedure: BIOPSY TEMPORAL ARTERY;  Surgeon: Albert Devaul Amabile, DO;  Location: [facility] ORS;  Service: General;  Laterality: N/A;   BLADDER TUMOR EXCISION  08/2018   BREAST BIOPSY Right 03/04/2016   US biopsy/ INTRADUCTAL PAPILLOMA   BREAST BIOPSY Left 03/04/2016   Fibroadenoma   BREAST EXCISIONAL BIOPSY Left 03/05/2019   Inflamed sebaceous cyst of the left breast, excised by dr. Lemar Livings in office BENIGN EPIDERMAL INCLUSION CYST WITH POSSIBLE RUPTURE AND ASSOCIATED INFLAMMATORY RESPONSE   CARPAL TUNNEL RELEASE Bilateral 2012   CATARACT EXTRACTION Bilateral 2017   CHOLECYSTECTOMY  2001   COLONOSCOPY     CYST EXCISION     FINGER   CYSTOSCOPY W/ RETROGRADES Bilateral 08/29/2018   Procedure: CYSTOSCOPY WITH RETROGRADE PYELOGRAM;  Surgeon: Vanna Scotland, MD;  Location: [facility] OR[facility];  Service: Urology;  Laterality: Bilateral;   CYSTOSCOPY WITH BIOPSY N/A 08/29/2018   Procedure: CYSTOSCOPY WITH Bladder BIOPSY;  Surgeon: Vanna Scotland, MD;  Location: [facility] OR[facility];  Service: Urology;  Laterality: N/A;   ESOPHAGOGASTRODUODENOSCOPY     EYE SURGERY     KNEE SURGERY Left 2012   REFRACTIVE SURGERY Bilateral    SIGMOIDOSCOPY  Home Medications   Prior to Admission medications   Medication Sig Start Date End Date Taking? Authorizing Provider  acetaminophen (TYLENOL) 500 MG tablet Take 1,000 mg by mouth 2 (two) times daily.    [provider]  allopurinol (ZYLOPRIM) 100 MG tablet Take 100 mg by mouth daily.    [provider]  busPIRone (BUSPAR) 10 MG tablet Take 10 mg by mouth 2 (two) times daily.    [provider]  cetirizine (ZYRTEC) 10 MG tablet Take 10 mg by mouth daily.    [provider]  ciprofloxacin (CIPRO) 250 MG tablet Take 1 tablet (250 mg total) by mouth 2 (two) times daily. 06/16/21    Michiel Cowboy A, PA-C  cyanocobalamin 1000 MCG tablet Take 100 mcg by mouth daily.    [provider]  ELIQUIS 5 MG TABS tablet Take 1 tablet by mouth 2 (two) times daily. 12/18/20   [provider]  enalapril (VASOTEC) 20 MG tablet Take 20 mg by mouth daily.    [provider]  fluticasone (FLONASE) 50 MCG/ACT nasal spray Place 2 sprays into both nostrils daily.    [provider]  gabapentin (NEURONTIN) 100 MG capsule Take by mouth. 06/30/20   [provider]  GEMTESA 75 MG TABS TAKE 1 TABLET BY MOUTH ONCE DAILY. 07/13/21   Michiel Cowboy A, PA-C  metoprolol succinate (TOPROL-XL) 25 MG 24 hr tablet Take 25 mg by mouth daily.    [provider]  nortriptyline (PAMELOR) 10 MG capsule Take 10 mg by mouth at bedtime. 11/17/20   [provider]  pantoprazole (PROTONIX) 40 MG tablet Take 40 mg by mouth daily. 05/30/20   [provider]  topiramate (TOPAMAX) 25 MG tablet Take 25 mg by mouth daily.    [provider]     Allergies  Ivp dye [iodinated contrast media], Penicillins, Amitriptyline, Ciprofloxacin, Duloxetine, Iodine, Latanoprost, Tetracyclines & related, Trusopt [dorzolamide hcl], Zocor [simvastatin], Zovirax [acyclovir], Benzalkonium chloride, Betadine [povidone iodine], Macrobid [nitrofurantoin], Sulfa antibiotics, and Tape   Family History   Family History  Problem Relation Age of Onset   Leukemia Mother    Diabetes Father    Breast cancer Sister    Brain cancer Brother    Diabetes Sister    Diabetes Brother    Prostate cancer Neg Hx    Kidney cancer Neg Hx    Bladder Cancer Neg Hx      Physical Exam  Triage Vital Signs: ED Triage Vitals [11/06/23 2035]  Encounter Vitals Group     BP (!) 187/105     Systolic BP Percentile      Diastolic BP Percentile      Pulse Rate 79     Resp 18     Temp 97.8 F (36.6 C)     Temp Source Oral     SpO2 98 %     Weight 146 lb (66.2 kg)     Height  4\' 11"  (1.499 m)     Head Circumference      Peak Flow      Pain Score 10     Pain Loc      Pain Education      Exclude from Growth Chart     Updated Vital Signs: BP (!) 162/75 (BP Location: Left Arm)   Pulse 67   Temp 98.1 F (36.7 C)   Resp 18   Ht 4\' 11"  (1.499 m)   Wt 66.2 kg   SpO2 100%  BMI 29.49 kg/m    General: Awake, mild distress.  Mildly dry mucous membranes. CV:  RRR.  Good peripheral perfusion.  Resp:  Normal effort.  Diminished, otherwise CTAB.  Splinting.  Point tenderness right lateral ribs.  No crepitus. Abd:  Nontender.  No distention.  Other:  Head is atraumatic.  PERRL.  EOMI.  Nose is atraumatic.  No dental malocclusion.  No midline cervical spine tenderness to palpation, step-offs or deformities noted.  No truncal vesicles.  Alert and oriented to person and place.  CN II-XII grossly intact.  5/5 motor strength and sensation all extremities.   ED Results / Procedures / Treatments  Labs (all labs ordered are listed, but only abnormal results are displayed) Labs Reviewed  BASIC METABOLIC PANEL - Abnormal; Notable for the following components:      Result Value   Glucose, Bld 116 (*)    Creatinine, Ser 1.19 (*)    Calcium 11.9 (*)    GFR, Estimated 46 (*)    All other components within normal limits  CBC - Abnormal; Notable for the following components:   WBC 12.7 (*)    All other components within normal limits  URINALYSIS, ROUTINE W REFLEX MICROSCOPIC - Abnormal; Notable for the following components:   Color, Urine YELLOW (*)    APPearance CLOUDY (*)    Specific Gravity, Urine 1.004 (*)    Hgb urine dipstick SMALL (*)    Leukocytes,Ua LARGE (*)    Bacteria, UA RARE (*)    All other components within normal limits  TROPONIN I (HIGH SENSITIVITY) - Abnormal; Notable for the following components:   Troponin I (High Sensitivity) 24 (*)    All other components within normal limits  TROPONIN I (HIGH SENSITIVITY) - Abnormal; Notable for the following  components:   Troponin I (High Sensitivity) 21 (*)    All other components within normal limits     EKG  ED ECG REPORT I, Betsey Sossamon J, the attending physician, personally viewed and interpreted this ECG.   Date: 11/07/2023  EKG Time: 2043  Rate: 74  Rhythm: normal sinus rhythm  Axis: Normal  Intervals:none  ST&T Change: Nonspecific    RADIOLOGY I have independently visualized and interpreted patient's imaging studies as well as noted the radiology interpretation:  CT head: No ICH  Chest x-ray: No acute cardiopulmonary process  Official radiology report(s): DG Chest 2 View Result Date: 11/06/2023 CLINICAL DATA:  Chest pain EXAM: CHEST - 2 VIEW COMPARISON:  None Available. FINDINGS: The heart size and mediastinal contours are within normal limits. Both lungs are clear. Degenerative changes of the spine IMPRESSION: No active cardiopulmonary disease. Electronically Signed   By: Jasmine Pang M.D.   On: 11/06/2023 21:57   CT Head Wo Contrast Result Date: 11/06/2023 CLINICAL DATA:  Acute neurologic deficit, weakness and fall EXAM: CT HEAD WITHOUT CONTRAST TECHNIQUE: Contiguous axial images were obtained from the base of the skull through the vertex without intravenous contrast. RADIATION DOSE REDUCTION: This exam was performed according to the departmental dose-optimization program which includes automated exposure control, adjustment of the mA and/or kV according to patient size and/or use of iterative reconstruction technique. COMPARISON:  None Available. FINDINGS: Brain: No mass,hemorrhage or extra-axial collection. Normal appearance of the parenchyma and CSF spaces. Vascular: No hyperdense vessel or unexpected vascular calcification. Skull: The visualized skull base, calvarium and extracranial soft tissues are normal. Sinuses/Orbits: No fluid levels or advanced mucosal thickening of the visualized paranasal sinuses. No mastoid or middle ear effusion. Normal  orbits. Other: None.  IMPRESSION: Normal head CT. Electronically Signed   By: Deatra Robinson M.D.   On: 11/06/2023 21:19     PROCEDURES:  Critical Care performed: No  .1-3 Lead EKG Interpretation  Performed by: Irean Hong, MD Authorized by: Irean Hong, MD     Interpretation: normal     ECG rate:  70   ECG rate assessment: normal     Rhythm: sinus rhythm     Ectopy: none     Conduction: normal   Comments:     Patient placed on cardiac monitor to evaluate for arrhythmias    MEDICATIONS ORDERED IN ED: Medications  morphine (PF) 2 MG/ML injection 2 mg (has no administration in time range)  ondansetron (ZOFRAN) injection 4 mg (has no administration in time range)  sodium chloride 0.9 % bolus 1,000 mL (has no administration in time range)     IMPRESSION / MDM / ASSESSMENT AND PLAN / ED COURSE  I reviewed the triage vital signs and the nursing notes.                             83 year old female presenting with generalized weakness, fall resulting in right rib pain, decreased oral intake.  Differential diagnosis includes but is not limited to CVA, ACS, infectious, metabolic etiologies, etc.  I personally reviewed patient's records and note her urgent care visit from yesterday.  Patient's presentation is most consistent with acute complicated illness / injury requiring diagnostic workup.  The patient is on the cardiac monitor to evaluate for evidence of arrhythmia and/or significant heart rate changes.  Laboratory results demonstrate mild leukocytosis with WBC 12.7, AKI with creatinine 1.19, minimally elevated troponin which is likely secondary to demand ischemia.  UA demonstrates large leukocytes with greater than 50 WBCs.  CT head and chest x-ray negative.  Will administer IV antibiotics, fluids, morphine for rib contusion pain.  Patient does not meet sepsis criteria by vital signs.  Will consult hospitalist services for evaluation and admission.  Appreciate pharmacy consult for IV antibiotic  recommendations as patient has 16 documented allergies/contraindications.      FINAL CLINICAL IMPRESSION(S) / ED DIAGNOSES   Final diagnoses:  Generalized weakness  Lower urinary tract infectious disease  Fall, initial encounter  Elevated troponin  AKI (acute kidney injury) (HCC)     Rx / DC Orders   ED Discharge Orders     None        Note:  This document was prepared using Dragon voice recognition software and may include unintentional dictation errors.   Irean Hong, MD 11/07/23 [phone]

## 2023-11-07 NOTE — Assessment & Plan Note (Signed)
 Multiple antibiotic allergies Received meropenem in the ED due to multiple antibiotic allergies (penicillin, fluoroquinolones, tetracycline, Macrobid and sulfa) Will try aztreonam as patient is not septic Follow urine culture

## 2023-11-07 NOTE — Assessment & Plan Note (Signed)
 Chest x-ray nonacute and with no evidence of rib fracture Pain control

## 2023-11-07 NOTE — Discharge Summary (Signed)
 Physician Discharge Summary   Patient: Anne Steele MRN: [id number] DOB: [birth year]  Admit date:     11/07/2023  Discharge date: 11/07/23  Discharge Physician: Delfino Lovett   PCP: Barbette Reichmann, MD   Recommendations at discharge:    F/up with outpt providers as requested  Discharge Diagnoses: Principal Problem:   Pyuria Active Problems:   Fall at home, initial encounter   Generalized weakness   Chest wall contusion, right, initial encounter   Primary hyperparathyroidism (HCC)   Fibromyalgia   Stage 3a chronic kidney disease (HCC)   Type 2 diabetes mellitus with diabetic chronic kidney disease (HCC)   AKI (acute kidney injury) [facility])  Hospital Course: Assessment and Plan:  83 y.o. female with medical history significant for Primary hyperparathyroidism, followed by endocrinology, bilateral nephrolithiasis, COPD, DM, fibromyalgia, CKD 3a, HTN,  who presents to the ED with generalized weakness leading to a fall   * Pyuria Multiple antibiotic allergies Received meropenem in the ED due to multiple antibiotic allergies (penicillin, fluoroquinolones, tetracycline, Macrobid and sulfa) given aztreonam early on and given one dose of fosfomycin before DC neg urine culture. DCed on PO cipro as she doesn't recall any issues with it in the past (was listed as last Abx on her home meds)   Fall at home, initial encounter Generalized weakness Likely secondary to UTI in combination with hypercalcemia PT recommended HHPT which has been set up TOC at DC   Chest wall contusion, right, initial encounter Chest x-ray nonacute and with no evidence of rib fracture Pain control as need   Primary hyperparathyroidism (HCC) Hypercalcemia Calcium elevated at 11.9 Followed by endocrinology-last appointment discussed long-term treatment   Type 2 diabetes mellitus with diabetic chronic kidney disease (HCC) Stage 3a chronic kidney disease (HCC) Fibromyalgia Continue home meds        Disposition: Home health Diet recommendation:  Discharge Diet Orders (From admission, onward)     Start     Ordered   11/07/23 0000  Diet - low sodium heart healthy        11/07/23 1427           Carb modified diet DISCHARGE MEDICATION: Allergies as of 11/07/2023       Reactions   Ivp Dye [iodinated Contrast Media] Anaphylaxis   Penicillins Shortness Of Breath   Patient states it "brings her asthma back on"   Amitriptyline Other (See Comments)   Heavy chest   Ciprofloxacin Other (See Comments)   Duloxetine Other (See Comments)   nightmares   Iodine Itching   Latanoprost    Blood shot eyes   Tetracyclines & Related Swelling   Trusopt [dorzolamide Hcl]    Zocor [simvastatin]    MUSCLE CRAMPS   Zovirax [acyclovir]    Benzalkonium Chloride Rash   Betadine [povidone Iodine] Rash   Macrobid [nitrofurantoin] Rash   Sulfa Antibiotics Rash   Tape Rash        Medication List     STOP taking these medications    alendronate-cholecalciferol 70-2800 MG-UNIT tablet Commonly known as: FOSAMAX PLUS D   cetirizine 10 MG tablet Commonly known as: ZYRTEC   estradiol 0.1 MG/GM vaginal cream Commonly known as: ESTRACE   Gemtesa 75 MG Tabs Generic drug: Vibegron   hydrochlorothiazide 25 MG tablet Commonly known as: HYDRODIURIL   ketorolac 0.5 % ophthalmic solution Commonly known as: ACULAR   Latanoprost 0.005 % Emul   loratadine 10 MG tablet Commonly known as: CLARITIN   Magnesium Oxide -Mg Supplement 400 MG Caps  MENATETRENONE PO   omeprazole 20 MG capsule Commonly known as: PRILOSEC   Vitamin E 400 units Tabs       TAKE these medications    acetaminophen 500 MG tablet Commonly known as: TYLENOL Take 1,000 mg by mouth 2 (two) times daily.   allopurinol 100 MG tablet Commonly known as: ZYLOPRIM Take 100 mg by mouth daily.   busPIRone 10 MG tablet Commonly known as: BUSPAR Take 10 mg by mouth 2 (two) times daily.   ciprofloxacin 250 MG  tablet Commonly known as: Cipro Take 1 tablet (250 mg total) by mouth 2 (two) times daily for 7 days.   cyanocobalamin 1000 MCG tablet Take 100 mcg by mouth daily.   Eliquis 5 MG Tabs tablet Generic drug: apixaban Take 1 tablet by mouth 2 (two) times daily.   enalapril 20 MG tablet Commonly known as: VASOTEC Take 20 mg by mouth daily.   fluticasone 50 MCG/ACT nasal spray Commonly known as: FLONASE Place 2 sprays into both nostrils daily.   gabapentin 100 MG capsule Commonly known as: NEURONTIN Take by mouth. What changed: Another medication with the same name was removed. Continue taking this medication, and follow the directions you see here.   lidocaine 5 % Commonly known as: Lidoderm Place 1 patch onto the skin every 12 (twelve) hours for 10 days. Remove & Discard patch within 12 hours or as directed by MD   metoprolol succinate 25 MG 24 hr tablet Commonly known as: TOPROL-XL Take 25 mg by mouth daily. What changed: Another medication with the same name was removed. Continue taking this medication, and follow the directions you see here.   nortriptyline 10 MG capsule Commonly known as: PAMELOR Take 10 mg by mouth at bedtime.   oxybutynin 5 MG tablet Commonly known as: DITROPAN Take 5 mg by mouth once. Once daily   pantoprazole 40 MG tablet Commonly known as: PROTONIX Take 40 mg by mouth daily.   rOPINIRole 0.5 MG tablet Commonly known as: REQUIP Take 0.5 mg by mouth at bedtime.   topiramate 25 MG tablet Commonly known as: TOPAMAX Take 25 mg by mouth daily.   traMADol 50 MG tablet Commonly known as: Ultram Take 1 tablet (50 mg total) by mouth every 6 (six) hours as needed for up to 5 days for moderate pain (pain score 4-6). What changed:  when to take this reasons to take this        Follow-up Information     Barbette Reichmann, MD. Go on 11/13/2023.   Specialty: Internal Medicine Why: [facility] Discharge F/UP. Go at 1:15pm. Contact  information: [address] [place] [address] [phone]         Harle Battiest, PA-C. Go on 11/21/2023.   Specialty: Urology Why: [facility] Discharge F/UP. Go at 9:20am. Contact information: [address] [place] [address] [phone]         Patterson Hammersmith, MD. Schedule an appointment as soon as possible for a visit in 3 week(s).   Specialty: Rheumatology Why: [facility] Discharge F/UP Contact information: [address] [place] [address] [phone]         Raj Janus, MD. Schedule an appointment as soon as possible for a visit in 4 week(s).   Specialty: Endocrinology Why: [facility] Discharge F/UP Contact information: [address] [facility] [place] [address] [phone]  Discharge Exam: Filed Weights   11/06/23 2035  Weight: 66.2 kg   General: She is not in acute distress. HENT:     Head: Normocephalic and atraumatic.  Cardiovascular:     Rate and Rhythm: Normal rate and regular rhythm.     Heart sounds: Normal heart sounds.  Pulmonary:     Effort: Pulmonary effort is normal.     Breath sounds: Normal breath sounds.  Abdominal:     Palpations: Abdomen is soft.     Tenderness: There is no abdominal tenderness.  Neurological:     Mental Status: Mental status is at baseline.   Condition at discharge: fair  The results of significant diagnostics from this hospitalization (including imaging, microbiology, ancillary and laboratory) are listed below for reference.   Imaging Studies: MR BRAIN WO CONTRAST Result Date: 11/07/2023 CLINICAL DATA:  83 year old female with altered mental status. Weakness, fall. EXAM: MRI HEAD WITHOUT CONTRAST TECHNIQUE: Multiplanar, multiecho pulse sequences of the brain and surrounding structures were obtained without intravenous contrast. COMPARISON:  Head CT yesterday.  Previous brain MRI  02/21/2020. FINDINGS: Brain: No restricted diffusion to suggest acute infarction. No midline shift, mass effect, evidence of mass lesion, ventriculomegaly, extra-axial collection or acute intracranial hemorrhage. Negative pituitary. Cervicomedullary junction and cervical spine degeneration detailed below. Stable gray and white matter signal since the 2021 MRI, largely normal for age with minimal nonspecific white matter T2 and FLAIR hyperintensity. No cortical encephalomalacia or chronic cerebral blood products identified. Vascular: Major intracranial vascular flow voids are stable since 2021. Distal right vertebral artery appears dominant as before. Skull and upper cervical spine: Chronic but progressed cervical spine degeneration since the 2021 MRI. Bulky and progressed degenerative appearing ligamentous hypertrophy about the odontoid now, resulting in mild stenosis at the cervicomedullary junction (series 7, image 12). Degenerative appearing anterolisthesis of C3 on C4 with disc degeneration and chronic spinal stenosis there, with evidence of spinal cord mass effect on series 7, image 11. Background bone marrow signal is stable and within normal limits. Sinuses/Orbits: Chronic postoperative changes to both globes. Paranasal Visualized paranasal sinuses and mastoids are stable and well aerated. Other: Grossly normal visible internal auditory structures. Negative visible scalp and face. IMPRESSION: 1. No acute intracranial abnormality. Normal for age noncontrast MRI appearance of the brain. 2. 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 Query cervical myelopathy in the setting of weakness. Electronically Signed   By: Odessa Fleming M.D.   On: 11/07/2023 07:53   DG Chest 2 View Result Date: 11/06/2023 CLINICAL DATA:  Chest pain EXAM: CHEST - 2 VIEW COMPARISON:  None Available. FINDINGS: The heart size  and mediastinal contours are within normal limits. Both lungs are clear. Degenerative changes of the spine IMPRESSION: No active cardiopulmonary disease. Electronically Signed   By: Jasmine Pang M.D.   On: 11/06/2023 21:57   CT Head Wo Contrast Result Date: 11/06/2023 CLINICAL DATA:  Acute neurologic deficit, weakness and fall EXAM: CT HEAD WITHOUT CONTRAST TECHNIQUE: Contiguous axial images were obtained from the base of the skull through the vertex without intravenous contrast. RADIATION DOSE REDUCTION: This exam was performed according to the departmental dose-optimization program which includes automated exposure control, adjustment of the mA and/or kV according to patient size and/or use of iterative reconstruction technique. COMPARISON:  None Available. FINDINGS: Brain: No mass,hemorrhage or extra-axial collection. Normal appearance of the parenchyma and CSF spaces. Vascular: No hyperdense vessel or unexpected vascular calcification.  Skull: The visualized skull base, calvarium and extracranial soft tissues are normal. Sinuses/Orbits: No fluid levels or advanced mucosal thickening of the visualized paranasal sinuses. No mastoid or middle ear effusion. Normal orbits. Other: None. IMPRESSION: Normal head CT. Electronically Signed   By: Deatra Robinson M.D.   On: 11/06/2023 21:19    Microbiology: Results for orders placed or performed in visit on 06/09/21  CULTURE, URINE COMPREHENSIVE     Status: Abnormal   Collection Time: 06/09/21  9:09 AM   Specimen: Urine   UR  Result Value Ref Range Status   Urine Culture, Comprehensive Final report (A)  Final   Organism ID, Bacteria Escherichia coli (A)  Final    Comment: Cefazolin <=4 ug/mL Cefazolin with an MIC <=16 predicts susceptibility to the oral agents cefaclor, cefdinir, cefpodoxime, cefprozil, cefuroxime, cephalexin, and loracarbef when used for therapy of uncomplicated urinary tract infections due to E. coli, Klebsiella pneumoniae, and  Proteus mirabilis. Greater than 100,000 colony forming units per mL    Organism ID, Bacteria Comment  Final    Comment: Mixed urogenital flora 2,000 Colonies/mL    ANTIMICROBIAL SUSCEPTIBILITY Comment  Final    Comment:       ** S = Susceptible; I = Intermediate; R = Resistant **                    P = Positive; N = Negative             MICS are expressed in micrograms per mL    Antibiotic                 RSLT#1    RSLT#2    RSLT#3    RSLT#4 Amoxicillin/Clavulanic Acid    S Ampicillin                     S Cefepime                       S Ceftriaxone                    S Cefuroxime                     S Ciprofloxacin                  S Ertapenem                      S Gentamicin                     S Imipenem                       S Levofloxacin                   S Meropenem                      S Nitrofurantoin                 S Piperacillin/Tazobactam        S Tetracycline                   S Tobramycin                     S Trimethoprim/Sulfa  S   Microscopic Examination     Status: Abnormal   Collection Time: 06/09/21  9:09 AM   Urine  Result Value Ref Range Status   WBC, UA 6-10 (A) 0 - 5 /hpf Final   RBC, Urine 0-2 0 - 2 /hpf Final   Epithelial Cells (non renal) 0-10 0 - 10 /hpf Final   Bacteria, UA Moderate (A) None seen/Few Final    Labs: CBC: Recent Labs  Lab 11/06/23 2028  WBC 12.7*  HGB 13.0  HCT 39.1  MCV 94.4  PLT 271   Basic Metabolic Panel: Recent Labs  Lab 11/06/23 2028  NA 138  K 4.0  CL 101  CO2 25  GLUCOSE 116*  BUN 21  CREATININE 1.19*  CALCIUM 11.9*   Liver Function Tests: No results for input(s): "AST", "ALT", "ALKPHOS", "BILITOT", "PROT", "ALBUMIN" in the last 168 hours. CBG: No results for input(s): "GLUCAP" in the last 168 hours.  Discharge time spent: greater than 30 minutes.  Signed: Delfino Lovett, MD [facility] 11/07/2023

## 2023-11-09 LAB — URINE CULTURE: Culture: >=100000 — AB

## 2023-11-17 ENCOUNTER — Other Ambulatory Visit: Payer: Self-pay

## 2023-11-20 ENCOUNTER — Other Ambulatory Visit: Payer: Self-pay | Admitting: Urology

## 2023-11-20 DIAGNOSIS — N200 Calculus of kidney: Secondary | ICD-10-CM

## 2023-11-20 NOTE — Progress Notes (Deleted)
11/20/23 10:44 AM   Anne Steele 83/13/1942 [id number]  Referring provider:  Barbette Reichmann, MD [address] [facility] [place],  [place] [address]  Urological history:  1. Dysuria  -cysto 03/2019 NED   2. Urge incontinence  -failed OAB agents     3. Benign cyst of bladder dome  -resected 2019     4. Bilateral renal cysts  -last imaged 2019-benign   5. Bilateral renal calculi  -non-contrast CT (2022) Bilateral renal calculi  6. Vaginal atrophy  -managed vaginal estrogen cream three nights   No chief complaint on file.   HPI: Anne Steele is a 83 y.o.female who presents today for hospital follow-up.  Previous records reviewed.   She was admitted on November 07, 2023 after she was experiencing weakness leading to a fall in the bathroom.  She states that she had decreased oral intake for the past 2 days and felt like she had a UTI.  Urine culture was positive for Klebsiella.  She had a follow-up with her PCP ferry 25th 2025 and her UA was negative.   PMH: Past Medical History:  Diagnosis Date   Arthritis    Asthma    Bell palsy 1973   Bell's palsy    Benign bladder tumor    Depression    Fibromyalgia    GERD (gastroesophageal reflux disease)    Head ache    Hiatal hernia    History of kidney stones    Hypercalcemia    Hypertension    Kidney stones    Optic nerve disease    Shingles    Vitamin D deficiency     Surgical History: Past Surgical History:  Procedure Laterality Date   ABDOMINAL HYSTERECTOMY  1982   APPENDECTOMY     ARTERY BIOPSY N/A 02/24/2020   Procedure: BIOPSY TEMPORAL ARTERY;  Surgeon: Sung Amabile, DO;  Location: [facility] ORS;  Service: General;  Laterality: N/A;   BLADDER TUMOR EXCISION  08/2018   BREAST BIOPSY Right 03/04/2016   US biopsy/ INTRADUCTAL PAPILLOMA   BREAST BIOPSY Left 03/04/2016   Fibroadenoma   BREAST EXCISIONAL BIOPSY Left 03/05/2019   Inflamed sebaceous cyst of the left breast, excised  by dr. Lemar Livings in office BENIGN EPIDERMAL INCLUSION CYST WITH POSSIBLE RUPTURE AND ASSOCIATED INFLAMMATORY RESPONSE   CARPAL TUNNEL RELEASE Bilateral 2012   CATARACT EXTRACTION Bilateral 2017   CHOLECYSTECTOMY  2001   COLONOSCOPY     CYST EXCISION     FINGER   CYSTOSCOPY W/ RETROGRADES Bilateral 08/29/2018   Procedure: CYSTOSCOPY WITH RETROGRADE PYELOGRAM;  Surgeon: Vanna Scotland, MD;  Location: [facility] ORS;  Service: Urology;  Laterality: Bilateral;   CYSTOSCOPY WITH BIOPSY N/A 08/29/2018   Procedure: CYSTOSCOPY WITH Bladder BIOPSY;  Surgeon: Vanna Scotland, MD;  Location: [facility] ORS;  Service: Urology;  Laterality: N/A;   ESOPHAGOGASTRODUODENOSCOPY     EYE SURGERY     KNEE SURGERY Left 2012   REFRACTIVE SURGERY Bilateral    SIGMOIDOSCOPY      Home Medications:  Allergies as of 11/21/2023       Reactions   Ivp Dye [iodinated Contrast Media] Anaphylaxis   Penicillins Shortness Of Breath   Patient states it "brings her asthma back on"   Amitriptyline Other (See Comments)   Heavy chest   Ciprofloxacin Other (See Comments)   Duloxetine Other (See Comments)   nightmares   Iodine Itching   Latanoprost    Blood shot eyes   Tetracyclines & Related Swelling  Trusopt [dorzolamide Hcl]    Zocor [simvastatin]    MUSCLE CRAMPS   Zovirax [acyclovir]    Benzalkonium Chloride Rash   Betadine [povidone Iodine] Rash   Macrobid [nitrofurantoin] Rash   Sulfa Antibiotics Rash   Tape Rash        Medication List        Accurate as of November 20, 2023 10:44 AM. If you have any questions, ask your nurse or doctor.          acetaminophen 500 MG tablet Commonly known as: TYLENOL Take 1,000 mg by mouth 2 (two) times daily.   allopurinol 100 MG tablet Commonly known as: ZYLOPRIM Take 100 mg by mouth daily.   busPIRone 10 MG tablet Commonly known as: BUSPAR Take 10 mg by mouth 2 (two) times daily.   cyanocobalamin 1000 MCG tablet Take 100 mcg by mouth daily.   Eliquis 5 MG Tabs  tablet Generic drug: apixaban Take 1 tablet by mouth 2 (two) times daily.   enalapril 20 MG tablet Commonly known as: VASOTEC Take 20 mg by mouth daily.   fluticasone 50 MCG/ACT nasal spray Commonly known as: FLONASE Place 2 sprays into both nostrils daily.   gabapentin 100 MG capsule Commonly known as: NEURONTIN Take by mouth.   metoprolol succinate 25 MG 24 hr tablet Commonly known as: TOPROL-XL Take 25 mg by mouth daily.   nortriptyline 10 MG capsule Commonly known as: PAMELOR Take 10 mg by mouth at bedtime.   oxybutynin 5 MG tablet Commonly known as: DITROPAN Take 5 mg by mouth once. Once daily   pantoprazole 40 MG tablet Commonly known as: PROTONIX Take 40 mg by mouth daily.   rOPINIRole 0.5 MG tablet Commonly known as: REQUIP Take 0.5 mg by mouth at bedtime.   topiramate 25 MG tablet Commonly known as: TOPAMAX Take 25 mg by mouth daily.        Allergies:  Allergies  Allergen Reactions   Ivp Dye [Iodinated Contrast Media] Anaphylaxis   Penicillins Shortness Of Breath    Patient states it "brings her asthma back on"   Amitriptyline Other (See Comments)    Heavy chest   Ciprofloxacin Other (See Comments)   Duloxetine Other (See Comments)    nightmares   Iodine Itching   Latanoprost     Blood shot eyes   Tetracyclines & Related Swelling   Trusopt [Dorzolamide Hcl]    Zocor [Simvastatin]     MUSCLE CRAMPS   Zovirax [Acyclovir]    Benzalkonium Chloride Rash   Betadine [Povidone Iodine] Rash   Macrobid [Nitrofurantoin] Rash   Sulfa Antibiotics Rash   Tape Rash    Family History: Family History  Problem Relation Age of Onset   Leukemia Mother    Diabetes Father    Breast cancer Sister    Brain cancer Brother    Diabetes Sister    Diabetes Brother    Prostate cancer Neg Hx    Kidney cancer Neg Hx    Bladder Cancer Neg Hx     Social History:  reports that she has never smoked. She has never used smokeless tobacco. She reports that she  does not drink alcohol and does not use drugs.   Physical Exam: There were no vitals taken for this visit.  Constitutional:  Well nourished. Alert and oriented, No acute distress. HEENT: [place] AT, moist mucus membranes.  Trachea midline, no masses. Cardiovascular: No clubbing, cyanosis, or edema. Respiratory: Normal respiratory effort, no increased work of breathing. GU: No  CVA tenderness.  No bladder fullness or masses.  Recession of labia minora, dry, pale vulvar vaginal mucosa and loss of mucosal ridges and folds.  Normal urethral meatus, no lesions, no prolapse, no discharge.   No urethral masses, tenderness and/or tenderness. No bladder fullness, tenderness or masses. *** vagina mucosa, *** estrogen effect, no discharge, no lesions, *** pelvic support, *** cystocele and *** rectocele noted.  No cervical motion tenderness.  Uterus is freely mobile and non-fixed.  No adnexal/parametria masses or tenderness noted.  Anus and perineum are without rashes or lesions.   ***  Neurologic: Grossly intact, no focal deficits, moving all 4 extremities. Psychiatric: Normal mood and affect.    Laboratory Data: Component     Latest Ref Rng 11/06/2023  Hemoglobin A1C     4.8 - 5.6 % 5.4   Mean Plasma Glucose     mg/dL 540.98    CBC    Component Value Date/Time   WBC 12.7 (H) 11/06/2023 2028   RBC 4.14 11/06/2023 2028   HGB 13.0 11/06/2023 2028   HCT 39.1 11/06/2023 2028   PLT 271 11/06/2023 2028   MCV 94.4 11/06/2023 2028   MCH 31.4 11/06/2023 2028   MCHC 33.2 11/06/2023 2028   RDW 13.2 11/06/2023 2028    BMET    Component Value Date/Time   NA 138 11/06/2023 2028   K 4.0 11/06/2023 2028   CL 101 11/06/2023 2028   CO2 25 11/06/2023 2028   GLUCOSE 116 (H) 11/06/2023 2028   BUN 21 11/06/2023 2028   CREATININE 1.19 (H) 11/06/2023 2028   CREATININE 1.21 08/21/2013 1059   CALCIUM 11.9 (H) 11/06/2023 2028   GFRNONAA 46 (L) 11/06/2023 2028   GFRNONAA 45 (L) 08/21/2013 1059      Urinalysis Urinalysis w/Microscopic Order: [id number] Component Ref Range & Units 7 d ago  Color Colorless, Straw, Light Yellow, Yellow, Dark Yellow Colorless  Clarity Clear Clear  Specific Gravity 1.005 - 1.030 1.004 Low   pH, Urine 5.0 - 8.0 5.5  Protein, Urinalysis Negative mg/dL Negative  Glucose, Urinalysis Negative mg/dL Negative  Ketones, Urinalysis Negative mg/dL Negative  Blood, Urinalysis Negative Negative  Nitrite, Urinalysis Negative Negative  Leukocyte Esterase, Urinalysis Negative Trace Abnormal   Bilirubin, Urinalysis Negative Negative  Urobilinogen, Urinalysis 0.2 - 1.0 mg/dL 0.2  WBC, UA <=5 /hpf 2  Red Blood Cells, Urinalysis <=3 /hpf 0  Bacteria, Urinalysis 0 - 5 /hpf 0-5  Squamous Epithelial Cells, Urinalysis /hpf 0  Resulting Agency [facility]   Specimen Collected: 11/13/23 14:02   Performed by: [facility] Last Resulted: 11/13/23 14:29  Received From: [facility]  Result Received: 11/13/23 15:28  I have reviewed the labs.   Pertinent Imaging: N/A   Assessment & Plan:    1. Vaginal atrophy -encouraged her to apply the cream on Monday, Wednesday and Friday nights  -she continue to use vaginal lubricants daily for comfort -explained that the pelvic discomfort she is feeling may be due to the vaginal atrophy and to continue applying the cream and it will take up to 8 months to take effect   2. Mixed incontinence -refer to gynecology for pessary fitting to address her stress incontinence -asked her to bring her medication bottles with her next time so that we can determine what OAB agent she is taking  3. Nephrolithiasis -***  No follow-ups on file.  [facility] [address], [address] [place], [place] [address] [phone]  [phone]   

## 2023-11-21 ENCOUNTER — Ambulatory Visit: Payer: Medicare Other | Admitting: Urology

## 2023-11-27 NOTE — Progress Notes (Unsigned)
 11/28/23 2:43 PM   Anne Steele [birth year] [id number]  Referring provider:  Barbette Reichmann, MD [address] [facility] [address],  [place] [address]  Urological history:  1. Dysuria  -cysto 03/2019 NED   2. Urge incontinence  -failed OAB agents     3. Benign cyst of bladder dome  -resected 2019     4. Bilateral renal cysts  -last imaged 2019-benign   5. Bilateral renal calculi  -non-contrast CT (2022) Bilateral renal calculi  6. Vaginal atrophy  -managed vaginal estrogen cream three nights   Chief Complaint  Patient presents with   Nephrolithiasis    HPI: Anne Steele is a 83 y.o.female who presents today for hospital follow-up with her daughter, Anne Steele.   Previous records reviewed.   She was admitted on November 07, 2023 after she was experiencing weakness leading to a fall in the bathroom.  She states that she had decreased oral intake for the past 2 days and felt like she had a UTI.  Urine culture was positive for Klebsiella.  She had a follow-up with her PCP February 25th 2025 and her UA was negative.  KUB stable left sided stones  She continues to have issues with urinary continence.  She is on oxybutynin 5 mg daily and it is not effective.  She does have issues with dry mouth.  She wears depends daily, sometimes soaking them.   The incontinence occurs when she stands or gets the urge to urinate.  Patient denies any modifying or aggravating factors.  Patient denies any recent UTI's, gross hematuria, dysuria or suprapubic/flank pain.  Patient denies any fevers, chills, nausea or vomiting.    PMH: Past Medical History:  Diagnosis Date   Arthritis    Asthma    Bell palsy 1973   Bell's palsy    Benign bladder tumor    Depression    Fibromyalgia    GERD (gastroesophageal reflux disease)    Head ache    Hiatal hernia    History of kidney stones    Hypercalcemia    Hypertension    Kidney stones    Optic nerve disease     Shingles    Vitamin D deficiency     Surgical History: Past Surgical History:  Procedure Laterality Date   ABDOMINAL HYSTERECTOMY  1982   APPENDECTOMY     ARTERY BIOPSY N/A 02/24/2020   Procedure: BIOPSY TEMPORAL ARTERY;  Surgeon: Sung Amabile, DO;  Location: [facility];  Service: General;  Laterality: N/A;   BLADDER TUMOR EXCISION  08/2018   BREAST BIOPSY Right 03/04/2016   US biopsy/ INTRADUCTAL PAPILLOMA   BREAST BIOPSY Left 03/04/2016   Fibroadenoma   BREAST EXCISIONAL BIOPSY Left 03/05/2019   Inflamed sebaceous cyst of the left breast, excised by dr. Lemar Livings in office BENIGN EPIDERMAL INCLUSION CYST WITH POSSIBLE RUPTURE AND ASSOCIATED INFLAMMATORY RESPONSE   CARPAL TUNNEL RELEASE Bilateral 2012   CATARACT EXTRACTION Bilateral 2017   CHOLECYSTECTOMY  2001   COLONOSCOPY     CYST EXCISION     FINGER   CYSTOSCOPY W/ RETROGRADES Bilateral 08/29/2018   Procedure: CYSTOSCOPY WITH RETROGRADE PYELOGRAM;  Surgeon: Vanna Scotland, MD;  Location: [facility];  Service: Urology;  Laterality: Bilateral;   CYSTOSCOPY WITH BIOPSY N/A 08/29/2018   Procedure: CYSTOSCOPY WITH Bladder BIOPSY;  Surgeon: Vanna Scotland, MD;  Location: [facility];  Service: Urology;  Laterality: N/A;   ESOPHAGOGASTRODUODENOSCOPY     EYE SURGERY     KNEE SURGERY Left 2012  REFRACTIVE SURGERY Bilateral    SIGMOIDOSCOPY      Home Medications:  Allergies as of 11/28/2023       Reactions   Ivp Dye [iodinated Contrast Media] Anaphylaxis   Penicillins Shortness Of Breath   Patient states it "brings her asthma back on"   Amitriptyline Other (See Comments)   Heavy chest   Ciprofloxacin Other (See Comments)   Duloxetine Other (See Comments)   nightmares   Iodine Itching   Latanoprost    Blood shot eyes   Tetracyclines & Related Swelling   Trusopt [dorzolamide Hcl]    Zocor [simvastatin]    MUSCLE CRAMPS   Zovirax [acyclovir]    Benzalkonium Chloride Rash   Betadine [povidone Iodine] Rash   Macrobid  [nitrofurantoin] Rash   Sulfa Antibiotics Rash   Tape Rash        Medication List        Accurate as of November 28, 2023  2:43 PM. If you have any questions, ask your nurse or doctor.          STOP taking these medications    Eliquis 5 MG Tabs tablet Generic drug: apixaban   fluticasone 50 MCG/ACT nasal spray Commonly known as: FLONASE   gabapentin 100 MG capsule Commonly known as: NEURONTIN   metoprolol succinate 25 MG 24 hr tablet Commonly known as: TOPROL-XL   nortriptyline 10 MG capsule Commonly known as: PAMELOR   oxybutynin 5 MG tablet Commonly known as: DITROPAN Replaced by: oxybutynin 10 MG 24 hr tablet       TAKE these medications    acetaminophen 500 MG tablet Commonly known as: TYLENOL Take 1,000 mg by mouth 2 (two) times daily.   allopurinol 100 MG tablet Commonly known as: ZYLOPRIM Take 100 mg by mouth daily.   busPIRone 10 MG tablet Commonly known as: BUSPAR Take 10 mg by mouth 2 (two) times daily.   cyanocobalamin 1000 MCG tablet Take 100 mcg by mouth daily.   enalapril 20 MG tablet Commonly known as: VASOTEC Take 20 mg by mouth daily.   oxybutynin 10 MG 24 hr tablet Commonly known as: DITROPAN-XL Take 1 tablet (10 mg total) by mouth daily. Replaces: oxybutynin 5 MG tablet   pantoprazole 40 MG tablet Commonly known as: PROTONIX Take 40 mg by mouth daily.   rOPINIRole 0.5 MG tablet Commonly known as: REQUIP Take 0.5 mg by mouth at bedtime.   topiramate 25 MG tablet Commonly known as: TOPAMAX Take 25 mg by mouth daily.        Allergies:  Allergies  Allergen Reactions   Ivp Dye [Iodinated Contrast Media] Anaphylaxis   Penicillins Shortness Of Breath    Patient states it "brings her asthma back on"   Amitriptyline Other (See Comments)    Heavy chest   Ciprofloxacin Other (See Comments)   Duloxetine Other (See Comments)    nightmares   Iodine Itching   Latanoprost     Blood shot eyes   Tetracyclines & Related  Swelling   Trusopt [Dorzolamide Hcl]    Zocor [Simvastatin]     MUSCLE CRAMPS   Zovirax [Acyclovir]    Benzalkonium Chloride Rash   Betadine [Povidone Iodine] Rash   Macrobid [Nitrofurantoin] Rash   Sulfa Antibiotics Rash   Tape Rash    Family History: Family History  Problem Relation Age of Onset   Leukemia Mother    Diabetes Father    Breast cancer Sister    Brain cancer Brother    Diabetes Sister  Diabetes Brother    Prostate cancer Neg Hx    Kidney cancer Neg Hx    Bladder Cancer Neg Hx     Social History:  reports that she has never smoked. She has never used smokeless tobacco. She reports that she does not drink alcohol and does not use drugs.   Physical Exam: BP 107/69   Pulse 69   Ht 4\' 11"  (1.499 m)   Wt 151 lb (68.5 kg)   BMI 30.50 kg/m   Constitutional:  Well nourished. Alert and oriented, No acute distress. HEENT: [place] AT, moist mucus membranes.  Trachea midline Cardiovascular: No clubbing, cyanosis, or edema. Neurologic: Grossly intact, no focal deficits, moving all 4 extremities. Psychiatric: Normal mood and affect.    Laboratory Data: Component     Latest Ref Rng 11/06/2023  Hemoglobin A1C     4.8 - 5.6 % 5.4   Mean Plasma Glucose     mg/dL 161.09    CBC    Component Value Date/Time   WBC 12.7 (H) 11/06/2023 2028   RBC 4.14 11/06/2023 2028   HGB 13.0 11/06/2023 2028   HCT 39.1 11/06/2023 2028   PLT 271 11/06/2023 2028   MCV 94.4 11/06/2023 2028   MCH 31.4 11/06/2023 2028   MCHC 33.2 11/06/2023 2028   RDW 13.2 11/06/2023 2028    BMET    Component Value Date/Time   NA 138 11/06/2023 2028   K 4.0 11/06/2023 2028   CL 101 11/06/2023 2028   CO2 25 11/06/2023 2028   GLUCOSE 116 (H) 11/06/2023 2028   BUN 21 11/06/2023 2028   CREATININE 1.19 (H) 11/06/2023 2028   CREATININE 1.21 08/21/2013 1059   CALCIUM 11.9 (H) 11/06/2023 2028   GFRNONAA 46 (L) 11/06/2023 2028   GFRNONAA 45 (L) 08/21/2013 1059     Urinalysis Urinalysis  w/Microscopic Order: [id number] Component Ref Range & Units 7 d ago  Color Colorless, Straw, Light Yellow, Yellow, Dark Yellow Colorless  Clarity Clear Clear  Specific Gravity 1.005 - 1.030 1.004 Low   pH, Urine 5.0 - 8.0 5.5  Protein, Urinalysis Negative mg/dL Negative  Glucose, Urinalysis Negative mg/dL Negative  Ketones, Urinalysis Negative mg/dL Negative  Blood, Urinalysis Negative Negative  Nitrite, Urinalysis Negative Negative  Leukocyte Esterase, Urinalysis Negative Trace Abnormal   Bilirubin, Urinalysis Negative Negative  Urobilinogen, Urinalysis 0.2 - 1.0 mg/dL 0.2  WBC, UA <=5 /hpf 2  Red Blood Cells, Urinalysis <=3 /hpf 0  Bacteria, Urinalysis 0 - 5 /hpf 0-5  Squamous Epithelial Cells, Urinalysis /hpf 0  Resulting Agency [facility]   Specimen Collected: 11/13/23 14:02   Performed by: [facility] Last Resulted: 11/13/23 14:29  Received From: [facility]  Result Received: 11/13/23 15:28  I have reviewed the labs.   Pertinent Imaging: N/A   Assessment & Plan:    1. Vaginal atrophy -she is no longer using the vaginal estrogen cream  2. Mixed incontinence -She is not finding the oxybutynin 5 mg daily effective, so we will increase it to oxybutynin XL 10 mg daily -She will return in 6 weeks for reassessment -She will discontinue the medication and contact me if it causes a increase in her dry mouth  3. Nephrolithiasis -stable left renal stones on KUB  4. Klebsiella pneumoniae UTI  -likely colonized, but treated during hospitalization after suffering a fall -Will obtain a renal ultrasound for evaluation for nidus   Return in about 6 weeks (around 01/09/2024) for RUS  report, OAB, PVR .  [facility] [address] [place], [place] [address] [phone]

## 2023-11-28 ENCOUNTER — Encounter: Payer: Self-pay | Admitting: Urology

## 2023-11-28 ENCOUNTER — Ambulatory Visit
Admission: RE | Admit: 2023-11-28 | Discharge: 2023-11-28 | Disposition: A | Discharge status: Home / Self Care | Attending: Urology | Admitting: Urology

## 2023-11-28 ENCOUNTER — Ambulatory Visit: Admitting: Urology

## 2023-11-28 ENCOUNTER — Ambulatory Visit
Admission: RE | Admit: 2023-11-28 | Discharge: 2023-11-28 | Disposition: A | Discharge status: Home / Self Care | Source: Ambulatory Visit | Attending: Urology | Admitting: Urology

## 2023-11-28 VITALS — BP 107/69 | HR 69 | Ht 59.0 in | Wt 151.0 lb

## 2023-11-28 DIAGNOSIS — N3946 Mixed incontinence: Secondary | ICD-10-CM | POA: Diagnosis not present

## 2023-11-28 DIAGNOSIS — N200 Calculus of kidney: Secondary | ICD-10-CM | POA: Diagnosis not present

## 2023-11-28 DIAGNOSIS — N390 Urinary tract infection, site not specified: Secondary | ICD-10-CM | POA: Diagnosis not present

## 2023-11-28 DIAGNOSIS — N952 Postmenopausal atrophic vaginitis: Secondary | ICD-10-CM

## 2023-11-28 MED ORDER — OXYBUTYNIN CHLORIDE ER 10 MG PO TB24: 10.0000 mg | ORAL_TABLET | Freq: Every day | ORAL | 3 refills | Status: DC

## 2023-12-20 ENCOUNTER — Other Ambulatory Visit: Payer: Self-pay | Admitting: Nephrology

## 2023-12-20 DIAGNOSIS — R809 Proteinuria, unspecified: Secondary | ICD-10-CM

## 2023-12-20 DIAGNOSIS — N1832 Chronic kidney disease, stage 3b: Secondary | ICD-10-CM

## 2023-12-20 DIAGNOSIS — E1122 Type 2 diabetes mellitus with diabetic chronic kidney disease: Secondary | ICD-10-CM

## 2023-12-29 ENCOUNTER — Ambulatory Visit

## 2024-01-16 ENCOUNTER — Ambulatory Visit: Admitting: Urology

## 2024-01-16 ENCOUNTER — Other Ambulatory Visit: Payer: Self-pay | Admitting: Urology

## 2024-01-17 ENCOUNTER — Encounter: Payer: Self-pay | Admitting: Urology

## 2024-01-22 ENCOUNTER — Ambulatory Visit
Admission: RE | Admit: 2024-01-22 | Discharge: 2024-01-22 | Disposition: A | Discharge status: Home / Self Care | Source: Ambulatory Visit | Attending: Nephrology | Admitting: Nephrology

## 2024-01-22 DIAGNOSIS — N1832 Chronic kidney disease, stage 3b: Secondary | ICD-10-CM | POA: Diagnosis present

## 2024-01-22 DIAGNOSIS — R809 Proteinuria, unspecified: Secondary | ICD-10-CM | POA: Diagnosis present

## 2024-01-22 DIAGNOSIS — E1122 Type 2 diabetes mellitus with diabetic chronic kidney disease: Secondary | ICD-10-CM | POA: Insufficient documentation

## 2024-01-24 ENCOUNTER — Other Ambulatory Visit: Payer: Self-pay | Admitting: Urology

## 2024-04-17 ENCOUNTER — Other Ambulatory Visit: Payer: Self-pay | Admitting: Urology

## 2024-06-14 LAB — COLOGUARD: COLOGUARD: NEGATIVE

## 2024-07-04 ENCOUNTER — Other Ambulatory Visit: Payer: Self-pay | Admitting: Internal Medicine

## 2024-07-04 DIAGNOSIS — Z1231 Encounter for screening mammogram for malignant neoplasm of breast: Secondary | ICD-10-CM

## 2024-07-10 ENCOUNTER — Other Ambulatory Visit: Payer: Self-pay | Admitting: Nephrology

## 2024-07-10 DIAGNOSIS — N1832 Chronic kidney disease, stage 3b: Secondary | ICD-10-CM

## 2024-07-10 DIAGNOSIS — E213 Hyperparathyroidism, unspecified: Secondary | ICD-10-CM

## 2024-07-26 ENCOUNTER — Encounter: Payer: Self-pay | Admitting: Radiology

## 2024-07-31 ENCOUNTER — Other Ambulatory Visit: Payer: Self-pay | Admitting: Urology

## 2024-08-08 ENCOUNTER — Ambulatory Visit
Admission: RE | Admit: 2024-08-08 | Discharge: 2024-08-08 | Disposition: A | Discharge status: Home / Self Care | Source: Ambulatory Visit | Attending: Internal Medicine | Admitting: Internal Medicine

## 2024-08-08 DIAGNOSIS — Z1231 Encounter for screening mammogram for malignant neoplasm of breast: Secondary | ICD-10-CM | POA: Diagnosis present

## 2024-08-20 ENCOUNTER — Other Ambulatory Visit: Payer: Self-pay | Admitting: Infectious Diseases

## 2024-08-20 DIAGNOSIS — R928 Other abnormal and inconclusive findings on diagnostic imaging of breast: Secondary | ICD-10-CM

## 2024-09-02 ENCOUNTER — Inpatient Hospital Stay
Admission: RE | Admit: 2024-09-02 | Discharge: 2024-09-02 | Disposition: A | Source: Ambulatory Visit | Attending: Infectious Diseases | Admitting: Infectious Diseases

## 2024-09-02 ENCOUNTER — Other Ambulatory Visit: Payer: Self-pay | Admitting: Infectious Diseases

## 2024-09-02 ENCOUNTER — Inpatient Hospital Stay
Admission: RE | Admit: 2024-09-02 | Discharge: 2024-09-02 | Attending: Infectious Diseases | Admitting: Infectious Diseases

## 2024-09-02 DIAGNOSIS — R928 Other abnormal and inconclusive findings on diagnostic imaging of breast: Secondary | ICD-10-CM

## 2024-09-06 ENCOUNTER — Inpatient Hospital Stay
Admission: RE | Admit: 2024-09-06 | Discharge: 2024-09-06 | Attending: Infectious Diseases | Admitting: Infectious Diseases

## 2024-09-06 ENCOUNTER — Ambulatory Visit
Admission: RE | Admit: 2024-09-06 | Discharge: 2024-09-06 | Disposition: A | Discharge status: Home / Self Care | Source: Ambulatory Visit | Attending: Infectious Diseases | Admitting: Infectious Diseases

## 2024-09-06 DIAGNOSIS — R928 Other abnormal and inconclusive findings on diagnostic imaging of breast: Secondary | ICD-10-CM | POA: Diagnosis present

## 2024-09-06 DIAGNOSIS — N6489 Other specified disorders of breast: Secondary | ICD-10-CM | POA: Diagnosis present

## 2024-09-06 HISTORY — PX: BREAST BIOPSY: SHX20

## 2024-09-06 MED ORDER — LIDOCAINE-EPINEPHRINE 1 %-1:100000 IJ SOLN
8.0000 mL | Freq: Once | INTRAMUSCULAR | Status: AC
  Administered 2024-09-06: 8 mL via INTRADERMAL

## 2024-09-06 MED ORDER — LIDOCAINE 1 % OPTIME INJ - NO CHARGE
2.0000 mL | Freq: Once | INTRAMUSCULAR | Status: AC
  Administered 2024-09-06: 2 mL via INTRADERMAL
  Filled 2024-09-06: qty 2

## 2024-09-09 LAB — SURGICAL PATHOLOGY

## 2024-10-16 ENCOUNTER — Other Ambulatory Visit: Payer: Self-pay | Admitting: Internal Medicine

## 2024-10-16 DIAGNOSIS — N63 Unspecified lump in unspecified breast: Secondary | ICD-10-CM
